# Patient Record
Sex: Female | Born: 1950
Health system: Southern US, Community
[De-identification: ages and names within clinical notes are randomized; demographics above are authoritative.]

## PROBLEM LIST (undated history)

## (undated) DIAGNOSIS — I10 Essential (primary) hypertension: Secondary | ICD-10-CM

## (undated) DIAGNOSIS — G43109 Migraine with aura, not intractable, without status migrainosus: Secondary | ICD-10-CM

## (undated) DIAGNOSIS — K760 Fatty (change of) liver, not elsewhere classified: Secondary | ICD-10-CM

## (undated) DIAGNOSIS — R112 Nausea with vomiting, unspecified: Secondary | ICD-10-CM

## (undated) DIAGNOSIS — D126 Benign neoplasm of colon, unspecified: Secondary | ICD-10-CM

## (undated) DIAGNOSIS — K648 Other hemorrhoids: Secondary | ICD-10-CM

## (undated) DIAGNOSIS — D649 Anemia, unspecified: Secondary | ICD-10-CM

## (undated) DIAGNOSIS — E785 Hyperlipidemia, unspecified: Secondary | ICD-10-CM

## (undated) DIAGNOSIS — K219 Gastro-esophageal reflux disease without esophagitis: Secondary | ICD-10-CM

## (undated) DIAGNOSIS — K31819 Angiodysplasia of stomach and duodenum without bleeding: Secondary | ICD-10-CM

## (undated) DIAGNOSIS — K589 Irritable bowel syndrome without diarrhea: Secondary | ICD-10-CM

## (undated) DIAGNOSIS — B181 Chronic viral hepatitis B without delta-agent: Secondary | ICD-10-CM

## (undated) DIAGNOSIS — T7840XA Allergy, unspecified, initial encounter: Secondary | ICD-10-CM

## (undated) DIAGNOSIS — B001 Herpesviral vesicular dermatitis: Secondary | ICD-10-CM

## (undated) DIAGNOSIS — M519 Unspecified thoracic, thoracolumbar and lumbosacral intervertebral disc disorder: Secondary | ICD-10-CM

## (undated) DIAGNOSIS — Z9889 Other specified postprocedural states: Secondary | ICD-10-CM

## (undated) DIAGNOSIS — Z5189 Encounter for other specified aftercare: Secondary | ICD-10-CM

## (undated) HISTORY — DX: Other hemorrhoids: K64.8

## (undated) HISTORY — DX: Allergy, unspecified, initial encounter: T78.40XA

## (undated) HISTORY — PX: ESOPHAGOGASTRODUODENOSCOPY ENDOSCOPY: SHX5814

## (undated) HISTORY — DX: Angiodysplasia of stomach and duodenum without bleeding: K31.819

## (undated) HISTORY — DX: Fatty (change of) liver, not elsewhere classified: K76.0

## (undated) HISTORY — DX: Migraine with aura, not intractable, without status migrainosus: G43.109

## (undated) HISTORY — PX: NO PAST SURGERIES: SHX2092

## (undated) HISTORY — DX: Chronic viral hepatitis B without delta-agent: B18.1

## (undated) HISTORY — DX: Anemia, unspecified: D64.9

## (undated) HISTORY — DX: Irritable bowel syndrome, unspecified: K58.9

## (undated) HISTORY — DX: Unspecified thoracic, thoracolumbar and lumbosacral intervertebral disc disorder: M51.9

## (undated) HISTORY — DX: Gastro-esophageal reflux disease without esophagitis: K21.9

## (undated) HISTORY — DX: Essential (primary) hypertension: I10

## (undated) HISTORY — DX: Benign neoplasm of colon, unspecified: D12.6

## (undated) HISTORY — DX: Hyperlipidemia, unspecified: E78.5

## (undated) HISTORY — DX: Herpesviral vesicular dermatitis: B00.1

## (undated) HISTORY — DX: Encounter for other specified aftercare: Z51.89

---

## 2000-03-04 ENCOUNTER — Emergency Department (HOSPITAL_COMMUNITY): Admission: EM | Admit: 2000-03-04 | Discharge: 2000-03-05 | Payer: Self-pay | Admitting: Emergency Medicine

## 2000-03-10 ENCOUNTER — Other Ambulatory Visit: Admission: RE | Admit: 2000-03-10 | Discharge: 2000-03-10 | Payer: Self-pay | Admitting: Gynecology

## 2000-08-29 ENCOUNTER — Ambulatory Visit (HOSPITAL_COMMUNITY): Admission: RE | Admit: 2000-08-29 | Discharge: 2000-08-29 | Payer: Self-pay | Admitting: *Deleted

## 2000-08-29 ENCOUNTER — Encounter (INDEPENDENT_AMBULATORY_CARE_PROVIDER_SITE_OTHER): Payer: Self-pay

## 2002-08-13 ENCOUNTER — Encounter: Admission: RE | Admit: 2002-08-13 | Discharge: 2002-08-13 | Payer: Self-pay | Admitting: *Deleted

## 2002-08-13 ENCOUNTER — Encounter: Payer: Self-pay | Admitting: *Deleted

## 2002-09-30 ENCOUNTER — Encounter: Payer: Self-pay | Admitting: Emergency Medicine

## 2002-09-30 ENCOUNTER — Emergency Department (HOSPITAL_COMMUNITY): Admission: EM | Admit: 2002-09-30 | Discharge: 2002-09-30 | Payer: Self-pay | Admitting: Emergency Medicine

## 2002-10-25 ENCOUNTER — Emergency Department (HOSPITAL_COMMUNITY): Admission: EM | Admit: 2002-10-25 | Discharge: 2002-10-25 | Payer: Self-pay

## 2002-10-25 ENCOUNTER — Encounter: Payer: Self-pay | Admitting: Emergency Medicine

## 2003-07-12 ENCOUNTER — Encounter: Admission: RE | Admit: 2003-07-12 | Discharge: 2003-08-02 | Payer: Self-pay | Admitting: Occupational Medicine

## 2003-11-15 ENCOUNTER — Emergency Department (HOSPITAL_COMMUNITY): Admission: EM | Admit: 2003-11-15 | Discharge: 2003-11-16 | Payer: Self-pay | Admitting: Emergency Medicine

## 2004-01-09 ENCOUNTER — Ambulatory Visit (HOSPITAL_COMMUNITY): Admission: RE | Admit: 2004-01-09 | Discharge: 2004-01-09 | Payer: Self-pay | Admitting: *Deleted

## 2004-05-28 ENCOUNTER — Ambulatory Visit: Payer: Self-pay | Admitting: Internal Medicine

## 2004-05-30 ENCOUNTER — Emergency Department (HOSPITAL_COMMUNITY): Admission: EM | Admit: 2004-05-30 | Discharge: 2004-05-30 | Payer: Self-pay | Admitting: Emergency Medicine

## 2004-06-29 ENCOUNTER — Encounter: Admission: RE | Admit: 2004-06-29 | Discharge: 2004-06-29 | Payer: Self-pay | Admitting: Obstetrics and Gynecology

## 2004-08-02 ENCOUNTER — Other Ambulatory Visit: Admission: RE | Admit: 2004-08-02 | Discharge: 2004-08-02 | Payer: Self-pay | Admitting: Obstetrics and Gynecology

## 2004-09-07 ENCOUNTER — Ambulatory Visit (HOSPITAL_COMMUNITY): Admission: RE | Admit: 2004-09-07 | Discharge: 2004-09-07 | Payer: Self-pay | Admitting: Obstetrics and Gynecology

## 2004-10-29 ENCOUNTER — Emergency Department (HOSPITAL_COMMUNITY): Admission: EM | Admit: 2004-10-29 | Discharge: 2004-10-29 | Payer: Self-pay | Admitting: Emergency Medicine

## 2004-11-29 ENCOUNTER — Emergency Department (HOSPITAL_COMMUNITY): Admission: EM | Admit: 2004-11-29 | Discharge: 2004-11-29 | Payer: Self-pay | Admitting: Family Medicine

## 2005-09-18 ENCOUNTER — Ambulatory Visit (HOSPITAL_COMMUNITY): Admission: RE | Admit: 2005-09-18 | Discharge: 2005-09-18 | Payer: Self-pay | Admitting: Obstetrics and Gynecology

## 2005-12-27 ENCOUNTER — Emergency Department (HOSPITAL_COMMUNITY): Admission: EM | Admit: 2005-12-27 | Discharge: 2005-12-27 | Payer: Self-pay | Admitting: Emergency Medicine

## 2006-03-12 ENCOUNTER — Emergency Department (HOSPITAL_COMMUNITY): Admission: EM | Admit: 2006-03-12 | Discharge: 2006-03-12 | Payer: Self-pay | Admitting: Emergency Medicine

## 2006-08-21 ENCOUNTER — Emergency Department (HOSPITAL_COMMUNITY): Admission: EM | Admit: 2006-08-21 | Discharge: 2006-08-21 | Payer: Self-pay | Admitting: Emergency Medicine

## 2006-09-18 ENCOUNTER — Ambulatory Visit: Payer: Self-pay | Admitting: Internal Medicine

## 2006-09-18 LAB — CONVERTED CEMR LAB
ALT: 24 units/L (ref 0–40)
AST: 27 units/L (ref 0–37)
Albumin: 4.1 g/dL (ref 3.5–5.2)
Alkaline Phosphatase: 57 units/L (ref 39–117)
BUN: 15 mg/dL (ref 6–23)
Bacteria, UA: NEGATIVE
Basophils Absolute: 0 10*3/uL (ref 0.0–0.1)
Basophils Relative: 0.1 % (ref 0.0–1.0)
Bilirubin Urine: NEGATIVE
Bilirubin, Direct: 0.1 mg/dL (ref 0.0–0.3)
CO2: 33 meq/L — ABNORMAL HIGH (ref 19–32)
Calcium: 9.2 mg/dL (ref 8.4–10.5)
Chloride: 106 meq/L (ref 96–112)
Cholesterol: 222 mg/dL (ref 0–200)
Creatinine, Ser: 0.8 mg/dL (ref 0.4–1.2)
Crystals: NEGATIVE
Direct LDL: 129.3 mg/dL
Eosinophils Absolute: 0.2 10*3/uL (ref 0.0–0.6)
Eosinophils Relative: 3.5 % (ref 0.0–5.0)
GFR calc Af Amer: 96 mL/min
GFR calc non Af Amer: 80 mL/min
Glucose, Bld: 89 mg/dL (ref 70–99)
HCT: 37.9 % (ref 36.0–46.0)
HDL: 61.8 mg/dL (ref >39.0)
Hemoglobin: 12.6 g/dL (ref 12.0–15.0)
Ketones, ur: NEGATIVE mg/dL
Leukocytes, UA: NEGATIVE
Lymphocytes Relative: 36 % (ref 12.0–46.0)
MCHC: 33.1 g/dL (ref 30.0–36.0)
MCV: 71.8 fL — ABNORMAL LOW (ref 78.0–100.0)
Monocytes Absolute: 0.5 10*3/uL (ref 0.2–0.7)
Monocytes Relative: 10.2 % (ref 3.0–11.0)
Neutro Abs: 2.4 10*3/uL (ref 1.4–7.7)
Neutrophils Relative %: 50.2 % (ref 43.0–77.0)
Nitrite: NEGATIVE
Platelets: 182 10*3/uL (ref 150–400)
Potassium: 3.8 meq/L (ref 3.5–5.1)
RBC: 5.28 M/uL — ABNORMAL HIGH (ref 3.87–5.11)
RDW: 17.3 % — ABNORMAL HIGH (ref 11.5–14.6)
Sodium: 143 meq/L (ref 135–145)
Specific Gravity, Urine: >=1.03 (ref 1.000–1.03)
TSH: 1.21 microintl units/mL (ref 0.35–5.50)
Total Bilirubin: 0.7 mg/dL (ref 0.3–1.2)
Total CHOL/HDL Ratio: 3.6
Total Protein, Urine: NEGATIVE mg/dL
Total Protein: 7.7 g/dL (ref 6.0–8.3)
Triglycerides: 204 mg/dL (ref 0–149)
Urine Glucose: NEGATIVE mg/dL
Urobilinogen, UA: 0.2 (ref 0.0–1.0)
VLDL: 41 mg/dL — ABNORMAL HIGH (ref 0–40)
WBC: 4.8 10*3/uL (ref 4.5–10.5)
pH: 6 (ref 5.0–8.0)

## 2006-09-29 ENCOUNTER — Ambulatory Visit (HOSPITAL_COMMUNITY): Admission: RE | Admit: 2006-09-29 | Discharge: 2006-09-29 | Payer: Self-pay | Admitting: Obstetrics and Gynecology

## 2006-10-01 ENCOUNTER — Ambulatory Visit: Payer: Self-pay | Admitting: Gastroenterology

## 2007-10-06 ENCOUNTER — Ambulatory Visit (HOSPITAL_COMMUNITY): Admission: RE | Admit: 2007-10-06 | Discharge: 2007-10-06 | Payer: Self-pay | Admitting: Obstetrics and Gynecology

## 2007-11-10 ENCOUNTER — Other Ambulatory Visit: Admission: RE | Admit: 2007-11-10 | Discharge: 2007-11-10 | Payer: Self-pay | Admitting: Obstetrics and Gynecology

## 2007-12-17 ENCOUNTER — Telehealth (INDEPENDENT_AMBULATORY_CARE_PROVIDER_SITE_OTHER): Payer: Self-pay | Admitting: *Deleted

## 2008-03-31 ENCOUNTER — Emergency Department (HOSPITAL_COMMUNITY): Admission: EM | Admit: 2008-03-31 | Discharge: 2008-04-01 | Payer: Self-pay | Admitting: *Deleted

## 2008-11-11 ENCOUNTER — Encounter: Payer: Self-pay | Admitting: Internal Medicine

## 2008-11-14 ENCOUNTER — Encounter: Admission: RE | Admit: 2008-11-14 | Discharge: 2008-11-14 | Payer: Self-pay | Admitting: Gastroenterology

## 2009-06-16 ENCOUNTER — Encounter: Payer: Self-pay | Admitting: Internal Medicine

## 2009-07-03 ENCOUNTER — Encounter: Admission: RE | Admit: 2009-07-03 | Discharge: 2009-07-03 | Payer: Self-pay | Admitting: Gastroenterology

## 2009-07-04 ENCOUNTER — Encounter: Payer: Self-pay | Admitting: Internal Medicine

## 2010-03-19 ENCOUNTER — Observation Stay (HOSPITAL_COMMUNITY): Admission: EM | Admit: 2010-03-19 | Discharge: 2010-03-20 | Payer: Self-pay | Admitting: Emergency Medicine

## 2010-03-27 ENCOUNTER — Ambulatory Visit: Payer: Self-pay | Admitting: Internal Medicine

## 2010-03-27 LAB — CONVERTED CEMR LAB
ALT: 22 units/L (ref 0–35)
AST: 23 units/L (ref 0–37)
Albumin: 4.3 g/dL (ref 3.5–5.2)
Alkaline Phosphatase: 72 units/L (ref 39–117)
BUN: 18 mg/dL (ref 6–23)
Basophils Absolute: 0 10*3/uL (ref 0.0–0.1)
Basophils Relative: 0.5 % (ref 0.0–3.0)
Bilirubin Urine: NEGATIVE
Bilirubin, Direct: 0.1 mg/dL (ref 0.0–0.3)
CO2: 32 meq/L (ref 19–32)
Calcium: 10 mg/dL (ref 8.4–10.5)
Chloride: 105 meq/L (ref 96–112)
Cholesterol: 239 mg/dL — ABNORMAL HIGH (ref 0–200)
Creatinine, Ser: 0.9 mg/dL (ref 0.4–1.2)
Direct LDL: 141.5 mg/dL
Eosinophils Absolute: 0.1 10*3/uL (ref 0.0–0.7)
Eosinophils Relative: 2.9 % (ref 0.0–5.0)
GFR calc non Af Amer: 66.85 mL/min (ref >60)
Glucose, Bld: 76 mg/dL (ref 70–99)
HCT: 41.9 % (ref 36.0–46.0)
HDL: 61.7 mg/dL (ref >39.00)
Hemoglobin, Urine: NEGATIVE
Hemoglobin: 14.2 g/dL (ref 12.0–15.0)
Ketones, ur: NEGATIVE mg/dL
Leukocytes, UA: NEGATIVE
Lymphocytes Relative: 40.7 % (ref 12.0–46.0)
Lymphs Abs: 1.7 10*3/uL (ref 0.7–4.0)
MCHC: 33.8 g/dL (ref 30.0–36.0)
MCV: 81.5 fL (ref 78.0–100.0)
Monocytes Absolute: 0.2 10*3/uL (ref 0.1–1.0)
Monocytes Relative: 6 % (ref 3.0–12.0)
Neutro Abs: 2.1 10*3/uL (ref 1.4–7.7)
Neutrophils Relative %: 49.9 % (ref 43.0–77.0)
Nitrite: NEGATIVE
Platelets: 148 10*3/uL — ABNORMAL LOW (ref 150.0–400.0)
Potassium: 5.3 meq/L — ABNORMAL HIGH (ref 3.5–5.1)
RBC: 5.14 M/uL — ABNORMAL HIGH (ref 3.87–5.11)
RDW: 13.5 % (ref 11.5–14.6)
Sodium: 142 meq/L (ref 135–145)
Specific Gravity, Urine: <=1.005 (ref 1.000–1.030)
TSH: 1.15 microintl units/mL (ref 0.35–5.50)
Total Bilirubin: 0.9 mg/dL (ref 0.3–1.2)
Total CHOL/HDL Ratio: 4
Total Protein, Urine: NEGATIVE mg/dL
Total Protein: 7.4 g/dL (ref 6.0–8.3)
Triglycerides: 157 mg/dL — ABNORMAL HIGH (ref 0.0–149.0)
Urine Glucose: NEGATIVE mg/dL
Urobilinogen, UA: 0.2 (ref 0.0–1.0)
VLDL: 31.4 mg/dL (ref 0.0–40.0)
WBC: 4.1 10*3/uL — ABNORMAL LOW (ref 4.5–10.5)
pH: 8 (ref 5.0–8.0)

## 2010-03-28 ENCOUNTER — Ambulatory Visit: Payer: Self-pay | Admitting: Internal Medicine

## 2010-03-28 DIAGNOSIS — E785 Hyperlipidemia, unspecified: Secondary | ICD-10-CM

## 2010-03-28 DIAGNOSIS — B181 Chronic viral hepatitis B without delta-agent: Secondary | ICD-10-CM

## 2010-03-28 DIAGNOSIS — I10 Essential (primary) hypertension: Secondary | ICD-10-CM | POA: Insufficient documentation

## 2010-03-28 DIAGNOSIS — R079 Chest pain, unspecified: Secondary | ICD-10-CM | POA: Insufficient documentation

## 2010-03-28 DIAGNOSIS — K5900 Constipation, unspecified: Secondary | ICD-10-CM | POA: Insufficient documentation

## 2010-03-28 HISTORY — DX: Hyperlipidemia, unspecified: E78.5

## 2010-03-28 HISTORY — DX: Chronic viral hepatitis B without delta-agent: B18.1

## 2010-03-28 HISTORY — DX: Essential (primary) hypertension: I10

## 2010-03-30 ENCOUNTER — Ambulatory Visit: Payer: Self-pay | Admitting: Internal Medicine

## 2010-04-23 ENCOUNTER — Ambulatory Visit: Payer: Self-pay | Admitting: Internal Medicine

## 2010-04-23 LAB — CONVERTED CEMR LAB
ALT: 24 units/L (ref 0–35)
AST: 25 units/L (ref 0–37)
Albumin: 4.4 g/dL (ref 3.5–5.2)
Alkaline Phosphatase: 75 units/L (ref 39–117)
Bilirubin, Direct: 0.1 mg/dL (ref 0.0–0.3)
Cholesterol: 199 mg/dL (ref 0–200)
HDL: 72.4 mg/dL (ref >39.00)
LDL Cholesterol: 106 mg/dL — ABNORMAL HIGH (ref 0–99)
Total Bilirubin: 0.9 mg/dL (ref 0.3–1.2)
Total CHOL/HDL Ratio: 3
Total Protein: 7.3 g/dL (ref 6.0–8.3)
Triglycerides: 105 mg/dL (ref 0.0–149.0)
VLDL: 21 mg/dL (ref 0.0–40.0)

## 2010-07-01 ENCOUNTER — Encounter: Payer: Self-pay | Admitting: Gastroenterology

## 2010-07-01 ENCOUNTER — Encounter: Payer: Self-pay | Admitting: Obstetrics and Gynecology

## 2010-07-12 NOTE — Procedures (Signed)
Summary: [facility]  [facility]   Imported By: Lester Spaulding 07/13/2009 09:55:44  _____________________________________________________________________  External Attachment:    Type:   Image     Comment:   External Document

## 2010-07-12 NOTE — Letter (Signed)
Summary: Out of Work  [facility]  [address] [place], [place] [address]   Phone: [phone]  Fax: [phone]    March 30, 2010   Employee:  Jacqueline Orozco    To Whom It May Concern:   For Medical reasons, please excuse the above named employee from work for the following dates:  Start:   Mar 19, 2010  End:   Apr 02, 2010    ---     to return to work Apr 03, 2010 without restrictions  If you need additional information, please feel free to contact our office.         Sincerely,    Corwin Levins MD

## 2010-07-12 NOTE — Assessment & Plan Note (Signed)
Summary: BACK TO WORK OV--STC   Vital Signs:  Patient profile:   60 year old female Height:      62 inches Weight:      152.75 pounds BMI:     28.04 O2 Sat:      98 % on Room air Temp:     98.2 degrees F oral Pulse rate:   62 / minute BP sitting:   120 / 82  (left arm) Cuff size:   regular  Vitals Entered By: Zella Ball Ewing CMA Duncan Dull) (March 30, 2010 9:56 AM)  O2 Flow:  Room air CC: Discuss returning to work, note/RE   CC:  Discuss returning to work and note/RE.  History of Present Illness: here for f/u after being seen yesterday; she needs to go back to work financially asap, but when she went to work yesterday, she was informed that since the d/c sheet at hospital d/c stated no lifting for 2 wks, she is not to return until oct 25, and on top of that will need note to state she is ok to go back.  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  In retrospect this is liekly the original source of the diffuse left chest pain and strain for which she ended up going to the hosp in the first place.  Working conditions not likely to change but she is willing to go back as she has no other choice.  Does need FMLA form filled out as well.  CP no change from yeterday, but flexeril  does not help adequately.  Pt denies other CP, worsening sob, doe, wheezing, orthopnea, pnd, worsening LE edema, palps, dizziness or syncope .  Pt denies new neuro symptoms such as headache, facial or extremity weakness  No fever, wt loss, night sweats, loss of appetite or other constitutional symptoms Denies wrosening depressive symptoms, suicidal ideation or panic.   Constipation now improved and abd pain resolved.    Problems Prior to Update: 1)  Preventive Health Care  (ICD-V70.0) 2)  Hepatitis B, Chronic  (ICD-070.32) 3)  Constipation  (ICD-564.00) 4)  Chest Pain  (ICD-786.50) 5)  Hypertension  (ICD-401.9) 6)   Hyperlipidemia  (ICD-272.4)  Medications Prior to Update: 1)  Metoprolol Tartrate 25 Mg Tabs (Metoprolol Tartrate) .... 1/2 By Mouth Two Times A Day 2)  Flexeril 5 Mg Tabs (Cyclobenzaprine Hcl) .Marland Kitchen.. 1po Three Times A Day As Needed 3)  Aspir-Low 81 Mg Tbec (Aspirin) .Marland Kitchen.. 1po Once Daily 4)  Simvastatin 20 Mg Tabs (Simvastatin) .Marland Kitchen.. 1po Once Daily 5)  Miralax  Powd (Polyethylene Glycol 3350) .Marland KitchenMarland KitchenMarland Kitchen 17 Gm By Mouth Once Daily Ofr Constipation  Current Medications (verified): 1)  Metoprolol Tartrate 25 Mg Tabs (Metoprolol Tartrate) .... 1/2 By Mouth Two Times A Day 2)  Flexeril 5 Mg Tabs (Cyclobenzaprine Hcl) .Marland Kitchen.. 1po Three Times A Day As Needed 3)  Aspir-Low 81 Mg Tbec (Aspirin) .Marland Kitchen.. 1po Once Daily 4)  Simvastatin 20 Mg Tabs (Simvastatin) .Marland Kitchen.. 1po Once Daily 5)  Miralax  Powd (Polyethylene Glycol 3350) .Marland KitchenMarland KitchenMarland Kitchen 17 Gm By Mouth Once Daily Ofr Constipation 6)  Tramadol Hcl 50 Mg Tabs (Tramadol Hcl) .Marland Kitchen.. 1po Q 6 Hrs As Needed Pain  Allergies (verified): 1)  ! Pcn 2)  ! Asa 3)  ! Streptomycin  Past History:  Past Medical History: Last updated: 03/28/2010 Hyperlipidemia Hypertension hx of hepatitis - chronic hep B  Past Surgical History: Last updated: 03/28/2010 Denies surgical history  Social History: Last updated: 03/28/2010 Married Never Smoked Alcohol use-no 6 children work - [employer]  Risk Factors: Smoking Status: never (03/28/2010)  Review of Systems       all otherwise negative per pt -    Physical Exam  General:  alert and well-developed.   Head:  normocephalic and atraumatic.   Eyes:  vision grossly intact, pupils equal, and pupils round.   Ears:  R ear normal and L ear normal.   Nose:  no external deformity and no nasal discharge.   Mouth:  no gingival abnormalities and pharynx pink and moist.   Neck:  supple and no masses.   Lungs:  normal respiratory effort and normal breath sounds.   Heart:  normal rate and regular rhythm.   Msk:  no joint tenderness and no joint  swelling.  , left lateral chest wall with mild tenderness diffusely but no sweling, rash, erythema Extremities:  no edema, no erythema    Impression & Recommendations:  Problem # 1:  CHEST PAIN (ICD-786.50) improved, but still painful;  exam c/w msk as  before; ok for tramadol as needed inaddition to the flexeril, ok to return to work oct 25 as planned without restriction though she should allow other co-workers to do their share of the heavier lifitng;  note for work, and Transport planner form filled out  Problem # 2:  HYPERTENSION (ICD-401.9)  Her updated medication list for this problem includes:    Metoprolol Tartrate 25 Mg Tabs (Metoprolol tartrate) .Marland Kitchen... 1/2 by mouth two times a day  BP today: 120/82 Prior BP: 110/72 (03/28/2010)  Labs Reviewed: K+: 5.3 (03/27/2010) Creat: : 0.9 (03/27/2010)   Chol: 239 (03/27/2010)   HDL: 61.70 (03/27/2010)   LDL: DEL (09/18/2006)   TG: 157.0 (03/27/2010) stable overall by hx and exam, ok to continue meds/tx as is   Problem # 3:  CONSTIPATION (ICD-564.00)  Her updated medication list for this problem includes:    Miralax Powd (Polyethylene glycol 3350) .Marland KitchenMarland KitchenMarland KitchenMarland Kitchen 17 gm by mouth once daily ofr constipation some improved;  Continue all previous medications as before this visit   Complete Medication List: 1)  Metoprolol Tartrate 25 Mg Tabs (Metoprolol tartrate) .... 1/2 by mouth two times a day 2)  Flexeril 5 Mg Tabs (Cyclobenzaprine hcl) .Marland Kitchen.. 1po three times a day as needed 3)  Aspir-low 81 Mg Tbec (Aspirin) .Marland Kitchen.. 1po once daily 4)  Simvastatin 20 Mg Tabs (Simvastatin) .Marland Kitchen.. 1po once daily 5)  Miralax Powd (Polyethylene glycol 3350) .Marland KitchenMarland Kitchen. 17 gm by mouth once daily ofr constipation 6)  Tramadol Hcl 50 Mg Tabs (Tramadol hcl) .Marland Kitchen.. 1po q 6 hrs as needed pain  Patient Instructions: 1)  You are given the form to return to work Oct 25 without restriction 2)  Please take all new medications as prescribed - the pain medicine 3)  Continue all previous medications as  before this visit , including the muscle relaxer as needed 4)  Your FMLA form was filled out today 5)  Please followup a per instructions at your last visit Prescriptions: TRAMADOL HCL 50 MG TABS (TRAMADOL HCL) 1po q 6 hrs as needed pain  #60 x 2   Entered and Authorized by:   Corwin Levins MD   Signed by:   Corwin Levins MD on 03/30/2010   Method used:   Print then Give to Patient   RxID:   [id number]    Orders Added: 1)  Est.  Patient Level IV [[address]]

## 2010-07-12 NOTE — Procedures (Signed)
Summary: [facility]  [facility]   Imported By: Lester [place] 07/13/2009 09:54:31  _____________________________________________________________________  External Attachment:    Type:   Image     Comment:   External Document

## 2010-07-12 NOTE — Letter (Signed)
Summary: [facility]   Imported By: Lester [place] 06/22/2009 12:06:24  _____________________________________________________________________  External Attachment:    Type:   Image     Comment:   External Document

## 2010-07-12 NOTE — Assessment & Plan Note (Signed)
Summary: CPX/PAIN ON LEFT SIDE/LB   Vital Signs:  Patient profile:   60 year old female Height:      62 inches Weight:      150.50 pounds BMI:     27.63 O2 Sat:      96 % on Room air Temp:     98.3 degrees F oral Pulse rate:   69 / minute BP sitting:   110 / 72  (left arm) Cuff size:   regular  Vitals Entered By: Zella Ball Ewing CMA Duncan Dull) (March 28, 2010 2:24 PM)  O2 Flow:  Room air  Preventive Care Screening  Colonoscopy:    Date:  06/16/2009    Next Due:  06/2014    Results:  Hyperplastic Polyp      declines tetanus, or flu shot today  CC: Adult Physical/RE   CC:  Adult Physical/RE.  History of Present Illness: here for wellness - last seen 2008,  and f/u left chest pain for which she was recently seen in ER with neg evaluation and released home with low dose metoprolol for HTN, as well as flexeril for MSK pain;  states meds helping, but still with sharp left chest pain rather diffuse in area pointing to the left lower costal margin, left entire side, and somewhat post chest at the lower thoracic levels;  no rash, swelling, erythema, trauma, injury, fever, cough, sob, doe but pain is worse to inspiration and twisting left and right at the waise, as well as some assoc with increased recurrent constipation it seems. Pain not exertional, not assoc with diaphoresis, n/v, sob, palp or syncope or dizziness.  Does radiate towards the shoulder and arm but no shoulder or arm pain.   Perform's all ADL's, no fall risk,  home safety reviewed, tryign to follow lower chol diet,    Preventive Screening-Counseling & Management  Alcohol-Tobacco     Smoking Status: never  Problems Prior to Update: 1)  Constipation  (ICD-564.00) 2)  Chest Pain  (ICD-786.50) 3)  Hypertension  (ICD-401.9) 4)  Hyperlipidemia  (ICD-272.4)  Medications Prior to Update: 1)  Omeprazole 20 Mg Cpdr (Omeprazole) .... Take 2 Capsule By Mouth Once A Day  Current Medications (verified): 1)  Metoprolol Tartrate 25  Mg Tabs (Metoprolol Tartrate) .... 1/2 By Mouth Two Times A Day 2)  Flexeril 5 Mg Tabs (Cyclobenzaprine Hcl) .Marland Kitchen.. 1po Three Times A Day As Needed 3)  Aspir-Low 81 Mg Tbec (Aspirin) .Marland Kitchen.. 1po Once Daily 4)  Simvastatin 20 Mg Tabs (Simvastatin) .Marland Kitchen.. 1po Once Daily 5)  Miralax  Powd (Polyethylene Glycol 3350) .Marland KitchenMarland KitchenMarland Kitchen 17 Gm By Mouth Once Daily Ofr Constipation  Allergies (verified): 1)  ! Pcn 2)  ! Asa 3)  ! 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 no heart disease  Social History: Reviewed history and no changes required. 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         all otherwise negative per pt -    Physical Exam  General:  alert and well-developed.   Head:  normocephalic and atraumatic.   Eyes:  vision grossly intact, pupils equal, and pupils round.   Ears:  R ear normal and L ear normal.   Nose:  no external deformity and no nasal discharge.   Mouth:  no gingival abnormalities and pharynx pink and moist.   Neck:  supple and no masses.   Lungs:  normal respiratory effort and normal breath sounds.     Heart:  normal rate and regular rhythm.   Abdomen:  soft, non-tender, normal bowel sounds, no distention, no guarding, no hepatomegaly, and no splenomegaly.   Msk:  no joint tenderness and no joint swelling.  , left lateral chest wall with mild tenderness diffusely but no sweling, rash, erythema Extremities:  no edema, no erythema  Neurologic:  cranial nerves II-XII intact and strength normal in all extremities.   Skin:  color normal and no rashes.   Psych:  not depressed appearing and moderately anxious.     Impression & Recommendations:  Problem # 1:  Preventive Health Care (ICD-V70.0) Overall doing well, age appropriate education and counseling updated, referral for preventive services and immunizations addressed, dietary counseling and smoking status adressed , most recent labs reviewed with pt, ecg declined per pt   I have personally reviewed the Medicare Annual Wellness questionnaire and have noted 1.   The patient's medical and social history 2.   Their use of alcohol, tobacco or illicit drugs 3.   Their current medications and supplements 4.   The patient's functional ability including ADL's, fall risks, home safety risks and hearing or visual             impairment. 5.   Diet and physical activities 6.   Evidence for depression or mood disorders  The patients weight, height, BMI  have been recorded in the chart I have made referrals, counseling and provided education to the patient based review of the above   Problem # 2:  CHEST PAIN (ICD-786.50) Assessment: Comment Only left diffuse - c/w MSK most likely - for tylenol as needed   Problem # 3:  HYPERTENSION (ICD-401.9)  Her updated medication list for this problem includes:    Metoprolol Tartrate 25 Mg Tabs (Metoprolol tartrate) .Marland Kitchen... 1/2 by mouth two times a day improved  - Continue all previous medications as before this visit   BP today: 110/72  Labs Reviewed: K+: 5.3 (03/27/2010) Creat: : 0.9 (03/27/2010)   Chol:  239 (03/27/2010)   HDL: 61.70 (03/27/2010)   LDL: DEL (09/18/2006)   TG: 157.0 (03/27/2010)  Problem # 4:  HYPERLIPIDEMIA (ICD-272.4)  Labs Reviewed: SGOT: 23 (03/27/2010)   SGPT: 22 (03/27/2010)   HDL:61.70 (03/27/2010), 61.8 (09/18/2006)  LDL:DEL (09/18/2006)  Chol:239 (03/27/2010), 222 (09/18/2006)  Trig:157.0 (03/27/2010), 204 (09/18/2006) to start simvsastatin 20 once daily , Pt to continue diet efforts,  to check labs 4 wks  Her updated medication list for this problem includes:    Simvastatin 20 Mg Tabs (Simvastatin) .Marland Kitchen... 1po once daily  Problem # 5:  CONSTIPATION (ICD-564.00)  for daily miralax as needed   Her updated medication list for this problem includes:    Miralax Powd (Polyethylene glycol 3350) .Marland KitchenMarland KitchenMarland KitchenMarland Kitchen 17 gm by mouth once daily ofr constipation  Complete Medication List: 1)  Metoprolol Tartrate 25 Mg Tabs (Metoprolol tartrate) .... 1/2  by mouth two times a day 2)  Flexeril 5 Mg Tabs (Cyclobenzaprine hcl) .Marland Kitchen.. 1po three times a day as needed 3)  Aspir-low 81 Mg Tbec (Aspirin) .Marland Kitchen.. 1po once daily 4)  Simvastatin 20 Mg Tabs (Simvastatin) .Marland Kitchen.. 1po once daily 5)  Miralax Powd (Polyethylene glycol 3350) .Marland KitchenMarland Kitchen. 17 gm by mouth once daily ofr constipation  Patient Instructions: 1)  Please take all new medications as prescribed  - the simvastatin 20 mg for cholesterol 2)  you can also take Tylenol arthritis three times a day for pain 3)  Continue all previous medications as before this visit , including the metoprolol 4)  Take an Aspirin every day - 81 mg - 1 per day - COATED only 5)  Please followup for LAB only in 4 wks: 6)  Hepatic Panel prior to visit, ICD-9: v58.69 7)  Lipid Panel prior to visit, ICD-9:272.0 8)  you should also take miralax for constipation - 17 gm in water per day 9)  Please schedule a follow-up appointment in 1 year, or sooner if needed Prescriptions: METOPROLOL TARTRATE 25 MG TABS (METOPROLOL TARTRATE) 1/2 by mouth two times a day  #90 x 3   Entered  and Authorized by:   Corwin Levins MD   Signed by:   Corwin Levins MD on 03/28/2010   Method used:   Print then Give to Patient   RxID:   [id number] SIMVASTATIN 20 MG TABS (SIMVASTATIN) 1po once daily  #90 x 3   Entered and Authorized by:   Corwin Levins MD   Signed by:   Corwin Levins MD on 03/28/2010   Method used:   Print then Give to Patient   RxID:   [id number] SIMVASTATIN 40 MG TABS (SIMVASTATIN) 1po once daily  #90 x 3   Entered and Authorized by:   Corwin Levins MD   Signed by:   Corwin Levins MD on 03/28/2010   Method used:   Print then Give to Patient   RxID:   [id number]    Orders Added: 1)  New Patient 40-64 years [29518]

## 2010-08-16 ENCOUNTER — Other Ambulatory Visit: Payer: BC Managed Care – PPO

## 2010-08-16 ENCOUNTER — Other Ambulatory Visit: Payer: Self-pay | Admitting: Internal Medicine

## 2010-08-16 ENCOUNTER — Encounter: Payer: Self-pay | Admitting: Internal Medicine

## 2010-08-16 ENCOUNTER — Ambulatory Visit (INDEPENDENT_AMBULATORY_CARE_PROVIDER_SITE_OTHER): Payer: BC Managed Care – PPO | Admitting: Internal Medicine

## 2010-08-16 DIAGNOSIS — I10 Essential (primary) hypertension: Secondary | ICD-10-CM

## 2010-08-16 DIAGNOSIS — E785 Hyperlipidemia, unspecified: Secondary | ICD-10-CM

## 2010-08-16 DIAGNOSIS — R109 Unspecified abdominal pain: Secondary | ICD-10-CM

## 2010-08-16 LAB — URINALYSIS, ROUTINE W REFLEX MICROSCOPIC
Bilirubin Urine: NEGATIVE
Hgb urine dipstick: NEGATIVE
Nitrite: NEGATIVE
Total Protein, Urine: NEGATIVE
pH: 6 (ref 5.0–8.0)

## 2010-08-16 LAB — HEPATIC FUNCTION PANEL
ALT: 32 U/L (ref 0–35)
Albumin: 4.2 g/dL (ref 3.5–5.2)
Alkaline Phosphatase: 65 U/L (ref 39–117)
Total Protein: 7.2 g/dL (ref 6.0–8.3)

## 2010-08-16 LAB — CBC WITH DIFFERENTIAL/PLATELET
Basophils Absolute: 0 10*3/uL (ref 0.0–0.1)
Basophils Relative: 0.3 % (ref 0.0–3.0)
Eosinophils Absolute: 0.2 10*3/uL (ref 0.0–0.7)
Eosinophils Relative: 3.3 % (ref 0.0–5.0)
HCT: 39.6 % (ref 36.0–46.0)
Hemoglobin: 13.4 g/dL (ref 12.0–15.0)
Lymphocytes Relative: 36.5 % (ref 12.0–46.0)
Lymphs Abs: 2 10*3/uL (ref 0.7–4.0)
MCHC: 33.9 g/dL (ref 30.0–36.0)
MCV: 82.2 fl (ref 78.0–100.0)
Monocytes Relative: 7.4 % (ref 3.0–12.0)
Neutro Abs: 2.9 10*3/uL (ref 1.4–7.7)
Platelets: 147 10*3/uL — ABNORMAL LOW (ref 150.0–400.0)
RBC: 4.82 Mil/uL (ref 3.87–5.11)
RDW: 13.9 % (ref 11.5–14.6)
WBC: 5.6 10*3/uL (ref 4.5–10.5)

## 2010-08-16 LAB — BASIC METABOLIC PANEL
CO2: 29 mEq/L (ref 19–32)
Calcium: 9.1 mg/dL (ref 8.4–10.5)
Chloride: 105 mEq/L (ref 96–112)
Creatinine, Ser: 1 mg/dL (ref 0.4–1.2)
GFR: 59.94 mL/min — ABNORMAL LOW (ref >60.00)
Potassium: 3.9 mEq/L (ref 3.5–5.1)
Sodium: 142 mEq/L (ref 135–145)

## 2010-08-16 LAB — AMYLASE: Amylase: 80 U/L (ref 27–131)

## 2010-08-17 LAB — H. PYLORI ANTIBODY, IGG: H Pylori IgG: NEGATIVE

## 2010-08-20 ENCOUNTER — Telehealth: Payer: Self-pay | Admitting: Internal Medicine

## 2010-08-21 ENCOUNTER — Other Ambulatory Visit: Payer: Self-pay | Admitting: Internal Medicine

## 2010-08-21 DIAGNOSIS — R109 Unspecified abdominal pain: Secondary | ICD-10-CM

## 2010-08-21 NOTE — Assessment & Plan Note (Signed)
Summary: D/T---UPPER AND LOWER ABDOMINAL PAIN---STC   Vital Signs:  Patient profile:   60 year old female Height:      62 inches Weight:      153.25 pounds BMI:     28.13 O2 Sat:      95 % on Room air Temp:     98.8 degrees F oral Pulse rate:   76 / minute BP sitting:   118 / 80  (left arm) Cuff size:   regular  Vitals Entered By: Margaret Pyle, CMA (August 16, 2010 1:46 PM)  O2 Flow:  Room air CC: Abd pain 2-3 months, mild relief with BM   CC:  Abd pain 2-3 months and mild relief with BM.  History of Present Illness: here with 2-3 mo gradually increased abd pain, mostly to the upper abd, often wakes her up at 3am, sometimes some lower pan;  thinks having BM's may make it better;  some nausea, no vomiting, does have near dialy sour reflux symptoms 9better with OTC tums) ;  has very infreq dysphagia with hangup at the upper abd level it seems, but only once per wk;  nowt loss, (in fact c/o wt gain),  has some radiation of pain to the back, and nothing else seems to make better or worse;  no blood;   Last colonsocpy jan 2011 with hyperplastic polyp.. Does have alternating constipation and diarrhea - more in the last 2-3 months.  Pt denies CP, worsening sob, doe, wheezing, orthopnea, pnd, worsening LE edema, palps, dizziness or syncope  Pt denies new neuro symptoms such as headache, facial or extremity weakness  Pt denies polydipsia, polyuria, Overall good compliance with meds, trying to follow low chol, DM diet, wt stable, little excercise however  Overall good compliance with meds, and good tolerability.  No fever, wt loss, night sweats, loss of appetite or other constitutional symptoms Did have HA with sneeze and itch and URI symptoms last wk, better now.  Denies worsening depressive symptoms, suicidal ideation, or panic, htough feels under stress , especially with coworkers who try to coerce her to do "things"  and she plans to contact lawyer next time.   No GU symptoms such as  dysuria, freq, urgency  Preventive Screening-Counseling & Management      Drug Use:  no.    Problems Prior to Update: 1)  Abdominal Pain Other Specified Site  (ICD-789.09) 2)  Preventive Health Care  (ICD-V70.0) 3)  Hepatitis B, Chronic  (ICD-070.32) 4)  Constipation  (ICD-564.00) 5)  Chest Pain  (ICD-786.50) 6)  Hypertension  (ICD-401.9) 7)  Hyperlipidemia  (ICD-272.4)  Medications Prior to Update: 1)  Metoprolol Tartrate 25 Mg Tabs (Metoprolol Tartrate) .... 1/2 By Mouth Two Times A Day 2)  Flexeril 5 Mg Tabs (Cyclobenzaprine Hcl) .Marland Kitchen.. 1po Three Times A Day As Needed 3)  Aspir-Low 81 Mg Tbec (Aspirin) .Marland Kitchen.. 1po Once Daily 4)  Simvastatin 20 Mg Tabs (Simvastatin) .Marland Kitchen.. 1po Once Daily 5)  Miralax  Powd (Polyethylene Glycol 3350) .Marland KitchenMarland KitchenMarland Kitchen 17 Gm By Mouth Once Daily Ofr Constipation 6)  Tramadol Hcl 50 Mg Tabs (Tramadol Hcl) .Marland Kitchen.. 1po Q 6 Hrs As Needed Pain  Current Medications (verified): 1)  Metoprolol Tartrate 25 Mg Tabs (Metoprolol Tartrate) .... 1/2 By Mouth Two Times A Day 2)  Flexeril 5 Mg Tabs (Cyclobenzaprine Hcl) .Marland Kitchen.. 1po Three Times A Day As Needed 3)  Aspir-Low 81 Mg Tbec (Aspirin) .Marland Kitchen.. 1po Once Daily 4)  Miralax  Powd (Polyethylene Glycol 3350) .Marland KitchenMarland KitchenMarland Kitchen 17 Gm  By Mouth Once Daily Ofr Constipation 5)  Tramadol Hcl 50 Mg Tabs (Tramadol Hcl) .Marland Kitchen.. 1po Q 6 Hrs As Needed Pain 6)  Pantoprazole Sodium 40 Mg Tbec (Pantoprazole Sodium) .Marland Kitchen.. 1po Once Daily 7)  Dicyclomine Hcl 10 Mg Caps (Dicyclomine Hcl) .Marland Kitchen.. 1 By Mouth Q 8 Hrs As Needed Abd Pain / Spasms  Allergies (verified): 1)  ! Pcn 2)  ! Asa 3)  ! 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         all otherwise negative per pt -    Physical Exam  General:  alert and well-developed.   Head:  normocephalic and atraumatic.   Eyes:  vision grossly intact, pupils equal, and pupils round.   Ears:  R ear normal and L ear normal.   Nose:  no external deformity and no nasal discharge.   Mouth:  no gingival abnormalities and pharynx pink and moist.   Neck:  supple and no masses.   Lungs:  normal respiratory effort and normal breath sounds.   Heart:  normal rate and regular rhythm.   Abdomen:  soft, non-tender, normal bowel sounds, no distention, no guarding, no hepatomegaly, and no splenomegaly.  except for mild epigastric tender Msk:  no joint tenderness and no joint swelling.   Extremities:  no edema, no erythema  Neurologic:  strength normal in all extremities and gait normal.   Psych:  dysphoric affect and moderately anxious.     Impression & Recommendations:  Problem # 1:  ABDOMINAL PAIN OTHER SPECIFIED SITE (ICD-789.09)  Her updated medication list for this problem includes:    Flexeril 5 Mg Tabs (Cyclobenzaprine hcl) .Marland Kitchen... 1po three times a day as needed    Aspir-low 81 Mg Tbec (Aspirin) .Marland Kitchen... 1po once daily    Tramadol Hcl 50 Mg Tabs (Tramadol hcl) .Marland Kitchen... 1po q 6 hrs as needed pain primarily right and upper mid and epigastric area;  exam with  mild tender today but o/w benign;  I suspect  primarily funcitonal issue such as IBS, but clearly has GERD, and cant r/o other such drug side effect (statin), gastritis, or PUD or other;  today will hold statin, check labs and abd u/s;  start PPI, and bentyl as needed,  with f/u in 2 wks  Orders: Radiology Referral (Radiology) TLB-BMP (Basic Metabolic Panel-BMET) (80048-METABOL) TLB-CBC Platelet - w/Differential (85025-CBCD) TLB-Hepatic/Liver Function Pnl (80076-HEPATIC) TLB-Lipase (83690-LIPASE) TLB-Amylase (82150-AMYL) TLB-Udip w/ Micro (81001-URINE)  Problem # 2:  HYPERLIPIDEMIA (ICD-272.4)  The following medications were removed from the medication list:    Simvastatin 20 Mg Tabs (Simvastatin) .Marland Kitchen... 1po once daily  Labs Reviewed: SGOT: 25 (04/23/2010)   SGPT: 24 (04/23/2010)   HDL:72.40 (04/23/2010), 61.70 (03/27/2010)  LDL:106 (04/23/2010), DEL (09/18/2006)  Chol:199 (04/23/2010), 239 (03/27/2010)  Trig:105.0 (04/23/2010), 157.0 (03/27/2010) d/w pt - to hold as above, consdier change statin next visit, .follow lower chol diet  Problem # 3:  HYPERTENSION (ICD-401.9)  Her updated medication list for this problem includes:    Metoprolol Tartrate 25 Mg Tabs (Metoprolol tartrate) .Marland Kitchen... 1/2 by mouth two times a day  BP today: 118/80 Prior BP: 120/82 (03/30/2010)  Labs Reviewed: K+: 5.3 (03/27/2010) Creat: : 0.9 (03/27/2010)   Chol: 199 (04/23/2010)   HDL: 72.40 (04/23/2010)   LDL: 106 (04/23/2010)   TG: 105.0 (04/23/2010) stable overall by hx and exam, ok to continue meds/tx as is   Complete Medication List: 1)  Metoprolol Tartrate 25 Mg Tabs (Metoprolol tartrate) .... 1/2 by mouth two times a day 2)  Flexeril 5 Mg Tabs (Cyclobenzaprine hcl) .Marland Kitchen.. 1po three times a day as needed 3)  Aspir-low 81 Mg Tbec (Aspirin) .Marland Kitchen.. 1po once daily 4)  Miralax Powd (Polyethylene glycol 3350) .Marland KitchenMarland Kitchen. 17 gm by mouth once daily ofr constipation 5)  Tramadol Hcl 50 Mg Tabs (Tramadol hcl) .Marland Kitchen.. 1po q 6  hrs as needed pain 6)  Pantoprazole Sodium 40 Mg Tbec (Pantoprazole sodium) .Marland Kitchen.. 1po once daily 7)  Dicyclomine Hcl 10 Mg Caps (Dicyclomine hcl) .Marland Kitchen.. 1 by mouth q 8 hrs as needed abd pain / spasms  Patient Instructions: 1)  Please hold on taking the simvstatin for cholesterol for now 2)  Please take all new medications as prescribed - the medication for acid reflux (pantoprazole 40 mg), and generic Bentyl for as needed use for spasms;  you may wish to use stool softner such as Colace 100 mg two times a day for times of constipation (but be aware this can lead to loose stool if this is used) 3)  Please go to the Lab in the basement for your blood and/or urine tests today 4)  You will be contacted about the referral(s) to: abdomen ultrasound (to check the gallbladder) 5)  Please schedule a follow-up appointment in 2 weeks. Prescriptions: DICYCLOMINE HCL 10 MG CAPS (DICYCLOMINE HCL) 1 by mouth q 8 hrs as needed abd pain / spasms  #60 x 1   Entered and Authorized by:   Corwin Levins MD   Signed by:   Corwin Levins MD on 08/16/2010   Method used:   Electronically to        [facility]  [address] [phone]* (retail)       [address]       [place], [place]  [address]       Ph: [phone]       Fax: [phone]   RxID:   [id number] PANTOPRAZOLE SODIUM 40 MG TBEC (PANTOPRAZOLE SODIUM) 1po once daily  #30 x 11   Entered and Authorized by:   Corwin Levins MD   Signed by:   Corwin Levins MD on 08/16/2010   Method used:   Electronically to        [facility]  [address] [phone]* (retail)       [address]       [place], [place]  [address]       Ph: [phone]       Fax: [phone]   RxID:   [id number]    Orders Added: 1)  Radiology Referral [Radiology] 2)  TLB-BMP (Basic Metabolic Panel-BMET) [80048-METABOL] 3)  TLB-CBC Platelet - w/Differential [85025-CBCD]  4)  TLB-Hepatic/Liver Function Pnl [80076-HEPATIC] 5)  TLB-Lipase [83690-LIPASE] 6)   TLB-Amylase [82150-AMYL] 7)  TLB-Udip w/ Micro [81001-URINE] 8)  Est. Patient Level V [16109]

## 2010-08-22 LAB — COMPREHENSIVE METABOLIC PANEL
AST: 27 U/L (ref 0–37)
Albumin: 4.1 g/dL (ref 3.5–5.2)
Alkaline Phosphatase: 75 U/L (ref 39–117)
CO2: 25 mEq/L (ref 19–32)
Chloride: 108 mEq/L (ref 96–112)
GFR calc Af Amer: >60 mL/min (ref >60)
GFR calc non Af Amer: >60 mL/min (ref >60)
Potassium: 3.7 mEq/L (ref 3.5–5.1)
Total Bilirubin: 1 mg/dL (ref 0.3–1.2)

## 2010-08-22 LAB — POCT CARDIAC MARKERS
CKMB, poc: 2.7 ng/mL (ref 1.0–8.0)
Myoglobin, poc: 47.8 ng/mL (ref 12–200)
Troponin i, poc: <0.05 ng/mL (ref 0.00–0.09)

## 2010-08-22 LAB — D-DIMER, QUANTITATIVE: D-Dimer, Quant: <0.22 ug/mL-FEU (ref 0.00–0.48)

## 2010-08-22 LAB — CK TOTAL AND CKMB (NOT AT ARMC)
CK, MB: 5.8 ng/mL — ABNORMAL HIGH (ref 0.3–4.0)
Relative Index: 2.6 — ABNORMAL HIGH (ref 0.0–2.5)
Relative Index: 3 — ABNORMAL HIGH (ref 0.0–2.5)

## 2010-08-22 LAB — TROPONIN I: Troponin I: 0.02 ng/mL (ref 0.00–0.06)

## 2010-08-22 LAB — CBC
Hemoglobin: 14 g/dL (ref 12.0–15.0)
MCH: 27 pg (ref 26.0–34.0)
MCV: 80.5 fL (ref 78.0–100.0)
Platelets: 161 10*3/uL (ref 150–400)
RBC: 5.19 MIL/uL — ABNORMAL HIGH (ref 3.87–5.11)
WBC: 4.8 10*3/uL (ref 4.0–10.5)

## 2010-08-22 LAB — PROTIME-INR: Prothrombin Time: 13.4 seconds (ref 11.6–15.2)

## 2010-08-22 LAB — TSH: TSH: 1.635 u[IU]/mL (ref 0.350–4.500)

## 2010-08-22 LAB — LIPID PANEL
LDL Cholesterol: 144 mg/dL — ABNORMAL HIGH (ref 0–99)
VLDL: 17 mg/dL (ref 0–40)

## 2010-08-27 ENCOUNTER — Ambulatory Visit
Admission: RE | Admit: 2010-08-27 | Discharge: 2010-08-27 | Disposition: A | Discharge status: Home / Self Care | Payer: BC Managed Care – PPO | Source: Ambulatory Visit | Attending: Internal Medicine | Admitting: Internal Medicine

## 2010-08-27 DIAGNOSIS — R109 Unspecified abdominal pain: Secondary | ICD-10-CM

## 2010-08-28 NOTE — Progress Notes (Signed)
Summary: Pharmacy change  Phone Note Call from Patient Call back at Home Phone [phone]   Summary of Call: Pt's husband left vm req a call back.  Initial call taken by: Lamar Sprinkles, CMA,  August 20, 2010 10:59 AM  Follow-up for Phone Call        Spouse called requesting Rxs from OV be sent to [facility] [address]  Follow-up by: Margaret Pyle, CMA,  August 20, 2010 11:05 AM    Prescriptions: DICYCLOMINE HCL 10 MG CAPS (DICYCLOMINE HCL) 1 by mouth q 8 hrs as needed abd pain / spasms  #60 x 1   Entered by:   Margaret Pyle, CMA   Authorized by:   Corwin Levins MD   Signed by:   Margaret Pyle, CMA on 08/20/2010   Method used:   Electronically to        [facility] [phone]* (retail)       [address]       [place], [place]  [address]       Ph: [phone]       Fax: [phone]   RxID:   [id number] PANTOPRAZOLE SODIUM 40 MG TBEC (PANTOPRAZOLE SODIUM) 1po once daily  #30 x 11   Entered by:   Margaret Pyle, CMA   Authorized by:   Corwin Levins MD   Signed by:   Margaret Pyle, CMA on 08/20/2010   Method used:   Electronically to        [facility] [phone]* (retail)       [address]       [place], [place]  [address]       Ph: [phone]       Fax: [phone]   RxID:   [id number]

## 2010-08-30 ENCOUNTER — Encounter: Payer: Self-pay | Admitting: Internal Medicine

## 2010-08-30 ENCOUNTER — Ambulatory Visit (INDEPENDENT_AMBULATORY_CARE_PROVIDER_SITE_OTHER): Payer: BC Managed Care – PPO | Admitting: Internal Medicine

## 2010-08-30 DIAGNOSIS — E785 Hyperlipidemia, unspecified: Secondary | ICD-10-CM

## 2010-08-30 DIAGNOSIS — K5900 Constipation, unspecified: Secondary | ICD-10-CM

## 2010-08-30 DIAGNOSIS — I10 Essential (primary) hypertension: Secondary | ICD-10-CM

## 2010-08-30 DIAGNOSIS — Z0000 Encounter for general adult medical examination without abnormal findings: Secondary | ICD-10-CM

## 2010-08-30 DIAGNOSIS — R109 Unspecified abdominal pain: Secondary | ICD-10-CM

## 2010-08-30 NOTE — Assessment & Plan Note (Signed)
To re-start the miralax daily , o/w exam benign today

## 2010-08-30 NOTE — Assessment & Plan Note (Signed)
stable overall by hx and exam, ok to continue meds/tx as is ;   Lab Results  Component Value Date   CHOL 199 04/23/2010   CHOL 239* 03/27/2010   CHOL  Value: 238        ATP III CLASSIFICATION:  <200     mg/dL   Desirable  951-884  mg/dL   Borderline High  >=166    mg/dL   High       * 12/08/1599   Lab Results  Component Value Date   HDL 72.40 04/23/2010   HDL 61.70 03/27/2010   HDL 77 03/19/2010   Lab Results  Component Value Date   LDLCALC 106* 04/23/2010   LDLCALC  Value: 144        Total Cholesterol/HDL:CHD Risk Coronary Heart Disease Risk Table                     Men   Women  1/2 Average Risk   3.4   3.3  Average Risk       5.0   4.4  2 X Average Risk   9.6   7.1  3 X Average Risk  23.4   11.0        Use the calculated Patient Ratio above and the CHD Risk Table to determine the patient's CHD Risk.        ATP III CLASSIFICATION (LDL):  <100     mg/dL   Optimal  093-235  mg/dL   Near or Above                    Optimal  130-159  mg/dL   Borderline  573-220  mg/dL   High  >254     mg/dL   Very High* 27/11/2374   Lab Results  Component Value Date   TRIG 105.0 04/23/2010   TRIG 157.0* 03/27/2010   TRIG 86 03/19/2010   Lab Results  Component Value Date   CHOLHDL 3 04/23/2010   CHOLHDL 4 03/27/2010   CHOLHDL 3.1 03/19/2010

## 2010-08-30 NOTE — Progress Notes (Signed)
Here to f/u;   as further hx today indicates she was not actually taking the omeprazole prior to last visit;  In fact started the pantoprazole and seemed to improve at least mildly "a little bit better."  Tramadol too strong - with nausea and dizziness.  Has been taking the bentyl at one qhs due to sleepiness - did seem to help at night.  Not taking the miralax and having some increased constipation lately - did work ok before.  BM also helps the abd pain as well.   Pt denies chest pain, increased sob or doe, wheezing, orthopnea, PND, increased LE swelling, palpitations, dizziness or syncope.    Pt denies new neurological symptoms such as new headache, or facial or extremity weakness or numbness.   Pt denies polydipsia, polyuria.  Denies worsening depressive symptoms, suicidal ideation, or panic, though has ongoing anxiety, not increased recently.    Review of Systems  Constitutional: Negative for diaphoresis and unexpected weight change.  HENT: Negative for drooling and tinnitus.   Eyes: Negative for photophobia and visual disturbance.  Respiratory: Negative for choking and stridor.   Gastrointestinal: Negative for vomiting and blood in stool.  Genitourinary: Negative for hematuria and decreased urine volume.  Musculoskeletal: Negative for gait problem.  Skin: Negative for color change and wound.  Neurological: Negative for tremors and numbness.  Psychiatric/Behavioral: Negative for decreased concentration. The patient is not hyperactive.    Physical Exam  Constitutional: Pt appears well-developed and well-nourished.  HENT: Head: Normocephalic.  Right Ear: External ear normal.  Left Ear: External ear normal.  Eyes: Conjunctivae and EOM are normal. Pupils are equal, round, and reactive to light.  Neck: Normal range of motion. Neck supple.  Cardiovascular: Normal rate and regular rhythm.   Pulmonary/Chest: Effort normal and breath sounds normal.  Neurological: Pt is alert. No cranial nerve  deficit.  Skin: Skin is warm. No erythema.  Psychiatric: Pt behavior is normal. Thought content normal.   Past Medical History  Diagnosis Date  . HEPATITIS B, CHRONIC 03/28/2010  . HYPERLIPIDEMIA 03/28/2010  . HYPERTENSION 03/28/2010   History reviewed. No pertinent past surgical history.  reports that she has never smoked. She does not have any smokeless tobacco history on file. She reports that she does not drink alcohol or use illicit drugs. family history includes Hypertension in her mother. Allergies  Allergen Reactions  . Aspirin   . Penicillins   . Streptomycin   . Tramadol Nausea Only

## 2010-08-30 NOTE — Assessment & Plan Note (Addendum)
Overall improved with prior tx, I suspect combination reflux and functional pain +/- constipation;  Exam otherwise benign, labs reviewed with pt as documented since last visit, follow with expectant management ,Continue all other medications as before ; u/s neg for acute

## 2010-08-30 NOTE — Patient Instructions (Addendum)
Stop the tramadol as you have done Continue all other medications as before , and re-start the simvastatin Start the miralax daily as this will help with the constipation Please have labs drawn 2-5 days prior to next visit

## 2011-03-11 LAB — CBC
HCT: 42.9
Hemoglobin: 14.1
MCHC: 32.9
MCV: 81.8
RBC: 5.24 — ABNORMAL HIGH

## 2011-03-11 LAB — COMPREHENSIVE METABOLIC PANEL
ALT: 26
CO2: 26
Calcium: 9.3
Creatinine, Ser: 0.88
GFR calc non Af Amer: >60
Glucose, Bld: 121 — ABNORMAL HIGH
Total Bilirubin: 0.7

## 2011-03-11 LAB — DIFFERENTIAL
Basophils Absolute: 0.1
Eosinophils Absolute: 0
Lymphocytes Relative: 12
Lymphs Abs: 1.1
Neutrophils Relative %: 86 — ABNORMAL HIGH

## 2011-03-11 LAB — URINALYSIS, ROUTINE W REFLEX MICROSCOPIC
Bilirubin Urine: NEGATIVE
Glucose, UA: NEGATIVE
Hgb urine dipstick: NEGATIVE
Specific Gravity, Urine: 1.021
pH: 7

## 2011-03-11 LAB — LIPASE, BLOOD: Lipase: 27

## 2011-04-01 ENCOUNTER — Encounter: Payer: Self-pay | Admitting: Internal Medicine

## 2011-04-01 ENCOUNTER — Ambulatory Visit (INDEPENDENT_AMBULATORY_CARE_PROVIDER_SITE_OTHER): Payer: BC Managed Care – PPO | Admitting: Internal Medicine

## 2011-04-01 VITALS — BP 110/80 | HR 63 | Temp 98.3°F | Ht 62.0 in | Wt 155.5 lb

## 2011-04-01 DIAGNOSIS — Z23 Encounter for immunization: Secondary | ICD-10-CM

## 2011-04-01 DIAGNOSIS — Z0000 Encounter for general adult medical examination without abnormal findings: Secondary | ICD-10-CM

## 2011-04-01 MED ORDER — TETANUS-DIPHTH-ACELL PERTUSSIS 5-2.5-18.5 LF-MCG/0.5 IM SUSP: 0.5000 mL | Freq: Once | INTRAMUSCULAR | Status: DC

## 2011-04-01 NOTE — Progress Notes (Signed)
Subjective:    Patient ID: Jacqueline Orozco, female    DOB: 1951-04-25, 60 y.o.   MRN: [id number]  HPI  Here for wellness and f/u;  Overall doing ok;  Pt denies CP, worsening SOB, DOE, wheezing, orthopnea, PND, worsening LE edema, palpitations, dizziness or syncope.  Pt denies neurological change such as new Headache, facial or extremity weakness.  Pt denies polydipsia, polyuria, or low sugar symptoms. Pt states overall good compliance with treatment and medications, good tolerability, and trying to follow lower cholesterol diet.  Pt denies worsening depressive symptoms, suicidal ideation or panic. No fever, wt loss, night sweats, loss of appetite, or other constitutional symptoms.  Pt states good ability with ADL's, low fall risk, home safety reviewed and adequate, no significant changes in hearing or vision, and occasionally active with exercise. Still with ongoing constipation, does take the miralax prn, has not tried colace. Past Medical History  Diagnosis Date  . HEPATITIS B, CHRONIC 03/28/2010  . HYPERLIPIDEMIA 03/28/2010  . HYPERTENSION 03/28/2010   No past surgical history on file.  reports that she has never smoked. She does not have any smokeless tobacco history on file. She reports that she does not drink alcohol or use illicit drugs. family history includes Hypertension in her mother. Allergies  Allergen Reactions  . Aspirin   . Penicillins   . Streptomycin   . Tramadol Nausea Only   Current Outpatient Prescriptions on File Prior to Visit  Medication Sig Dispense Refill  . metoprolol tartrate (LOPRESSOR) 25 MG tablet Take 25 mg by mouth. 1/2 by mouth two times a day       . aspirin 81 MG EC tablet Take 81 mg by mouth daily.        . cyclobenzaprine (FLEXERIL) 5 MG tablet Take 5 mg by mouth 3 (three) times daily as needed.        . dicyclomine (BENTYL) 10 MG capsule Take 10 mg by mouth. 1 by mouth every 8 hours as needed for abdominal pain/spasms       . pantoprazole (PROTONIX) 40 MG  tablet Take 40 mg by mouth daily.        . polyethylene glycol (MIRALAX) powder Take 17 g by mouth. 17 gm by mouth once daily for constipation        No current facility-administered medications on file prior to visit.   Review of Systems Review of Systems  Constitutional: Negative for diaphoresis, activity change, appetite change and unexpected weight change.  HENT: Negative for hearing loss, ear pain, facial swelling, mouth sores and neck stiffness.   Eyes: Negative for pain, redness and visual disturbance.  Respiratory: Negative for shortness of breath and wheezing.   Cardiovascular: Negative for chest pain and palpitations.  Gastrointestinal: Negative for diarrhea, blood in stool, abdominal distention and rectal pain.  Genitourinary: Negative for hematuria, flank pain and decreased urine volume.  Musculoskeletal: Negative for myalgias and joint swelling.  Skin: Negative for color change and wound.  Neurological: Negative for syncope and numbness.  Hematological: Negative for adenopathy.  Psychiatric/Behavioral: Negative for hallucinations, self-injury, decreased concentration and agitation.     Objective:   Physical Exam BP 110/80  Pulse 63  Temp(Src) 98.3 F (36.8 C) (Oral)  Ht 5\' 2"  (1.575 m)  Wt 155 lb 8 oz (70.534 kg)  BMI 28.44 kg/m2  SpO2 96% Physical Exam  VS noted Constitutional: Pt is oriented to person, place, and time. Appears well-developed and well-nourished.  HENT:  Head: Normocephalic and atraumatic.  Right Ear: External  ear normal.  Left Ear: External ear normal.  Nose: Nose normal.  Mouth/Throat: Oropharynx is clear and moist.  Eyes: Conjunctivae and EOM are normal. Pupils are equal, round, and reactive to light.  Neck: Normal range of motion. Neck supple. No JVD present. No tracheal deviation present.  Cardiovascular: Normal rate, regular rhythm, normal heart sounds and intact distal pulses.   Pulmonary/Chest: Effort normal and breath sounds normal.    Abdominal: Soft. Bowel sounds are normal. There is no tenderness.  Musculoskeletal: Normal range of motion. Exhibits no edema.  Lymphadenopathy:  Has no cervical adenopathy.  Neurological: Pt is alert and oriented to person, place, and time. Pt has normal reflexes. No cranial nerve deficit.  Skin: Skin is warm and dry. No rash noted.  Psychiatric:  Has  normal mood and affect. Behavior is normal.     Assessment & Plan:

## 2011-04-01 NOTE — Patient Instructions (Addendum)
You had the flu shot today, and given the work note, and the tetanus shot Please consider taking Colace 100 mg up to twice per day for hard stools Continue all other medications as before Please return in 1 year for your yearly visit, or sooner if needed, with Lab testing done 3-5 days before

## 2011-05-22 ENCOUNTER — Ambulatory Visit (INDEPENDENT_AMBULATORY_CARE_PROVIDER_SITE_OTHER)
Admission: RE | Admit: 2011-05-22 | Discharge: 2011-05-22 | Disposition: A | Discharge status: Home / Self Care | Payer: BC Managed Care – PPO | Source: Ambulatory Visit | Attending: Internal Medicine | Admitting: Internal Medicine

## 2011-05-22 ENCOUNTER — Encounter: Payer: Self-pay | Admitting: Internal Medicine

## 2011-05-22 ENCOUNTER — Ambulatory Visit (INDEPENDENT_AMBULATORY_CARE_PROVIDER_SITE_OTHER): Payer: BC Managed Care – PPO | Admitting: Internal Medicine

## 2011-05-22 ENCOUNTER — Other Ambulatory Visit (INDEPENDENT_AMBULATORY_CARE_PROVIDER_SITE_OTHER): Payer: BC Managed Care – PPO

## 2011-05-22 VITALS — BP 122/70 | HR 59 | Temp 98.3°F | Ht 62.0 in | Wt 154.1 lb

## 2011-05-22 DIAGNOSIS — R079 Chest pain, unspecified: Secondary | ICD-10-CM

## 2011-05-22 DIAGNOSIS — J209 Acute bronchitis, unspecified: Secondary | ICD-10-CM | POA: Insufficient documentation

## 2011-05-22 DIAGNOSIS — M5412 Radiculopathy, cervical region: Secondary | ICD-10-CM

## 2011-05-22 DIAGNOSIS — R0602 Shortness of breath: Secondary | ICD-10-CM

## 2011-05-22 DIAGNOSIS — Z0000 Encounter for general adult medical examination without abnormal findings: Secondary | ICD-10-CM

## 2011-05-22 DIAGNOSIS — R42 Dizziness and giddiness: Secondary | ICD-10-CM | POA: Insufficient documentation

## 2011-05-22 LAB — TSH: TSH: 0.98 u[IU]/mL (ref 0.35–5.50)

## 2011-05-22 LAB — LIPID PANEL
Cholesterol: 190 mg/dL (ref 0–200)
LDL Cholesterol: 101 mg/dL — ABNORMAL HIGH (ref 0–99)
VLDL: 16.2 mg/dL (ref 0.0–40.0)

## 2011-05-22 LAB — CBC WITH DIFFERENTIAL/PLATELET
Basophils Absolute: 0 10*3/uL (ref 0.0–0.1)
Eosinophils Absolute: 0.1 10*3/uL (ref 0.0–0.7)
HCT: 42.8 % (ref 36.0–46.0)
Lymphs Abs: 2.1 10*3/uL (ref 0.7–4.0)
MCHC: 33.6 g/dL (ref 30.0–36.0)
MCV: 82.6 fl (ref 78.0–100.0)
Monocytes Absolute: 0.3 10*3/uL (ref 0.1–1.0)
Neutrophils Relative %: 47.7 % (ref 43.0–77.0)
Platelets: 165 10*3/uL (ref 150.0–400.0)
RDW: 13.8 % (ref 11.5–14.6)
WBC: 4.8 10*3/uL (ref 4.5–10.5)

## 2011-05-22 LAB — URINALYSIS, ROUTINE W REFLEX MICROSCOPIC
Bilirubin Urine: NEGATIVE
Hgb urine dipstick: NEGATIVE
Ketones, ur: NEGATIVE
Leukocytes, UA: NEGATIVE
Nitrite: NEGATIVE
Urobilinogen, UA: 0.2 (ref 0.0–1.0)
pH: 6.5 (ref 5.0–8.0)

## 2011-05-22 LAB — HEPATIC FUNCTION PANEL
ALT: 30 U/L (ref 0–35)
AST: 32 U/L (ref 0–37)
Alkaline Phosphatase: 54 U/L (ref 39–117)
Total Bilirubin: 0.7 mg/dL (ref 0.3–1.2)

## 2011-05-22 LAB — BASIC METABOLIC PANEL
BUN: 23 mg/dL (ref 6–23)
Chloride: 103 mEq/L (ref 96–112)
GFR: 80.55 mL/min (ref >60.00)
Potassium: 4.5 mEq/L (ref 3.5–5.1)
Sodium: 140 mEq/L (ref 135–145)

## 2011-05-22 MED ORDER — NABUMETONE 500 MG PO TABS: 500.0000 mg | ORAL_TABLET | Freq: Two times a day (BID) | ORAL | Status: DC

## 2011-05-22 MED ORDER — HYDROCODONE-HOMATROPINE 5-1.5 MG/5ML PO SYRP: 5.0000 mL | ORAL_SOLUTION | Freq: Four times a day (QID) | ORAL | Status: AC | PRN

## 2011-05-22 MED ORDER — LEVOFLOXACIN 250 MG PO TABS: 250.0000 mg | ORAL_TABLET | Freq: Every day | ORAL | Status: AC

## 2011-05-22 MED ORDER — MECLIZINE HCL 12.5 MG PO TABS: 12.5000 mg | ORAL_TABLET | Freq: Three times a day (TID) | ORAL | Status: AC | PRN

## 2011-05-22 NOTE — Assessment & Plan Note (Addendum)
Atypical, most c/w pleurisy, but cant r/o pna - for cxr, ECG reviewed as per emr

## 2011-05-22 NOTE — Assessment & Plan Note (Signed)
Mild to mod, for antibx course,  to f/u any worsening symptoms or concerns 

## 2011-05-22 NOTE — Progress Notes (Signed)
  Subjective:    Patient ID: Jacqueline Orozco, female    DOB: [birth year], 60 y.o.   MRN: [id number]  HPI Here with acute onset mild to mod 2-3 days ST, HA, general weakness and malaise, with prod cough greenish sputum, but Pt denies chest pain, increased sob or doe, wheezing, orthopnea, PND, increased LE swelling, palpitations, dizziness or syncope except for left lateral pleuritic CP with deep breaths and cough.  Pt denies new neurological symptoms such as new headache, or facial or extremity weakness or numbness, but has had mild vertigo assoc with ear fullness with popping and crackling.  Also incidentally with 1-2 wks onset left neck pain with LUE distal mild weakness, numbness, pain and decrsed left grip strength. Right handed   Pt denies polydipsia, polyuria. Past Medical History  Diagnosis Date  . HEPATITIS B, CHRONIC 03/28/2010  . HYPERLIPIDEMIA 03/28/2010  . HYPERTENSION 03/28/2010   No past surgical history on file.  reports that she has never smoked. She does not have any smokeless tobacco history on file. She reports that she does not drink alcohol or use illicit drugs. family history includes Hypertension in her mother. Allergies  Allergen Reactions  . Aspirin   . Penicillins   . Streptomycin   . Tramadol Nausea Only   Current Outpatient Prescriptions on File Prior to Visit  Medication Sig Dispense Refill  . aspirin 81 MG EC tablet Take 81 mg by mouth daily.        . metoprolol tartrate (LOPRESSOR) 25 MG tablet Take 25 mg by mouth. 1/2 by mouth two times a day        Current Facility-Administered Medications on File Prior to Visit  Medication Dose Route Frequency Provider Last Rate Last Dose  . TDaP (BOOSTRIX) injection 0.5 mL  0.5 mL Intramuscular Once Oliver Barre, MD       Review of Systems Review of Systems  Constitutional: Negative for diaphoresis and unexpected weight change.  HENT: Negative for drooling and tinnitus.   Eyes: Negative for photophobia and visual disturbance.    Respiratory: Negative for choking and stridor.   Gastrointestinal: Negative for vomiting and blood in stool.  Genitourinary: Negative for hematuria and decreased urine volume.     Objective:   Physical Exam BP 122/70  Pulse 59  Temp(Src) 98.3 F (36.8 C) (Oral)  Ht 5\' 2"  (1.575 m)  Wt 154 lb 2 oz (69.911 kg)  BMI 28.19 kg/m2  SpO2 97% Physical Exam  VS noted Constitutional: Pt appears well-developed and well-nourished.  HENT: Head: Normocephalic.  Right Ear: External ear normal.  Left Ear: External ear normal.  Bilat tm's mild erythema.  Sinus nontender.  Pharynx mild erythema, bilat tm's erythema left > right Eyes: Conjunctivae and EOM are normal. Pupils are equal, round, and reactive to light.  Neck: Normal range of motion. Neck supple.  Cardiovascular: Normal rate and regular rhythm.   Pulmonary/Chest: Effort normal and breath sounds normal.  Abd:  Soft, NT, non-distended, + BS Neurological: Pt is alert. No cranial nerve deficit. LUE with distal 4+/5 strength, dtr intact, mild decreased sens to LT c4 area  Spine nontender Skin: Skin is warm. No erythema.  Psychiatric: Pt behavior is normal. Thought content normal.     Assessment & Plan:

## 2011-05-22 NOTE — Patient Instructions (Signed)
Your EKG was good today Please go to XRAY in the Basement for the x-ray test Take all new medications as prescribed Continue all other medications as before Please go to LAB in the Basement for the blood and/or urine tests to be done today Please call the phone number [phone] (the [employer] System) for results of testing in 2-3 days;  When calling, simply dial the number, and when prompted enter the MRN number above (the Medical Record Number) and the # key, then the message should start. You will be contacted regarding the referral for: MRI for the neck, and neurosurgury appt You are given the work note today

## 2011-05-26 ENCOUNTER — Encounter: Payer: Self-pay | Admitting: Internal Medicine

## 2011-05-26 NOTE — Assessment & Plan Note (Signed)
Mild to mod with neuro changes, for relafen, MRI and NS referral,  to f/u any worsening symptoms or concerns

## 2011-05-26 NOTE — Assessment & Plan Note (Signed)
Most likely related to middle ear inflammation - for antivert prn

## 2011-05-28 ENCOUNTER — Ambulatory Visit
Admission: RE | Admit: 2011-05-28 | Discharge: 2011-05-28 | Disposition: A | Discharge status: Home / Self Care | Payer: BC Managed Care – PPO | Source: Ambulatory Visit | Attending: Internal Medicine | Admitting: Internal Medicine

## 2011-05-28 DIAGNOSIS — M5412 Radiculopathy, cervical region: Secondary | ICD-10-CM

## 2011-06-18 ENCOUNTER — Other Ambulatory Visit: Payer: Self-pay | Admitting: Internal Medicine

## 2011-10-16 ENCOUNTER — Ambulatory Visit
Admission: RE | Admit: 2011-10-16 | Discharge: 2011-10-16 | Disposition: A | Discharge status: Home / Self Care | Payer: BC Managed Care – PPO | Source: Ambulatory Visit | Attending: Gastroenterology | Admitting: Gastroenterology

## 2011-10-16 ENCOUNTER — Other Ambulatory Visit: Payer: Self-pay | Admitting: Gastroenterology

## 2012-02-12 ENCOUNTER — Encounter: Payer: Self-pay | Admitting: Internal Medicine

## 2012-02-12 ENCOUNTER — Ambulatory Visit (INDEPENDENT_AMBULATORY_CARE_PROVIDER_SITE_OTHER): Payer: BC Managed Care – PPO | Admitting: Internal Medicine

## 2012-02-12 VITALS — BP 130/82 | HR 54 | Temp 97.4°F | Ht 62.5 in | Wt 160.2 lb

## 2012-02-12 DIAGNOSIS — M5412 Radiculopathy, cervical region: Secondary | ICD-10-CM | POA: Insufficient documentation

## 2012-02-12 DIAGNOSIS — I10 Essential (primary) hypertension: Secondary | ICD-10-CM

## 2012-02-12 MED ORDER — NABUMETONE 500 MG PO TABS: 500.0000 mg | ORAL_TABLET | Freq: Two times a day (BID) | ORAL | Status: DC | PRN

## 2012-02-12 MED ORDER — PREDNISONE 10 MG PO TABS: 10.0000 mg | ORAL_TABLET | Freq: Every day | ORAL | Status: DC

## 2012-02-12 NOTE — Patient Instructions (Addendum)
Take all new medications as prescribed - the anti-inflammatory (generic relafen) and the prednisone Continue all other medications as before Please call in 1 week if not improved, to consider orthopedic referral Please return in October 4 as planned, with blood work done 3-5 days ahead

## 2012-02-12 NOTE — Assessment & Plan Note (Signed)
stable overall by hx and exam, most recent data reviewed with pt, and pt to continue medical treatment as before BP Readings from Last 3 Encounters:  02/12/12 130/82  05/22/11 122/70  04/01/11 110/80

## 2012-02-12 NOTE — Assessment & Plan Note (Addendum)
Reviewed the recent notes per NS and MRI from dec 2012-jan 2013 with pt; exam today c/w similar pain now recurred with benign exam for motor loss;  Will tx as before with relafen/predpack as she did well with this in the past;  If not improved this time pt requests to see orthopedic

## 2012-02-12 NOTE — Progress Notes (Signed)
Subjective:    Patient ID: Jacqueline Orozco, female    DOB: 11/12/1950, 61 y.o.   MRN: [id number]  HPI  61yo, right handed, Here to f/u with c/o recurrance of pain to left neck similar in character to dec 2012, mod to severe for 3 days, with radiation to the shoudler and distal arm, assoc with some numbness and ? Mild weakness, pain worse to turn head left, and extend neck/look up.  Nothing makes better except not doing this.  MRI dec 2012 with c3-4 significant facet arthritis and potential c4 root foraminal stenossis, seen per NS - Dr Gerlene Fee, tx with relafen/predpack and improved.  Pt denies chest pain, increased sob or doe, wheezing, orthopnea, PND, increased LE swelling, palpitations, dizziness or syncope.  Pt denies new neurological symptoms such as new headache, or facial or extremity weakness or numbness except for the above.   Pt denies fever, wt loss, night sweats, loss of appetite, or other constitutional symptoms Past Medical History  Diagnosis Date  . HEPATITIS B, CHRONIC 03/28/2010  . HYPERLIPIDEMIA 03/28/2010  . HYPERTENSION 03/28/2010   No past surgical history on file.  reports that she has never smoked. She does not have any smokeless tobacco history on file. She reports that she does not drink alcohol or use illicit drugs. family history includes Hypertension in her mother. Allergies  Allergen Reactions  . Aspirin   . Penicillins   . Streptomycin   . Tramadol Nausea Only   Current Outpatient Prescriptions on File Prior to Visit  Medication Sig Dispense Refill  . metoprolol tartrate (LOPRESSOR) 25 MG tablet TAKE ONE-HALF TABLET BY MOUTH TWICE DAILY  90 tablet  3  . simvastatin (ZOCOR) 20 MG tablet TAKE ONE TABLET BY MOUTH EVERY DAY  90 tablet  3  . aspirin 81 MG EC tablet Take 81 mg by mouth daily.        . meclizine (ANTIVERT) 12.5 MG tablet Take 1 tablet (12.5 mg total) by mouth 3 (three) times daily as needed for dizziness or nausea.  30 tablet  1   Current  Facility-Administered Medications on File Prior to Visit  Medication Dose Route Frequency Provider Last Rate Last Dose  . TDaP (BOOSTRIX) injection 0.5 mL  0.5 mL Intramuscular Once Corwin Levins, MD       Review of Systems Constitutional: Negative for diaphoresis and unexpected weight change.  HENT: Negative for tinnitus.   Eyes: Negative for photophobia and visual disturbance.  Respiratory: Negative for choking and stridor.   Gastrointestinal: Negative for vomiting and blood in stool.  Genitourinary: Negative for hematuria and decreased urine volume.  Musculoskeletal: Negative for gait problem.  Skin: Negative for color change and wound.  Neurological: Negative for tremors and numbness.such as saddle anesthesia  Psychiatric/Behavioral: Negative for decreased concentration. The patient is not hyperactive.      Objective:   Physical Exam BP 130/82  Pulse 54  Temp 97.4 F (36.3 C) (Oral)  Ht 5' 2.5" (1.588 m)  Wt 160 lb 4 oz (72.689 kg)  BMI 28.84 kg/m2  SpO2 97% Physical Exam  VS noted Constitutional: Pt appears well-developed and well-nourished.  HENT: Head: Normocephalic.  Right Ear: External ear normal.  Left Ear: External ear normal.  Eyes: Conjunctivae and EOM are normal. Pupils are equal, round, and reactive to light.  Neck: Normal range of motion. Neck supple.  Cardiovascular: Normal rate and regular rhythm.   Pulmonary/Chest: Effort normal and breath sounds normal.  Neurological: Pt is alert. No cranial nerve deficit.  motor/gait/dtr intact, has some decr sens to LT c4/c5 areas LUE Skin: Skin is warm. No erythema.  Psychiatric: Pt behavior is normal. Thought content normal.     Assessment & Plan:

## 2012-03-12 ENCOUNTER — Other Ambulatory Visit (INDEPENDENT_AMBULATORY_CARE_PROVIDER_SITE_OTHER): Payer: BC Managed Care – PPO

## 2012-03-12 ENCOUNTER — Encounter: Payer: Self-pay | Admitting: Internal Medicine

## 2012-03-12 ENCOUNTER — Ambulatory Visit (INDEPENDENT_AMBULATORY_CARE_PROVIDER_SITE_OTHER): Payer: BC Managed Care – PPO | Admitting: Internal Medicine

## 2012-03-12 VITALS — BP 130/82 | HR 61 | Temp 97.5°F | Ht 62.5 in | Wt 161.4 lb

## 2012-03-12 DIAGNOSIS — Z0000 Encounter for general adult medical examination without abnormal findings: Secondary | ICD-10-CM

## 2012-03-12 DIAGNOSIS — Z23 Encounter for immunization: Secondary | ICD-10-CM

## 2012-03-12 LAB — CBC WITH DIFFERENTIAL/PLATELET
Basophils Absolute: 0 10*3/uL (ref 0.0–0.1)
Eosinophils Absolute: 0.2 10*3/uL (ref 0.0–0.7)
Lymphocytes Relative: 37.3 % (ref 12.0–46.0)
MCHC: 32.6 g/dL (ref 30.0–36.0)
Neutrophils Relative %: 51.8 % (ref 43.0–77.0)
Platelets: 163 10*3/uL (ref 150.0–400.0)
RDW: 14 % (ref 11.5–14.6)

## 2012-03-12 LAB — HEPATIC FUNCTION PANEL
Albumin: 4.2 g/dL (ref 3.5–5.2)
Alkaline Phosphatase: 56 U/L (ref 39–117)
Bilirubin, Direct: 0.1 mg/dL (ref 0.0–0.3)

## 2012-03-12 LAB — BASIC METABOLIC PANEL
BUN: 16 mg/dL (ref 6–23)
CO2: 29 mEq/L (ref 19–32)
Chloride: 105 mEq/L (ref 96–112)
Creatinine, Ser: 0.8 mg/dL (ref 0.4–1.2)

## 2012-03-12 LAB — LIPID PANEL
LDL Cholesterol: 110 mg/dL — ABNORMAL HIGH (ref 0–99)
Total CHOL/HDL Ratio: 3
Triglycerides: 80 mg/dL (ref 0.0–149.0)

## 2012-03-12 LAB — URINALYSIS, ROUTINE W REFLEX MICROSCOPIC
Leukocytes, UA: NEGATIVE
Nitrite: NEGATIVE
Specific Gravity, Urine: 1.02 (ref 1.000–1.030)
pH: 6.5 (ref 5.0–8.0)

## 2012-03-12 MED ORDER — ACYCLOVIR 200 MG PO CAPS: ORAL_CAPSULE | ORAL | Status: DC

## 2012-03-12 NOTE — Patient Instructions (Addendum)
You had the flu shot today Your EKG was ok, and no change from the previous in Dec 2012 Take all new medications as prescribed - the acyclovir for any cold sores in the future Continue all other medications as before Please go to LAB in the Basement for the blood and/or urine tests to be done today You will be contacted by phone if any changes need to be made immediately.  Otherwise, you will receive a letter about your results with an explanation. Please remember to sign up for My Chart at your earliest convenience, as this will be important to you in the future with finding out test results. Please return in 1 year for your yearly visit, or sooner if needed, with Lab testing done 3-5 days before

## 2012-03-12 NOTE — Assessment & Plan Note (Addendum)
Overall doing well, age appropriate education and counseling updated, referrals for preventative services and immunizations addressed, dietary and smoking counseling addressed, most recent labs and ECG reviewed.  I have personally reviewed and have noted: 1) the patient's medical and social history 2) The pt's use of alcohol, tobacco, and illicit drugs 3) The patient's current medications and supplements 4) Functional ability including ADL's, fall risk, home safety risk, hearing and visual impairment 5) Diet and physical activities 6) Evidence for depression or mood disorder 7) The patient's height, weight, and BMI have been recorded in the chart I have made referrals, and provided counseling and education based on review of the above ECG reviewed as per emr, o kfor acyclovir for recurrent cold sores

## 2012-03-12 NOTE — Progress Notes (Signed)
Subjective:    Patient ID: Jacqueline Orozco, female    DOB: 1951-02-04, 61 y.o.   MRN: [id number]  HPI  Here for wellness and f/u;  Overall doing ok;  Pt denies CP, worsening SOB, DOE, wheezing, orthopnea, PND, worsening LE edema, palpitations, dizziness or syncope.  Pt denies neurological change such as new Headache, facial or extremity weakness.  Pt denies polydipsia, polyuria, or low sugar symptoms. Pt states overall good compliance with treatment and medications, good tolerability, and trying to follow lower cholesterol diet.  Pt denies worsening depressive symptoms, suicidal ideation or panic. No fever, wt loss, night sweats, loss of appetite, or other constitutional symptoms.  Pt states good ability with ADL's, low fall risk, home safety reviewed and adequate, no significant changes in hearing or vision, and occasionally active with exercise. Has had recent URI symptoms now improved, also recent right post wrist bee sting with swelling now resovled after working in the yard 4 days ago.  Would like acyclovir for future cold sores since they seem to recur Past Medical History  Diagnosis Date  . HEPATITIS B, CHRONIC 03/28/2010  . HYPERLIPIDEMIA 03/28/2010  . HYPERTENSION 03/28/2010   No past surgical history on file.  reports that she has never smoked. She does not have any smokeless tobacco history on file. She reports that she does not drink alcohol or use illicit drugs. family history includes Hypertension in her mother. Allergies  Allergen Reactions  . Aspirin   . Penicillins   . Streptomycin   . Tramadol Nausea Only   Current Outpatient Prescriptions on File Prior to Visit  Medication Sig Dispense Refill  . metoprolol tartrate (LOPRESSOR) 25 MG tablet TAKE ONE-HALF TABLET BY MOUTH TWICE DAILY  90 tablet  3  . nabumetone (RELAFEN) 500 MG tablet Take 1 tablet (500 mg total) by mouth 2 (two) times daily as needed for pain.  60 tablet  11  . simvastatin (ZOCOR) 20 MG tablet TAKE ONE TABLET BY  MOUTH EVERY DAY  90 tablet  3  . aspirin 81 MG EC tablet Take 81 mg by mouth daily.        . meclizine (ANTIVERT) 12.5 MG tablet Take 1 tablet (12.5 mg total) by mouth 3 (three) times daily as needed for dizziness or nausea.  30 tablet  1   Current Facility-Administered Medications on File Prior to Visit  Medication Dose Route Frequency Provider Last Rate Last Dose  . DISCONTD: TDaP (BOOSTRIX) injection 0.5 mL  0.5 mL Intramuscular Once Corwin Levins, MD       Review of Systems Review of Systems  Constitutional: Negative for diaphoresis, activity change, appetite change and unexpected weight change.  HENT: Negative for hearing loss, ear pain, facial swelling, mouth sores and neck stiffness.   Eyes: Negative for pain, redness and visual disturbance.  Respiratory: Negative for shortness of breath and wheezing.   Cardiovascular: Negative for chest pain and palpitations.  Gastrointestinal: Negative for diarrhea, blood in stool, abdominal distention and rectal pain.  Genitourinary: Negative for hematuria, flank pain and decreased urine volume.  Musculoskeletal: Negative for myalgias and joint swelling.  Skin: Negative for color change and wound.  Neurological: Negative for syncope and numbness.  Hematological: Negative for adenopathy.  Psychiatric/Behavioral: Negative for hallucinations, self-injury, decreased concentration and agitation.      Objective:   Physical Exam BP 130/82  Pulse 61  Temp 97.5 F (36.4 C) (Oral)  Ht 5' 2.5" (1.588 m)  Wt 161 lb 6 oz (73.199 kg)  BMI  29.05 kg/m2  SpO2 96% Physical Exam  VS noted Constitutional: Pt is oriented to person, place, and time. Appears well-developed and well-nourished.  HENT:  Head: Normocephalic and atraumatic.  Right Ear: External ear normal.  Left Ear: External ear normal.  Nose: Nose normal.  Mouth/Throat: Oropharynx is clear and moist.  Bilat tm's mild erythema.  Sinus nontender.  Pharynx mild erythema Eyes: Conjunctivae and  EOM are normal. Pupils are equal, round, and reactive to light.  Neck: Normal range of motion. Neck supple. No JVD present. No tracheal deviation present.  Cardiovascular: Normal rate, regular rhythm, normal heart sounds and intact distal pulses.   Pulmonary/Chest: Effort normal and breath sounds normal.  Abdominal: Soft. Bowel sounds are normal. There is no tenderness.  Musculoskeletal: Normal range of motion. Exhibits no edema.  Lymphadenopathy:  Has no cervical adenopathy.  Neurological: Pt is alert and oriented to person, place, and time. Pt has normal reflexes. No cranial nerve deficit.  Skin: Skin is warm and dry. No rash noted. or swelling, only 1 red punctate sting area right post wrist Psychiatric:  Has  normal mood and affect. Behavior is normal.     Assessment & Plan:

## 2012-05-19 ENCOUNTER — Ambulatory Visit (INDEPENDENT_AMBULATORY_CARE_PROVIDER_SITE_OTHER): Payer: BC Managed Care – PPO | Admitting: Internal Medicine

## 2012-05-19 ENCOUNTER — Encounter: Payer: Self-pay | Admitting: Internal Medicine

## 2012-05-19 VITALS — BP 130/78 | HR 65 | Temp 97.1°F | Resp 10 | Wt 162.0 lb

## 2012-05-19 DIAGNOSIS — I10 Essential (primary) hypertension: Secondary | ICD-10-CM

## 2012-05-19 DIAGNOSIS — E785 Hyperlipidemia, unspecified: Secondary | ICD-10-CM

## 2012-05-19 DIAGNOSIS — M722 Plantar fascial fibromatosis: Secondary | ICD-10-CM

## 2012-05-19 MED ORDER — TRAMADOL HCL 50 MG PO TABS: 50.0000 mg | ORAL_TABLET | Freq: Three times a day (TID) | ORAL | Status: DC | PRN

## 2012-05-19 MED ORDER — PREDNISONE 10 MG PO TABS: ORAL_TABLET | ORAL | Status: DC

## 2012-05-19 NOTE — Assessment & Plan Note (Signed)
stable overall by hx and exam, most recent data reviewed with pt, and pt to continue medical treatment as before Lab Results  Component Value Date   LDLCALC 110* 03/12/2012

## 2012-05-19 NOTE — Assessment & Plan Note (Signed)
stable overall by hx and exam, most recent data reviewed with pt, and pt to continue medical treatment as before BP Readings from Last 3 Encounters:  05/19/12 130/78  03/12/12 130/82  02/12/12 130/82

## 2012-05-19 NOTE — Progress Notes (Signed)
  Subjective:    Patient ID: Jacqueline Orozco, female    DOB: February 17, 1951, 61 y.o.   MRN: [id number]  HPI  Here with c/o 2-3 mo gradual worsening right heel pain, now mod to severe despite heel cushions and shoe inserts, standing 8 hrs per day at work with 1 hr to sit for lunch.  Pt denies chest pain, increased sob or doe, wheezing, orthopnea, PND, increased LE swelling, palpitations, dizziness or syncope   Pt denies polydipsia, polyuria,  Pt denies new neurological symptoms such as new headache, or facial or extremity weakness or numbness Past Medical History  Diagnosis Date  . HEPATITIS B, CHRONIC 03/28/2010  . HYPERLIPIDEMIA 03/28/2010  . HYPERTENSION 03/28/2010   History reviewed. No pertinent past surgical history.  reports that she has never smoked. She does not have any smokeless tobacco history on file. She reports that she does not drink alcohol or use illicit drugs. family history includes Hypertension in her mother. Allergies  Allergen Reactions  . Aspirin   . Penicillins   . Streptomycin   . Tramadol Nausea Only   Current Outpatient Prescriptions on File Prior to Visit  Medication Sig Dispense Refill  . acyclovir (ZOVIRAX) 200 MG capsule 1 tab 5 times per day for 5 days as needed for recurrent cold sores  25 capsule  11  . aspirin 81 MG EC tablet Take 81 mg by mouth daily.        . meclizine (ANTIVERT) 12.5 MG tablet Take 1 tablet (12.5 mg total) by mouth 3 (three) times daily as needed for dizziness or nausea.  30 tablet  1  . metoprolol tartrate (LOPRESSOR) 25 MG tablet TAKE ONE-HALF TABLET BY MOUTH TWICE DAILY  90 tablet  3  . nabumetone (RELAFEN) 500 MG tablet Take 1 tablet (500 mg total) by mouth 2 (two) times daily as needed for pain.  60 tablet  11  . simvastatin (ZOCOR) 20 MG tablet TAKE ONE TABLET BY MOUTH EVERY DAY  90 tablet  3   Review of Systems  Constitutional: Negative for diaphoresis and unexpected weight change.  HENT: Negative for tinnitus.   Eyes: Negative for  photophobia and visual disturbance.  Respiratory: Negative for choking and stridor.   Gastrointestinal: Negative for vomiting and blood in stool.  Genitourinary: Negative for hematuria and decreased urine volume.  Musculoskeletal: Negative for gait problem.  Skin: Negative for color change and wound.  Neurological: Negative for tremors and numbness.  Psychiatric/Behavioral: Negative for decreased concentration. The patient is not hyperactive.       Objective:   Physical Exam BP 130/78  Pulse 65  Temp 97.1 F (36.2 C) (Oral)  Resp 10  Wt 162 lb (73.483 kg)  SpO2 97% Physical Exam  VS noted Constitutional: Pt appears well-developed and well-nourished.  HENT: Head: Normocephalic.  Right Ear: External ear normal.  Left Ear: External ear normal.  Eyes: Conjunctivae and EOM are normal. Pupils are equal, round, and reactive to light.  Neck: Normal range of motion. Neck supple.  Cardiovascular: Normal rate and regular rhythm.   Pulmonary/Chest: Effort normal and breath sounds normal.  Right heel with mod to severe plantar tender, mild nondiscrete soft tissue swelling Neurological: Pt is alert. Not confused  Skin: Skin is warm. No erythema.  Psychiatric: Pt behavior is normal. Thought content normal.     Assessment & Plan:

## 2012-05-19 NOTE — Patient Instructions (Addendum)
Take all new medications as prescribed  - the prednisone, and the pain medication (tramadol) Please continue the heel cushioning, and soft soled shoes You will be contacted regarding the referral for: podiatry Continue all other medications as before Please continue your efforts at being more active, low cholesterol diet, and weight control. Please remember to sign up for My Chart at your earliest convenience, as this will be important to you in the future with finding out test results.

## 2012-05-19 NOTE — Assessment & Plan Note (Signed)
Mod to severe, for predpack course, pain med, refer podiatry,  to f/u any worsening symptoms or concerns

## 2012-07-07 ENCOUNTER — Other Ambulatory Visit: Payer: Self-pay | Admitting: *Deleted

## 2012-07-07 ENCOUNTER — Other Ambulatory Visit: Payer: Self-pay | Admitting: Internal Medicine

## 2012-07-07 MED ORDER — SIMVASTATIN 20 MG PO TABS: ORAL_TABLET | ORAL | Status: DC

## 2012-07-07 MED ORDER — METOPROLOL TARTRATE 25 MG PO TABS: ORAL_TABLET | ORAL | Status: DC

## 2012-07-07 NOTE — Telephone Encounter (Addendum)
[facility] called, pt is switching to them from [facility] and needs refill for Simvastatin and Metoprolol sent to pharmacy. Both rx's sent.

## 2012-10-06 ENCOUNTER — Encounter: Payer: Self-pay | Admitting: Internal Medicine

## 2012-10-06 ENCOUNTER — Ambulatory Visit (INDEPENDENT_AMBULATORY_CARE_PROVIDER_SITE_OTHER): Payer: BC Managed Care – PPO | Admitting: Internal Medicine

## 2012-10-06 ENCOUNTER — Other Ambulatory Visit (INDEPENDENT_AMBULATORY_CARE_PROVIDER_SITE_OTHER): Payer: BC Managed Care – PPO

## 2012-10-06 VITALS — BP 122/82 | HR 69 | Temp 98.2°F | Ht 62.5 in | Wt 161.5 lb

## 2012-10-06 DIAGNOSIS — R079 Chest pain, unspecified: Secondary | ICD-10-CM

## 2012-10-06 DIAGNOSIS — R1013 Epigastric pain: Secondary | ICD-10-CM

## 2012-10-06 LAB — CBC
HCT: 39.8 % (ref 36.0–46.0)
Hemoglobin: 13.5 g/dL (ref 12.0–15.0)
MCHC: 33.9 g/dL (ref 30.0–36.0)
MCV: 80.1 fl (ref 78.0–100.0)
Platelets: 161 10*3/uL (ref 150.0–400.0)

## 2012-10-06 LAB — AMYLASE: Amylase: 90 U/L (ref 27–131)

## 2012-10-06 LAB — COMPREHENSIVE METABOLIC PANEL
ALT: 27 U/L (ref 0–35)
AST: 25 U/L (ref 0–37)
Alkaline Phosphatase: 55 U/L (ref 39–117)
Calcium: 9.2 mg/dL (ref 8.4–10.5)
Chloride: 104 mEq/L (ref 96–112)
Creatinine, Ser: 1 mg/dL (ref 0.4–1.2)

## 2012-10-06 MED ORDER — DICYCLOMINE HCL 20 MG PO TABS: 20.0000 mg | ORAL_TABLET | Freq: Four times a day (QID) | ORAL | Status: DC

## 2012-10-06 NOTE — Patient Instructions (Signed)
Abdominal Pain  Abdominal pain can be caused by many things. Your caregiver decides the seriousness of your pain by an examination and possibly blood tests and X-rays. Many cases can be observed and treated at home. Most abdominal pain is not caused by a disease and will probably improve without treatment. However, in many cases, more time must pass before a clear cause of the pain can be found. Before that point, it may not be known if you need more testing, or if hospitalization or surgery is needed.  HOME CARE INSTRUCTIONS   · Do not take laxatives unless directed by your caregiver.  · Take pain medicine only as directed by your caregiver.  · Only take over-the-counter or prescription medicines for pain, discomfort, or fever as directed by your caregiver.  · Try a clear liquid diet (broth, tea, or water) for as long as directed by your caregiver. Slowly move to a bland diet as tolerated.  SEEK IMMEDIATE MEDICAL CARE IF:   · The pain does not go away.  · You have a fever.  · You keep throwing up (vomiting).  · The pain is felt only in portions of the abdomen. Pain in the right side could possibly be appendicitis. In an adult, pain in the left lower portion of the abdomen could be colitis or diverticulitis.  · You pass bloody or black tarry stools.  MAKE SURE YOU:   · Understand these instructions.  · Will watch your condition.  · Will get help right away if you are not doing well or get worse.  Document Released: 03/06/2005 Document Revised: 08/19/2011 Document Reviewed: 01/13/2008  [employer]® Patient Information ©2013 [employer].

## 2012-10-06 NOTE — Progress Notes (Signed)
Subjective:    Patient ID: Jacqueline Orozco, female    DOB: [birth year], 62 y.o.   MRN: [id number]  HPI  Pt presents to the clinic today with c/o pain in her stomach. This started last wednesday. The pain is all over her abdomen. It seems to be worse after a meal. She does not feel like this is reflux. She has been taking Prilosec and Tums without relief. She has some associated nausea but no vomiting. She has had bouts of diarrhea with bouts of constipation. She has some blood in her stool but she attributes this to internal hemorrhoids. She has had a colonoscopy in 2011. They did find 2 polyps and internal hemorrhoid, and no diverticulosis. She denies fever, chills or body aches.   Review of Systems      Past Medical History  Diagnosis Date  . HEPATITIS B, CHRONIC 03/28/2010  . HYPERLIPIDEMIA 03/28/2010  . HYPERTENSION 03/28/2010    Current Outpatient Prescriptions  Medication Sig Dispense Refill  . acyclovir (ZOVIRAX) 200 MG capsule 1 tab 5 times per day for 5 days as needed for recurrent cold sores  25 capsule  11  . aspirin 81 MG EC tablet Take 81 mg by mouth daily.        . metoprolol tartrate (LOPRESSOR) 25 MG tablet TAKE ONE-HALF TABLET BY MOUTH TWICE DAILY  90 tablet  1  . nabumetone (RELAFEN) 500 MG tablet TAKE 1 TABLET BY MOUTH TWICE A DAY  60 tablet  8  . predniSONE (DELTASONE) 10 MG tablet 3 tabs by mouth per day for 3 days,2tabs per day for 3 days,1tab per day for 3 days  18 tablet  0  . simvastatin (ZOCOR) 20 MG tablet TAKE ONE TABLET BY MOUTH EVERY DAY  90 tablet  1  . traMADol (ULTRAM) 50 MG tablet Take 1 tablet (50 mg total) by mouth every 8 (eight) hours as needed for pain.  120 tablet  2   No current facility-administered medications for this visit.    Allergies  Allergen Reactions  . Aspirin   . Penicillins   . Streptomycin   . Tramadol Nausea Only    Family History  Problem Relation Age of Onset  . Hypertension Mother     History   Social History  .  Marital Status: Married    Spouse Name: N/A    Number of Children: 6  . Years of Education: N/A   Occupational History  . [employer]    Social History Main Topics  . Smoking status: Never Smoker   . Smokeless tobacco: Not on file  . Alcohol Use: No  . Drug Use: No  . Sexually Active: Not on file   Other Topics Concern  . Not on file   Social History Narrative  . No narrative on file     Constitutional: Denies fever, malaise, fatigue, headache or abrupt weight changes.  Respiratory: Denies difficulty breathing, shortness of breath, cough or sputum production.   Cardiovascular: Pt reports chest pain on the left side. Denies chest tightness, palpitations or swelling in the hands or feet.  Gastrointestinal: Pt reports abdominal pain and diarrhea. Denies bloating, constipation.  GU: Denies urgency, frequency, pain with urination, burning sensation, blood in urine, odor or discharge.  Neurological: Denies dizziness, difficulty with memory, difficulty with speech or problems with balance and coordination.   No other specific complaints in a complete review of systems (except as listed in HPI above).  Objective:   Physical Exam  BP 122/82  Pulse 69  Temp(Src) 98.2 F (36.8 C) (Oral)  Ht 5' 2.5" (1.588 m)  Wt 161 lb 8 oz (73.256 kg)  BMI 29.05 kg/m2  SpO2 96% Wt Readings from Last 3 Encounters:  10/06/12 161 lb 8 oz (73.256 kg)  05/19/12 162 lb (73.483 kg)  03/12/12 161 lb 6 oz (73.199 kg)    General: Appears her stated age, well developed, well nourished in NAD. Cardiovascular: Normal rate and rhythm. S1,S2 noted.  No murmur, rubs or gallops noted. No JVD or BLE edema. No carotid bruits noted. Pulmonary/Chest: Normal effort and positive vesicular breath sounds. No respiratory distress. No wheezes, rales or ronchi noted.  Abdomen: Soft and tender in the epigastric area. Normal bowel sounds, no bruits noted. No distention or masses noted. Liver, spleen and kidneys non  palpable. Neurological: Alert and oriented. Cranial nerves II-XII intact. Coordination normal. +DTRs bilaterally.     Assessment & Plan:   Epigastric abdominal pain, ? functional, new onset:  Will check CBC, BMET, LFT's, lipase and amalyse Given chest pain, will obtain EKG to r/o cardiac ischemia- normal and unchanged Will give Rx for Bentayl daily May need to follow up with GI

## 2012-10-07 ENCOUNTER — Other Ambulatory Visit: Payer: Self-pay | Admitting: Internal Medicine

## 2012-10-07 DIAGNOSIS — R109 Unspecified abdominal pain: Secondary | ICD-10-CM

## 2012-10-12 ENCOUNTER — Encounter: Payer: Self-pay | Admitting: Internal Medicine

## 2012-10-13 ENCOUNTER — Encounter: Payer: Self-pay | Admitting: Internal Medicine

## 2012-10-16 ENCOUNTER — Encounter: Payer: Self-pay | Admitting: Internal Medicine

## 2012-10-16 ENCOUNTER — Ambulatory Visit (INDEPENDENT_AMBULATORY_CARE_PROVIDER_SITE_OTHER): Payer: BC Managed Care – PPO | Admitting: Internal Medicine

## 2012-10-16 VITALS — BP 154/80 | HR 76 | Ht 61.25 in | Wt 157.4 lb

## 2012-10-16 DIAGNOSIS — Z860101 Personal history of adenomatous and serrated colon polyps: Secondary | ICD-10-CM | POA: Insufficient documentation

## 2012-10-16 DIAGNOSIS — R1013 Epigastric pain: Secondary | ICD-10-CM

## 2012-10-16 DIAGNOSIS — K921 Melena: Secondary | ICD-10-CM

## 2012-10-16 DIAGNOSIS — K219 Gastro-esophageal reflux disease without esophagitis: Secondary | ICD-10-CM

## 2012-10-16 DIAGNOSIS — Z86010 Personal history of colonic polyps: Secondary | ICD-10-CM

## 2012-10-16 DIAGNOSIS — K5900 Constipation, unspecified: Secondary | ICD-10-CM

## 2012-10-16 MED ORDER — PANTOPRAZOLE SODIUM 40 MG PO TBEC: 40.0000 mg | DELAYED_RELEASE_TABLET | Freq: Every day | ORAL | Status: DC

## 2012-10-16 MED ORDER — POLYETHYLENE GLYCOL 3350 17 GM/SCOOP PO POWD: 17.0000 g | Freq: Every day | ORAL | Status: DC

## 2012-10-16 NOTE — Patient Instructions (Addendum)
You have been scheduled for an endoscopy with propofol. Please follow written instructions given to you at your visit today. If you use inhalers (even only as needed), please bring them with you on the day of your procedure. Your physician has requested that you go to www.startemmi.com and enter the access code given to you at your visit today. This web site gives a general overview about your procedure. However, you should still follow specific instructions given to you by our office regarding your preparation for the procedure.  We have sent the following medications to your pharmacy for you to pick up at your convenience: pantoprazole, take 40 mg daily  Miralax 17g daily                                               We are excited to introduce MyChart, a new best-in-class service that provides you online access to important information in your electronic medical record. We want to make it easier for you to view your health information - all in one secure location - when and where you need it. We expect MyChart will enhance the quality of care and service we provide.  When you register for MyChart, you can:    View your test results.    Request appointments and receive appointment reminders via email.    Request medication renewals.    View your medical history, allergies, medications and immunizations.    Communicate with your physician's office through a password-protected site.    Conveniently print information such as your medication lists.  To find out if MyChart is right for you, please talk to a member of our clinical staff today. We will gladly answer your questions about this free health and wellness tool.  If you are age 72 or older and want a member of your family to have access to your record, you must provide written consent by completing a proxy form available at our office. Please speak to our clinical staff about guidelines regarding accounts for patients younger than age  53.  As you activate your MyChart account and need any technical assistance, please call the MyChart technical support line at [phone]) or email your question to [email or web address]. If you email your question(s), please include your name, a return phone number and the best time to reach you.  If you have non-urgent health-related questions, you can send a message to our office through MyChart at [email or web address]. If you have a medical emergency, call 911.  Thank you for using MyChart as your new health and wellness resource!   MyChart licensed from [employer],  [id number]. Patents Pending.

## 2012-10-16 NOTE — Progress Notes (Signed)
Patient ID: Jacqueline Orozco, female   DOB: Sep 25, 1950, 62 y.o.   MRN: [id number] HPI: Jacqueline Orozco is a 62 yo female with PMH of hypertension, hyperlipidemia constipation who is seen in consultation at the request of Dr. Jonny Ruiz for evaluation of epigastric abdominal pain. The patient reports a long history of heartburn, which continues to be an issue. She has used [employer] (medical) for this on a frequent basis. She also reports epigastric abdominal pain which has been worse over last 2-3 weeks. This is associated with nausea, but rare vomiting. She does note early satiety as well as bloating. She did take 14 days over-the-counter omeprazole and reports improvement in her abdominal pain, but she is no longer taking PPI. She's noted intermittent black stools but no blood in her stool or rectal bleeding. She does really have trouble with constipation including hard stools and the need to strain. She's used stool softeners which is helped but not much.  Her problem list mentions chronic active hepatitis B, but this was not addressed today. On last check her AST and ALT were within normal limits, as was her bili and alkaline phosphatase (april 2014)  Patient Active Problem List   Diagnosis Date Noted  . Plantar fasciitis, right 05/19/2012  . Cervical radiculitis 02/12/2012  . Vertigo 05/22/2011  . Preventative health care 08/30/2010  . ABDOMINAL PAIN OTHER SPECIFIED SITE 08/16/2010  . HEPATITIS B, CHRONIC 03/28/2010  . HYPERLIPIDEMIA 03/28/2010  . HYPERTENSION 03/28/2010  . CONSTIPATION 03/28/2010    History reviewed. No pertinent past surgical history.  Current Outpatient Prescriptions  Medication Sig Dispense Refill  . acyclovir (ZOVIRAX) 200 MG capsule 1 tab 5 times per day for 5 days as needed for recurrent cold sores  25 capsule  11  . aspirin 81 MG EC tablet Take 81 mg by mouth daily.        . calcium carbonate (TUMS - DOSED IN MG ELEMENTAL CALCIUM) 500 MG chewable tablet Chew 1 tablet by mouth as needed for  heartburn.      . dicyclomine (BENTYL) 20 MG tablet Take 20 mg by mouth as needed.      . docusate sodium (COLACE) 100 MG capsule Take 100 mg by mouth as needed for constipation.      . metoprolol tartrate (LOPRESSOR) 25 MG tablet TAKE ONE-HALF TABLET BY MOUTH TWICE DAILY  90 tablet  1  . simvastatin (ZOCOR) 20 MG tablet TAKE ONE TABLET BY MOUTH EVERY DAY  90 tablet  1  . traMADol (ULTRAM) 50 MG tablet Take 1 tablet (50 mg total) by mouth every 8 (eight) hours as needed for pain.  120 tablet  2  . pantoprazole (PROTONIX) 40 MG tablet Take 1 tablet (40 mg total) by mouth daily.  90 tablet  3  . polyethylene glycol powder (GLYCOLAX/MIRALAX) powder Take 17 g by mouth daily.  255 g  3   No current facility-administered medications for this visit.    Allergies  Allergen Reactions  . Aspirin   . Penicillins   . Streptomycin   . Tramadol Nausea Only    Family History  Problem Relation Age of Onset  . Hypertension Mother   . Liver disease Maternal Uncle   . Lung cancer Maternal Grandmother     History  Substance Use Topics  . Smoking status: Never Smoker   . Smokeless tobacco: Never Used  . Alcohol Use: No    ROS: As per history of present illness, otherwise negative  BP 154/80  Pulse 76  Ht 5' 1.25" (1.556 m)  Wt 157 lb 6 oz (71.385 kg)  BMI 29.48 kg/m2 Constitutional: Well-developed and well-nourished. No distress. HEENT: Normocephalic and atraumatic. Oropharynx is clear and moist. No oropharyngeal exudate. Conjunctivae are normal.  No scleral icterus. Neck: Neck supple. Trachea midline. Cardiovascular: Normal rate, regular rhythm and intact distal pulses. No M/R/G Pulmonary/chest: Effort normal and breath sounds normal. No wheezing, rales or rhonchi. Abdominal: Soft, mild epigastric tenderness without rebound or guarding, nondistended. Bowel sounds active throughout. There are no masses palpable.  Extremities: no clubbing, cyanosis, or edema Lymphadenopathy: No cervical  adenopathy noted. Neurological: Alert and oriented to person place and time. Skin: Skin is warm and dry. No rashes noted. Psychiatric: Normal mood and affect. Behavior is normal.  RELEVANT LABS AND IMAGING: CBC    Component Value Date/Time   WBC 6.1 10/06/2012 1344   RBC 4.96 10/06/2012 1344   HGB 13.5 10/06/2012 1344   HCT 39.8 10/06/2012 1344   PLT 161.0 10/06/2012 1344   MCV 80.1 10/06/2012 1344   MCH 27.0 03/19/2010 1820   MCHC 33.9 10/06/2012 1344   RDW 13.3 10/06/2012 1344   LYMPHSABS 1.9 03/12/2012 1025   MONOABS 0.3 03/12/2012 1025   EOSABS 0.2 03/12/2012 1025   BASOSABS 0.0 03/12/2012 1025    CMP     Component Value Date/Time   NA 140 10/06/2012 1344   K 4.2 10/06/2012 1344   CL 104 10/06/2012 1344   CO2 29 10/06/2012 1344   GLUCOSE 88 10/06/2012 1344   BUN 20 10/06/2012 1344   CREATININE 1.0 10/06/2012 1344   CALCIUM 9.2 10/06/2012 1344   PROT 7.1 10/06/2012 1344   ALBUMIN 4.0 10/06/2012 1344   AST 25 10/06/2012 1344   ALT 27 10/06/2012 1344   ALKPHOS 55 10/06/2012 1344   BILITOT 0.6 10/06/2012 1344   GFRNONAA 66.85 03/27/2010 0953   GFRAA  Value: >60        The eGFR has been calculated using the MDRD equation. This calculation has not been validated in all clinical situations. eGFR's persistently <60 mL/min signify possible Chronic Kidney Disease. 03/19/2010 1820   EGD, Dr. Bosie Clos 07/04/2009 -- normal esophagus, regular Z line at 36 cm, hiatus hernia, otherwise normal. Colonoscopy, Dr. Bosie Clos 07/04/2009 -- 3 one to 2 mm rectal polyps, resected and retrieved. Nonbleeding small internal hemorrhoids -- pathology results not available, but have been requested from Dr. Marge Duncans office today  Pathology from colonoscopy 2002 = tubular adenoma  ASSESSMENT/PLAN:  62 yo female with PMH of hypertension, hyperlipidemia constipation who is seen in consultation at the request of Dr. Jonny Ruiz for evaluation of epigastric abdominal pain.  1. GERD/epigastric pain/early satiety/nausea --  recommended pantoprazole 40 mg daily. Also feels she needs an upper endoscopy given her symptoms. We discussed the test today including risks and benefits and she is agreeable to proceed. Further recommendations after endoscopy  2.  Constipation -- she can continue over-the-counter stool softeners, and have also recommended MiraLax 17 g daily. If her stools are too loose she can take this every other day, but I think she will likely need it on a scheduled basis.  3.  History of adenomatous colon polyps -- 2002, last colonoscopy 2011 with very small rectal polyps -- we will await culture records from Dr. Bosie Clos before determining when surveillance colonoscopy would be indicated. It would likely be no sooner than 2016  4.  Questionable hepatitis B -- this was not addressed today, but included in her past medical history in the  electronic medical.  Her AST and ALT are normal arguing against significant hepatitis.  At followup we can check hepatitis B serologies for completeness

## 2012-10-30 ENCOUNTER — Encounter: Payer: Self-pay | Admitting: Internal Medicine

## 2012-10-30 ENCOUNTER — Ambulatory Visit (AMBULATORY_SURGERY_CENTER): Payer: BC Managed Care – PPO | Admitting: Internal Medicine

## 2012-10-30 VITALS — BP 138/76 | HR 59 | Temp 98.3°F | Resp 14 | Ht 61.25 in | Wt 157.0 lb

## 2012-10-30 DIAGNOSIS — D131 Benign neoplasm of stomach: Secondary | ICD-10-CM

## 2012-10-30 DIAGNOSIS — K31819 Angiodysplasia of stomach and duodenum without bleeding: Secondary | ICD-10-CM

## 2012-10-30 DIAGNOSIS — K921 Melena: Secondary | ICD-10-CM

## 2012-10-30 DIAGNOSIS — K219 Gastro-esophageal reflux disease without esophagitis: Secondary | ICD-10-CM

## 2012-10-30 DIAGNOSIS — R1013 Epigastric pain: Secondary | ICD-10-CM

## 2012-10-30 MED ORDER — SODIUM CHLORIDE 0.9 % IV SOLN: 500.0000 mL | INTRAVENOUS | Status: DC

## 2012-10-30 NOTE — Progress Notes (Signed)
Patient did not experience any of the following events: a burn prior to discharge; a fall within the facility; wrong site/side/patient/procedure/implant event; or a hospital transfer or hospital admission upon discharge from the facility. (G8907) Patient did not have preoperative order for IV antibiotic SSI prophylaxis. (G8918)  

## 2012-10-30 NOTE — Op Note (Signed)
[facility] [address] [place], [address]   ENDOSCOPY PROCEDURE REPORT  PATIENT: Jacqueline Orozco, Jacqueline Orozco  MR#: [id number] BIRTHDATE: Dec 10, 1950 , 61  yrs. old GENDER: Female ENDOSCOPIST: Beverley Fiedler, MD REFERRED BY:  Oliver Barre, M.D. PROCEDURE DATE:  10/30/2012 PROCEDURE:  EGD w/ control of bleeding and EGD w/ biopsy ASA CLASS:     Class III INDICATIONS:  Epigastric pain.   Nausea.   history of esophageal reflux. MEDICATIONS: MAC sedation, administered by CRNA and propofol (Diprivan) 150mg  IV TOPICAL ANESTHETIC: Cetacaine Spray  DESCRIPTION OF PROCEDURE: After the risks benefits and alternatives of the procedure were thoroughly explained, informed consent was obtained.  The LB [id number] endoscope was introduced through the mouth and advanced to the second portion of the duodenum. Without limitations.  The instrument was slowly withdrawn as the mucosa was fully examined.    ESOPHAGUS: The mucosa of the esophagus appeared normal.   A normal Z-line was observed 39 cm from the incisors.  STOMACH: A medium sized angioectasia with adherent clot was found in the gastric fundus.  1 hemostatic clip was placed over this lesion with success.   The stomach otherwise appeared normal.  Distal stomach biopsies taken to exclude H. pylori.  DUODENUM: The duodenal mucosa showed no abnormalities in the bulb and second portion of the duodenum.  Retroflexed views revealed no abnormalities.     The scope was then withdrawn from the patient and the procedure completed.  COMPLICATIONS: There were no complications. ENDOSCOPIC IMPRESSION: 1.   The mucosa of the esophagus appeared normal; regular Z line at 39 cm from the incisors 3.   Angioectasia in the gastric fundus; hemostatic clip placed 4.   The stomach otherwise appeared normal; H. pylori biopsies obtained 5.   The duodenal mucosa showed no abnormalities in the bulb and second portion of the  duodenum  RECOMMENDATIONS: 1.  Await pathology results 2.  Continue taking your PPI (pantoprazole) once daily.  It is best to be taken 20-30 minutes prior to breakfast meal. 3.  Follow-up of helicobacter pylori status, treat if indicated 4.  Office follow-up in about 4-6 weeks  eSigned:  Beverley Fiedler, MD 10/30/2012 11:13 AM CC:The Patient

## 2012-10-30 NOTE — Progress Notes (Signed)
Called to room to assist during endoscopic procedure.  Patient ID and intended procedure confirmed with present staff. Received instructions for my participation in the procedure from the performing physician.  

## 2012-10-30 NOTE — Patient Instructions (Addendum)
YOU HAD AN ENDOSCOPIC PROCEDURE TODAY AT THE [facility]: Refer to the procedure report that was given to you for any specific questions about what was found during the examination.  If the procedure report does not answer your questions, please call your gastroenterologist to clarify.  If you requested that your care partner not be given the details of your procedure findings, then the procedure report has been included in a sealed envelope for you to review at your convenience later.  YOU SHOULD EXPECT: Some feelings of bloating in the abdomen. Passage of more gas than usual.  Walking can help get rid of the air that was put into your GI tract during the procedure and reduce the bloating. If you had a lower endoscopy (such as a colonoscopy or flexible sigmoidoscopy) you may notice spotting of blood in your stool or on the toilet paper. If you underwent a bowel prep for your procedure, then you may not have a normal bowel movement for a few days.  DIET: Your first meal following the procedure should be a light meal and then it is ok to progress to your normal diet.  A half-sandwich or bowl of soup is an example of a good first meal.  Heavy or fried foods are harder to digest and may make you feel nauseous or bloated.  Likewise meals heavy in dairy and vegetables can cause extra gas to form and this can also increase the bloating.  Drink plenty of fluids but you should avoid alcoholic beverages for 24 hours.  ACTIVITY: Your care partner should take you home directly after the procedure.  You should plan to take it easy, moving slowly for the rest of the day.  You can resume normal activity the day after the procedure however you should NOT DRIVE or use heavy machinery for 24 hours (because of the sedation medicines used during the test).    SYMPTOMS TO REPORT IMMEDIATELY: A gastroenterologist can be reached at any hour.  During normal business hours, 8:30 AM to 5:00 PM Monday through Friday,  call [phone].  After hours and on weekends, please call the GI answering service at [phone] who will take a message and have the physician on call contact you.    Following upper endoscopy (EGD)  Vomiting of blood or coffee ground material  New chest pain or pain under the shoulder blades  Painful or persistently difficult swallowing  New shortness of breath  Fever of 100F or higher  Black, tarry-looking stools  FOLLOW UP: If any biopsies were taken you will be contacted by phone or by letter within the next 1-3 weeks.  Call your gastroenterologist if you have not heard about the biopsies in 3 weeks.  Our staff will call the home number listed on your records the next business day following your procedure to check on you and address any questions or concerns that you may have at that time regarding the information given to you following your procedure. This is a courtesy call and so if there is no answer at the home number and we have not heard from you through the emergency physician on call, we will assume that you have returned to your regular daily activities without incident.  SIGNATURES/CONFIDENTIALITY: You and/or your care partner have signed paperwork which will be entered into your electronic medical record.  These signatures attest to the fact that that the information above on your After Visit Summary has been reviewed and is understood.  Full   responsibility of the confidentiality of this discharge information lies with you and/or your care-partner.   Continue your pantoprozole once daily.  Best taken 20-30 minutes prior to breakfast.    Follow up in 4-6 weeks.  Dr. Lauro Franklin office will contact you with and appointment.  Await biopsy reports.

## 2012-11-03 ENCOUNTER — Telehealth: Payer: Self-pay | Admitting: *Deleted

## 2012-11-03 NOTE — Telephone Encounter (Signed)
  Follow up Call-  Call back number 10/30/2012  Post procedure Call Back phone  # [phone]  Permission to leave phone message Yes     Patient questions:  Do you have a fever, pain , or abdominal swelling? no Pain Score  0 *  Have you tolerated food without any problems? yes  Have you been able to return to your normal activities? yes  Do you have any questions about your discharge instructions: Diet   no Medications  no Follow up visit  no  Do you have questions or concerns about your Care? no  Actions: * If pain score is 4 or above: No action needed, pain <4.  Spoke with spouse at home, pt was at work

## 2012-11-05 ENCOUNTER — Other Ambulatory Visit: Payer: Self-pay | Admitting: *Deleted

## 2012-11-05 ENCOUNTER — Encounter: Payer: Self-pay | Admitting: Internal Medicine

## 2012-11-30 ENCOUNTER — Encounter: Payer: Self-pay | Admitting: Internal Medicine

## 2012-12-02 ENCOUNTER — Ambulatory Visit: Payer: BC Managed Care – PPO | Admitting: Internal Medicine

## 2012-12-10 ENCOUNTER — Encounter: Payer: Self-pay | Admitting: Internal Medicine

## 2012-12-29 ENCOUNTER — Other Ambulatory Visit (INDEPENDENT_AMBULATORY_CARE_PROVIDER_SITE_OTHER): Payer: BC Managed Care – PPO

## 2012-12-29 ENCOUNTER — Ambulatory Visit (INDEPENDENT_AMBULATORY_CARE_PROVIDER_SITE_OTHER): Payer: BC Managed Care – PPO | Admitting: Internal Medicine

## 2012-12-29 ENCOUNTER — Encounter: Payer: Self-pay | Admitting: Internal Medicine

## 2012-12-29 VITALS — BP 110/70 | HR 64 | Ht 62.5 in | Wt 159.4 lb

## 2012-12-29 DIAGNOSIS — Q2733 Arteriovenous malformation of digestive system vessel: Secondary | ICD-10-CM

## 2012-12-29 DIAGNOSIS — R109 Unspecified abdominal pain: Secondary | ICD-10-CM

## 2012-12-29 DIAGNOSIS — K5900 Constipation, unspecified: Secondary | ICD-10-CM

## 2012-12-29 DIAGNOSIS — R1013 Epigastric pain: Secondary | ICD-10-CM

## 2012-12-29 DIAGNOSIS — K219 Gastro-esophageal reflux disease without esophagitis: Secondary | ICD-10-CM

## 2012-12-29 DIAGNOSIS — K31819 Angiodysplasia of stomach and duodenum without bleeding: Secondary | ICD-10-CM | POA: Insufficient documentation

## 2012-12-29 DIAGNOSIS — Z8619 Personal history of other infectious and parasitic diseases: Secondary | ICD-10-CM

## 2012-12-29 LAB — CBC
Hemoglobin: 13.7 g/dL (ref 12.0–15.0)
MCHC: 33.5 g/dL (ref 30.0–36.0)
MCV: 81.7 fl (ref 78.0–100.0)
Platelets: 167 10*3/uL (ref 150.0–400.0)
RDW: 13.5 % (ref 11.5–14.6)

## 2012-12-29 LAB — COMPREHENSIVE METABOLIC PANEL
ALT: 41 U/L — ABNORMAL HIGH (ref 0–35)
AST: 32 U/L (ref 0–37)
Alkaline Phosphatase: 67 U/L (ref 39–117)
BUN: 18 mg/dL (ref 6–23)
Creatinine, Ser: 0.8 mg/dL (ref 0.4–1.2)
Total Bilirubin: 0.6 mg/dL (ref 0.3–1.2)

## 2012-12-29 MED ORDER — PANTOPRAZOLE SODIUM 40 MG PO TBEC: 40.0000 mg | DELAYED_RELEASE_TABLET | Freq: Every day | ORAL | Status: DC

## 2012-12-29 MED ORDER — DICYCLOMINE HCL 20 MG PO TABS: 20.0000 mg | ORAL_TABLET | ORAL | Status: DC | PRN

## 2012-12-29 MED ORDER — RANITIDINE HCL 150 MG PO TABS: 150.0000 mg | ORAL_TABLET | Freq: Two times a day (BID) | ORAL | Status: DC

## 2012-12-29 NOTE — Patient Instructions (Addendum)
We have sent the following medications to your pharmacy for you to pick up at your convenience: Omprazole 40 mg daily you can take Zantac for breakthrough reflux in the evenings. Bentyl  Your physician has requested that you go to the basement for the following lab work before leaving today: Hep B panels Hep C, cbc, cmp  Follow up with Dr. Rhea Belton in 3 months                                                 We are excited to introduce MyChart, a new best-in-class service that provides you online access to important information in your electronic medical record. We want to make it easier for you to view your health information - all in one secure location - when and where you need it. We expect MyChart will enhance the quality of care and service we provide.  When you register for MyChart, you can:    View your test results.    Request appointments and receive appointment reminders via email.    Request medication renewals.    View your medical history, allergies, medications and immunizations.    Communicate with your physician's office through a password-protected site.    Conveniently print information such as your medication lists.  To find out if MyChart is right for you, please talk to a member of our clinical staff today. We will gladly answer your questions about this free health and wellness tool.  If you are age 60 or older and want a member of your family to have access to your record, you must provide written consent by completing a proxy form available at our office. Please speak to our clinical staff about guidelines regarding accounts for patients younger than age 14.  As you activate your MyChart account and need any technical assistance, please call the MyChart technical support line at ([phone]) or email your question to [email or web address]. If you email your question(s), please include your name, a return phone number and the best time to reach  you.  If you have non-urgent health-related questions, you can send a message to our office through MyChart at [email or web address]. If you have a medical emergency, call 911.  Thank you for using MyChart as your new health and wellness resource!   MyChart licensed from [employer],  [id number]. Patents Pending.

## 2012-12-29 NOTE — Progress Notes (Addendum)
Subjective:    Patient ID: Jacqueline Orozco, female    DOB: 01-Nov-1950, 62 y.o.   MRN: [id number]  HPI Jacqueline Orozco is a 62 yo female with PMH of hypertension, hyperlipidemia constipation who is seen in followup. She was last seen in the office in May 2014 and came for upper endoscopy on 10/30/2012 to evaluate epigastric pain, nausea, and history of reflux. There was also report of possible melena. Upper endoscopy revealed a normal esophagus, a gastric angioectasia in an otherwise normal-appearing stomach and duodenum. A hemostatic clip was placed across the angioectasia gastric biopsies were benign with no evidence of H. pylori, metaplasia or dysplasia.  He has continued on pantoprazole 40 mg daily. She reports that her heartburn is significantly better with this medication though occasionally she does have breakthrough. She estimates her breakthrough heartburn occurs one day per week. She denies dysphagia. She does report intermittent lower abdominal cramping pain. This is worse in the morning. She was having issues with constipation and started MiraLax after her last office visit. She reports this has helped significantly and she is having 1-2 bowel movements per day.  Despite having more frequent bowel movements her abdominal cramping has persisted. She denies blood in her stool or melena.   We discussed that she had a history of hepatitis B. She says that this was years ago and to her knowledge was never treated.  Review of Systems As per history of present illness, otherwise negative  Current Medications, Allergies, Past Medical History, Past Surgical History, Family History and Social History were reviewed in [employer] record.     Objective:   Physical Exam BP 110/70  Pulse 64  Ht 5' 2.5" (1.588 m)  Wt 159 lb 6 oz (72.292 kg)  BMI 28.67 kg/m2 Constitutional: Well-developed and well-nourished. No distress. HEENT: Normocephalic and atraumatic. No scleral icterus. Neck:  Neck supple. Trachea midline. Cardiovascular: Normal rate, regular rhythm and intact distal pulses.  Pulmonary/chest: Effort normal and breath sounds normal. No wheezing, rales or rhonchi. Abdominal: Soft, nontender, nondistended. Bowel sounds active throughout.  Extremities: no clubbing, cyanosis, or edema Neurological: Alert and oriented to person place and time. Skin: Skin is warm and dry. No rashes noted. Psychiatric: Normal mood and affect. Behavior is normal.  CBC    Component Value Date/Time   WBC 6.1 10/06/2012 1344   RBC 4.96 10/06/2012 1344   HGB 13.5 10/06/2012 1344   HCT 39.8 10/06/2012 1344   PLT 161.0 10/06/2012 1344   MCV 80.1 10/06/2012 1344   MCH 27.0 03/19/2010 1820   MCHC 33.9 10/06/2012 1344   RDW 13.3 10/06/2012 1344   LYMPHSABS 1.9 03/12/2012 1025   MONOABS 0.3 03/12/2012 1025   EOSABS 0.2 03/12/2012 1025   BASOSABS 0.0 03/12/2012 1025    CMP     Component Value Date/Time   NA 140 10/06/2012 1344   K 4.2 10/06/2012 1344   CL 104 10/06/2012 1344   CO2 29 10/06/2012 1344   GLUCOSE 88 10/06/2012 1344   BUN 20 10/06/2012 1344   CREATININE 1.0 10/06/2012 1344   CALCIUM 9.2 10/06/2012 1344   PROT 7.1 10/06/2012 1344   ALBUMIN 4.0 10/06/2012 1344   AST 25 10/06/2012 1344   ALT 27 10/06/2012 1344   ALKPHOS 55 10/06/2012 1344   BILITOT 0.6 10/06/2012 1344   GFRNONAA 66.85 03/27/2010 0953   GFRAA  Value: >60        The eGFR has been calculated using the MDRD equation. This calculation has not  been validated in all clinical situations. eGFR's persistently <60 mL/min signify possible Chronic Kidney Disease. 03/19/2010 1820    Lipase     Component Value Date/Time   LIPASE 34.0 10/06/2012 1344       Assessment & Plan:  62 yo female with PMH of hypertension, hyperlipidemia constipation who is seen in followup.  1.  GERD -- improved with pantoprazole 40 mg daily. She will continue at this dose for now. I have recommended over-the-counter Zantac or Prevacid to be used on an  as-needed basis for breakthrough heartburn.  2.  Gastric angioectasia -- no evidence of further bleeding. Blood counts were normal in April and I will check them again today. Can be observed for now, unless evidence of bleeding returns  3.  Constipation but lower abdominal cramping -- the etiology of her lower abdominal cramping pain is unclear. It seems to be relieved by dicyclomine and she has asked for a refill of this medication. We discussed further workup including imaging, but at this time she would like to try Bentyl on an as-needed basis. I have asked that she let me know should this pain worsen or change in any way. She voices understanding. If it does we would likely proceed to cross-sectional imaging. She is up-to-date on colonoscopy, last having had a colonoscopy in 2011 with Dr. Bosie Clos. We are requesting the pathology results of the small polyps removed in 2011 to determine when she will be due for followup though it is likely 2016  4.  Hep B -- AST and ALT were normal in April, I will check them again today and also check hepatitis B serologies determine if she has active or latent infection. Perhaps she had resolved infection.  Addendum: Pathology from previous colonoscopies received from [facility], GI Path 08/29/2000 - tubular adenoma, no HGD Path 07/04/2009 - hyperplastic polyp  Recommendation- repeat colon for screening/surveillance given hx of adenomatous polyps, Jan 2016

## 2012-12-30 LAB — HEPATITIS B SURFACE ANTIGEN: Hepatitis B Surface Ag: NEGATIVE

## 2012-12-30 LAB — HEPATITIS C ANTIBODY: HCV Ab: NEGATIVE

## 2012-12-30 LAB — HEPATITIS B CORE ANTIBODY, TOTAL: Hep B Core Total Ab: POSITIVE — AB

## 2013-01-01 ENCOUNTER — Other Ambulatory Visit: Payer: Self-pay | Admitting: *Deleted

## 2013-01-11 ENCOUNTER — Other Ambulatory Visit: Payer: Self-pay | Admitting: Internal Medicine

## 2013-01-26 ENCOUNTER — Ambulatory Visit (INDEPENDENT_AMBULATORY_CARE_PROVIDER_SITE_OTHER): Payer: BC Managed Care – PPO | Admitting: Internal Medicine

## 2013-01-26 ENCOUNTER — Encounter: Payer: Self-pay | Admitting: Internal Medicine

## 2013-01-26 VITALS — BP 146/100 | HR 63 | Temp 97.7°F | Ht 62.5 in | Wt 160.0 lb

## 2013-01-26 DIAGNOSIS — M545 Low back pain, unspecified: Secondary | ICD-10-CM

## 2013-01-26 DIAGNOSIS — I10 Essential (primary) hypertension: Secondary | ICD-10-CM

## 2013-01-26 DIAGNOSIS — E785 Hyperlipidemia, unspecified: Secondary | ICD-10-CM

## 2013-01-26 DIAGNOSIS — Z0000 Encounter for general adult medical examination without abnormal findings: Secondary | ICD-10-CM

## 2013-01-26 MED ORDER — CYCLOBENZAPRINE HCL 5 MG PO TABS: 5.0000 mg | ORAL_TABLET | Freq: Three times a day (TID) | ORAL | Status: DC | PRN

## 2013-01-26 MED ORDER — OXYCODONE HCL 5 MG PO TABA: 1.0000 | ORAL_TABLET | Freq: Four times a day (QID) | ORAL | Status: DC | PRN

## 2013-01-26 MED ORDER — PREDNISONE 10 MG PO TABS: ORAL_TABLET | ORAL | Status: DC

## 2013-01-26 NOTE — Patient Instructions (Addendum)
Please take all new medication as prescribed - the pain medication (oxycodone), muscle relaxer, and prednisone Please continue all other medications as before, and refills have been done if requested. Please have the pharmacy call with any other refills you may need.  Please remember to sign up for My Chart if you have not done so, as this will be important to you in the future with finding out test results, communicating by private email, and scheduling acute appointments online when needed.  Please return in 3 months, or sooner if needed, with Lab testing done 3-5 days before

## 2013-01-26 NOTE — Assessment & Plan Note (Signed)
Lab Results  Component Value Date   LDLCALC 110* 03/12/2012   For lower chol diet, f/u next visit

## 2013-01-26 NOTE — Assessment & Plan Note (Signed)
Mild elev today likely situational but overall stable overall by history and exam, recent data reviewed with pt, and pt to continue medical treatment as before,  to f/u any worsening symptoms or concerns BP Readings from Last 3 Encounters:  01/26/13 146/100  12/29/12 110/70  10/30/12 138/76

## 2013-01-26 NOTE — Assessment & Plan Note (Signed)
C/w MSk strain +/- lumbar radiculitis, without other LE neuro change; for pain control, muscle relaxer prn, and predpack asd,  to f/u any worsening symptoms or concerns

## 2013-01-26 NOTE — Progress Notes (Signed)
Subjective:    Patient ID: Jacqueline Orozco, female    DOB: 15-Jul-1950, 62 y.o.   MRN: [id number]  HPI  Here with 2 wks onset mild to mod lower back pain, constant persistent, worse to twist at waist level left and right, with some radiation to the right buttock but no further and no LE pain or numbness, though has had some pain also to the right groin area, and no bowel or bladder change, fever, wt loss, gait change or falls. No prior hx of known lumbar dz, no obvious cause, has hx of cervical radiculitis type pain, now improved.  Pt denies chest pain, increased sob or doe, wheezing, orthopnea, PND, increased LE swelling, palpitations, dizziness or syncope.  Trying to follow lower chol diet, but has had some dietary lapsess Past Medical History  Diagnosis Date  . HEPATITIS B, CHRONIC 03/28/2010  . HYPERLIPIDEMIA 03/28/2010  . HYPERTENSION 03/28/2010  . Colon polyps   . Anemia    No past surgical history on file.  reports that she has never smoked. She has never used smokeless tobacco. She reports that she does not drink alcohol or use illicit drugs. family history includes Hypertension in her mother; Liver disease in her maternal uncle; Lung cancer in her maternal grandmother. Allergies  Allergen Reactions  . Aspirin   . Penicillins   . Streptomycin   . Tramadol Nausea Only   Current Outpatient Prescriptions on File Prior to Visit  Medication Sig Dispense Refill  . acyclovir (ZOVIRAX) 200 MG capsule 1 tab 5 times per day for 5 days as needed for recurrent cold sores  25 capsule  11  . aspirin 81 MG EC tablet Take 81 mg by mouth daily.        . calcium carbonate (TUMS - DOSED IN MG ELEMENTAL CALCIUM) 500 MG chewable tablet Chew 1 tablet by mouth as needed for heartburn.      . dicyclomine (BENTYL) 20 MG tablet Take 1 tablet (20 mg total) by mouth as needed.  30 tablet  0  . docusate sodium (COLACE) 100 MG capsule Take 100 mg by mouth as needed for constipation.      . metoprolol tartrate  (LOPRESSOR) 25 MG tablet TAKE ONE-HALF TABLET BY MOUTH TWICE DAILY  90 tablet  1  . nabumetone (RELAFEN) 500 MG tablet       . pantoprazole (PROTONIX) 40 MG tablet Take 1 tablet (40 mg total) by mouth daily.  90 tablet  3  . polyethylene glycol powder (GLYCOLAX/MIRALAX) powder Take 17 g by mouth daily.  255 g  3  . ranitidine (ZANTAC) 150 MG tablet Take 1 tablet (150 mg total) by mouth 2 (two) times daily.  30 tablet  6  . simvastatin (ZOCOR) 20 MG tablet TAKE ONE TABLET BY MOUTH EVERY DAY  90 tablet  1   No current facility-administered medications on file prior to visit.   Review of Systems VS noted,  Constitutional: Pt appears well-developed and well-nourished.  HENT: Head: NCAT.  Right Ear: External ear normal.  Left Ear: External ear normal.  Eyes: Conjunctivae and EOM are normal. Pupils are equal, round, and reactive to light.  Neck: Normal range of motion. Neck supple.  Cardiovascular: Normal rate and regular rhythm.   Pulmonary/Chest: Effort normal and breath sounds normal.  Abd:  Soft, NT, non-distended, + BS Neurological: Pt is alert. Not confused  Skin: Skin is warm. No erythema.  Psychiatric: Pt behavior is normal. Thought content normal.  Objective:   Physical Exam BP 146/100  Pulse 63  Temp(Src) 97.7 F (36.5 C) (Oral)  Ht 5' 2.5" (1.588 m)  Wt 160 lb (72.576 kg)  BMI 28.78 kg/m2  SpO2 99% VS noted, not ill appearing but uncomfortable Constitutional: Pt appears well-developed and well-nourished.  HENT: Head: NCAT.  Right Ear: External ear normal.  Left Ear: External ear normal.  Eyes: Conjunctivae and EOM are normal. Pupils are equal, round, and reactive to light.  Neck: Normal range of motion. Neck supple.  Cardiovascular: Normal rate and regular rhythm.   Pulmonary/Chest: Effort normal and breath sounds normal.  Abd:  Soft, NT, non-distended, + BS Spine nontender but tender area right upper buttock area Neurological: Pt is alert. Not confused ,  motor/sens/gait intact, dtr symmetric except dimished right patellar reflex Skin: Skin is warm. No erythema. No rash Psychiatric: Pt behavior is normal. Thought content normal.      Assessment & Plan:

## 2013-02-21 ENCOUNTER — Other Ambulatory Visit: Payer: Self-pay | Admitting: Internal Medicine

## 2013-04-15 ENCOUNTER — Other Ambulatory Visit: Payer: Self-pay | Admitting: Internal Medicine

## 2013-04-15 ENCOUNTER — Other Ambulatory Visit: Payer: Self-pay

## 2013-07-22 ENCOUNTER — Other Ambulatory Visit: Payer: Self-pay | Admitting: Internal Medicine

## 2013-08-06 ENCOUNTER — Encounter: Payer: Self-pay | Admitting: Internal Medicine

## 2013-08-06 ENCOUNTER — Ambulatory Visit (INDEPENDENT_AMBULATORY_CARE_PROVIDER_SITE_OTHER): Payer: BC Managed Care – PPO | Admitting: Internal Medicine

## 2013-08-06 VITALS — BP 130/82 | HR 77 | Temp 99.0°F | Wt 155.0 lb

## 2013-08-06 DIAGNOSIS — J209 Acute bronchitis, unspecified: Secondary | ICD-10-CM

## 2013-08-06 DIAGNOSIS — B001 Herpesviral vesicular dermatitis: Secondary | ICD-10-CM

## 2013-08-06 DIAGNOSIS — Z0000 Encounter for general adult medical examination without abnormal findings: Secondary | ICD-10-CM

## 2013-08-06 DIAGNOSIS — B009 Herpesviral infection, unspecified: Secondary | ICD-10-CM

## 2013-08-06 DIAGNOSIS — I10 Essential (primary) hypertension: Secondary | ICD-10-CM

## 2013-08-06 MED ORDER — ACYCLOVIR 200 MG PO CAPS: ORAL_CAPSULE | ORAL | Status: DC

## 2013-08-06 MED ORDER — OMEPRAZOLE 20 MG PO CPDR: 20.0000 mg | DELAYED_RELEASE_CAPSULE | Freq: Every day | ORAL | Status: DC

## 2013-08-06 MED ORDER — LEVOFLOXACIN 250 MG PO TABS: 250.0000 mg | ORAL_TABLET | Freq: Every day | ORAL | Status: DC

## 2013-08-06 MED ORDER — HYDROCODONE-HOMATROPINE 5-1.5 MG/5ML PO SYRP: 5.0000 mL | ORAL_SOLUTION | Freq: Four times a day (QID) | ORAL | Status: DC | PRN

## 2013-08-06 NOTE — Assessment & Plan Note (Addendum)
Ok for acyclovir asd

## 2013-08-06 NOTE — Assessment & Plan Note (Signed)
Mild to mod, for antibx course,  to f/u any worsening symptoms or concerns 

## 2013-08-06 NOTE — Progress Notes (Signed)
Subjective:    Patient ID: Jacqueline Orozco, female    DOB: Mar 19, 1951, 63 y.o.   MRN: [id number]  HPI Here with acute onset mild to mod 2-3 days ST, HA, general weakness and malaise, with prod cough greenish sputum, but Pt denies chest pain, increased sob or doe, wheezing, orthopnea, PND, increased LE swelling, palpitations, dizziness or syncope.  Also with new cold sore to right upper lip x 3 days with signficant pain and tender.  Also protonix not covered by insur - asks for change to omprazole. Denies worsening reflux, abd pain, dysphagia, n/v, bowel change or blood.  Past Medical History  Diagnosis Date  . HEPATITIS B, CHRONIC 03/28/2010  . HYPERLIPIDEMIA 03/28/2010  . HYPERTENSION 03/28/2010  . Colon polyps   . Anemia    No past surgical history on file.  reports that she has never smoked. She has never used smokeless tobacco. She reports that she does not drink alcohol or use illicit drugs. family history includes Hypertension in her mother; Liver disease in her maternal uncle; Lung cancer in her maternal grandmother. Allergies  Allergen Reactions  . Aspirin   . Penicillins   . Streptomycin   . Tramadol Nausea Only   Current Outpatient Prescriptions on File Prior to Visit  Medication Sig Dispense Refill  . aspirin 81 MG EC tablet Take 81 mg by mouth daily.        . calcium carbonate (TUMS - DOSED IN MG ELEMENTAL CALCIUM) 500 MG chewable tablet Chew 1 tablet by mouth as needed for heartburn.      . cyclobenzaprine (FLEXERIL) 5 MG tablet Take 1 tablet (5 mg total) by mouth 3 (three) times daily as needed for muscle spasms.  60 tablet  1  . dicyclomine (BENTYL) 20 MG tablet Take 1 tablet (20 mg total) by mouth as needed.  30 tablet  0  . docusate sodium (COLACE) 100 MG capsule Take 100 mg by mouth as needed for constipation.      . metoprolol tartrate (LOPRESSOR) 25 MG tablet TAKE ONE-HALF TABLET BY MOUTH TWICE DAILY  90 tablet  1  . nabumetone (RELAFEN) 500 MG tablet       .  polyethylene glycol powder (GLYCOLAX/MIRALAX) powder TAKE 17 G BY MOUTH DAILY.  255 g  3  . ranitidine (ZANTAC) 150 MG tablet Take 1 tablet (150 mg total) by mouth 2 (two) times daily.  30 tablet  6  . simvastatin (ZOCOR) 20 MG tablet TAKE ONE TABLET BY MOUTH EVERY DAY  90 tablet  1   No current facility-administered medications on file prior to visit.      Review of Systems  Constitutional: Negative for unexpected weight change, or unusual diaphoresis  HENT: Negative for tinnitus.   Eyes: Negative for photophobia and visual disturbance.  Respiratory: Negative for choking and stridor.   Gastrointestinal: Negative for vomiting and blood in stool.  Genitourinary: Negative for hematuria and decreased urine volume.  Musculoskeletal: Negative for acute joint swelling Skin: Negative for color change and wound.  Neurological: Negative for tremors and numbness other than noted  Psychiatric/Behavioral: Negative for decreased concentration or  hyperactivity.       Objective:   Physical Exam BP 130/82  Pulse 77  Temp(Src) 99 F (37.2 C) (Oral)  Wt 155 lb (70.308 kg)  SpO2 98% VS noted, mild ill Constitutional: Pt appears well-developed and well-nourished.  HENT: Head: NCAT.  Right Ear: External ear normal.  Left Ear: External ear normal.  Bilat tm's with mild  erythema.  Max sinus areas non tender.  Pharynx with mild erythema, no exudate Eyes: Conjunctivae and EOM are normal. Pupils are equal, round, and reactive to light.  Neck: Normal range of motion. Neck supple.  Cardiovascular: Normal rate and regular rhythm.   Pulmonary/Chest: Effort normal and breath sounds normal.  Neurological: Pt is alert. Not confused  Skin: Skin is warm. Has grouped vesicles on erythem base right upper lip Psychiatric: Pt behavior is normal. Thought content normal.      Assessment & Plan:

## 2013-08-06 NOTE — Patient Instructions (Signed)
Please take all new medication as prescribed - the 2 antibiotic, and cough medicine Please continue all other medications as before Please have the pharmacy call with any other refills you may need.  You are given the work note  Please return in 3 months, or sooner if needed

## 2013-08-06 NOTE — Assessment & Plan Note (Signed)
stable overall by history and exam, recent data reviewed with pt, and pt to continue medical treatment as before,  to f/u any worsening symptoms or concerns BP Readings from Last 3 Encounters:  08/06/13 130/82  01/26/13 146/100  12/29/12 110/70

## 2013-08-06 NOTE — Progress Notes (Signed)
Pre visit review using our clinic review tool, if applicable. No additional management support is needed unless otherwise documented below in the visit note. 

## 2013-09-14 ENCOUNTER — Telehealth: Payer: Self-pay | Admitting: Internal Medicine

## 2013-09-14 NOTE — Telephone Encounter (Signed)
Left message for patient to call back I have scheduled follow up for 09/17/13

## 2013-09-14 NOTE — Telephone Encounter (Signed)
lmtcb

## 2013-09-15 NOTE — Telephone Encounter (Signed)
Patient notified appt with Dr. Hilarie Fredrickson for 09/17/13 3:00

## 2013-09-15 NOTE — Telephone Encounter (Signed)
Left message for patient to call back  

## 2013-09-17 ENCOUNTER — Ambulatory Visit (INDEPENDENT_AMBULATORY_CARE_PROVIDER_SITE_OTHER): Payer: BC Managed Care – PPO | Admitting: Internal Medicine

## 2013-09-17 ENCOUNTER — Encounter: Payer: Self-pay | Admitting: Internal Medicine

## 2013-09-17 VITALS — BP 142/90 | HR 80 | Ht 62.5 in | Wt 157.4 lb

## 2013-09-17 DIAGNOSIS — K589 Irritable bowel syndrome without diarrhea: Secondary | ICD-10-CM

## 2013-09-17 DIAGNOSIS — Z86010 Personal history of colonic polyps: Secondary | ICD-10-CM

## 2013-09-17 DIAGNOSIS — Z8619 Personal history of other infectious and parasitic diseases: Secondary | ICD-10-CM

## 2013-09-17 DIAGNOSIS — K219 Gastro-esophageal reflux disease without esophagitis: Secondary | ICD-10-CM

## 2013-09-17 MED ORDER — DICYCLOMINE HCL 20 MG PO TABS: 20.0000 mg | ORAL_TABLET | Freq: Two times a day (BID) | ORAL | Status: DC | PRN

## 2013-09-17 MED ORDER — OMEPRAZOLE 40 MG PO CPDR: 40.0000 mg | DELAYED_RELEASE_CAPSULE | Freq: Every day | ORAL | Status: DC

## 2013-09-17 NOTE — Patient Instructions (Signed)
We have sent the following medications to your pharmacy for you to pick up at your convenience: Omeprazole 40 mg daily, bentyl 20 mg twice a day  Follow up with Dr. Hilarie Fredrickson in office in 3 months

## 2013-09-17 NOTE — Progress Notes (Signed)
Subjective:    Patient ID: Jacqueline Orozco, female    DOB: 06-15-1950, 63 y.o.   MRN: [id number]  HPI Jacqueline Orozco is a 63 yo female with PMH of gastric angiodysplasia, GERD, adenomatous colon polyp, IBS constipation with lower abdominal cramping who is seen in followup. She is here alone today. She reports that her reflux disease is not well controlled with omeprazole 20 mg daily. She was taken pantoprazole 40 mg daily but due to insurance purposes she could not afford this medicine. She switch back to OTC omeprazole 20 mg she's had some breakthrough evening heartburn which she is treated with TUMS. She denies dysphagia or odynophagia. No nausea or vomiting. She does have constipation when she is not using MiraLax. Constipation has not been a big issue for her lately but she has had bilateral lower abdominal cramping. Previously she was using Bentyl which helped significantly with his pain but has run out of this medication. She denies rectal bleeding or blood in her stool.  She was diagnosed with bronchitis and missed approximately one month of work. She is back to work now. She denies dyspnea or chest pain today   Review of Systems As per history of present illness, otherwise negative  Current Medications, Allergies, Past Medical History, Past Surgical History, Family History and Social History were reviewed in [employer] record.     Objective:   Physical Exam BP 142/90  Pulse 80  Ht 5' 2.5" (1.588 m)  Wt 157 lb 6.4 oz (71.396 kg)  BMI 28.31 kg/m2  SpO2 98% Constitutional: Well-developed and well-nourished. No distress. HEENT: Normocephalic and atraumatic.No scleral icterus. Neck: Neck supple. Trachea midline. Cardiovascular: Normal rate, regular rhythm and intact distal pulses.  Pulmonary/chest: Effort normal and breath sounds normal. No wheezing, rales or rhonchi. Abdominal: Soft, nontender, nondistended. Bowel sounds active throughout.  Extremities: no clubbing,  cyanosis, or edema Neurological: Alert and oriented to person place and time. Skin: Skin is warm and dry. No rashes noted. Psychiatric: Normal mood and affect. Behavior is normal.  Pathology from previous colonoscopies received from [facility], GI  Path 08/29/2000 - tubular adenoma, no HGD  Path 07/04/2009 - hyperplastic polyp  CBC    Component Value Date/Time   WBC 7.9 12/29/2012 1000   RBC 4.99 12/29/2012 1000   HGB 13.7 12/29/2012 1000   HCT 40.8 12/29/2012 1000   PLT 167.0 12/29/2012 1000   MCV 81.7 12/29/2012 1000   MCH 27.0 03/19/2010 1820   MCHC 33.5 12/29/2012 1000   RDW 13.5 12/29/2012 1000   LYMPHSABS 1.9 03/12/2012 1025   MONOABS 0.3 03/12/2012 1025   EOSABS 0.2 03/12/2012 1025   BASOSABS 0.0 03/12/2012 1025      Assessment & Plan:  63 yo female with PMH of gastric angiodysplasia, GERD, adenomatous colon polyp, IBS constipation with lower abdominal cramping who is seen in followup.\  1. GERD -- breakthrough heartburn since changing from pantoprazole 40 mg daily to omeprazole 20 mg daily. At this time I will increase her omeprazole to 40 mg once daily. Prescription will be given for this today. No alarm symptoms to warrant repeat upper endoscopy  2.  Hx of gastric AVM -- Hemoclip placed at last upper endoscopy. No evidence for GI bleeding or anemia. Nothing further for this issue at present  3.  IBS -- lower abdominal cramping is felt to be curable bowel related. Previous excellent response to Bentyl. I will give her Bentyl 20 mg to be used every 4-6 hours as needed.  I encouraged her to eat a well-balanced diet that is low in gas forming foods.  4.  Hx of adenomatous colon polyps -- last colonoscopy January 2011, recommend repeat interval 5 years. She asked that this be done later this year which is reasonable. We'll plan repeat surveillance colonoscopy given his history in late 2015  5.  Hep B history -- hepatitis B viral serologies reviewed. Core total positive, surface antibody  positive, this indicates immunity due to natural infection. No evidence for active infection.  3 month followup

## 2013-09-22 ENCOUNTER — Telehealth: Payer: Self-pay | Admitting: Internal Medicine

## 2013-09-22 NOTE — Telephone Encounter (Signed)
Patient reports a new right lower quad abdominal pain.  States is difficult to ambulate due to the pain.  She was in the office on 09/17/13 and this is not the same pain she had then.  Dicyclomine is not helping the pain.  She will come in and see Nicoletta Ba PA today at 2:00

## 2013-09-23 ENCOUNTER — Ambulatory Visit (INDEPENDENT_AMBULATORY_CARE_PROVIDER_SITE_OTHER): Payer: BC Managed Care – PPO | Admitting: Physician Assistant

## 2013-09-23 ENCOUNTER — Other Ambulatory Visit (INDEPENDENT_AMBULATORY_CARE_PROVIDER_SITE_OTHER): Payer: BC Managed Care – PPO

## 2013-09-23 ENCOUNTER — Encounter: Payer: Self-pay | Admitting: Physician Assistant

## 2013-09-23 VITALS — BP 130/90 | HR 72 | Ht 61.25 in | Wt 157.1 lb

## 2013-09-23 DIAGNOSIS — R109 Unspecified abdominal pain: Secondary | ICD-10-CM

## 2013-09-23 DIAGNOSIS — R300 Dysuria: Secondary | ICD-10-CM

## 2013-09-23 DIAGNOSIS — R1032 Left lower quadrant pain: Secondary | ICD-10-CM

## 2013-09-23 DIAGNOSIS — R1031 Right lower quadrant pain: Secondary | ICD-10-CM

## 2013-09-23 LAB — URINALYSIS
BILIRUBIN URINE: NEGATIVE
Hgb urine dipstick: NEGATIVE
KETONES UR: NEGATIVE
LEUKOCYTES UA: NEGATIVE
NITRITE: NEGATIVE
PH: 6 (ref 5.0–8.0)
Specific Gravity, Urine: <=1.005 — AB (ref 1.000–1.030)
TOTAL PROTEIN, URINE-UPE24: NEGATIVE
Urine Glucose: NEGATIVE
Urobilinogen, UA: 0.2 (ref 0.0–1.0)

## 2013-09-23 LAB — CBC WITH DIFFERENTIAL/PLATELET
BASOS ABS: 0 10*3/uL (ref 0.0–0.1)
Basophils Relative: 0.4 % (ref 0.0–3.0)
EOS PCT: 1.8 % (ref 0.0–5.0)
Eosinophils Absolute: 0.1 10*3/uL (ref 0.0–0.7)
HEMATOCRIT: 39.8 % (ref 36.0–46.0)
HEMOGLOBIN: 13.4 g/dL (ref 12.0–15.0)
LYMPHS ABS: 2.5 10*3/uL (ref 0.7–4.0)
LYMPHS PCT: 31.1 % (ref 12.0–46.0)
MCHC: 33.7 g/dL (ref 30.0–36.0)
MCV: 80 fl (ref 78.0–100.0)
MONOS PCT: 6 % (ref 3.0–12.0)
Monocytes Absolute: 0.5 10*3/uL (ref 0.1–1.0)
NEUTROS ABS: 4.8 10*3/uL (ref 1.4–7.7)
Neutrophils Relative %: 60.7 % (ref 43.0–77.0)
Platelets: 182 10*3/uL (ref 150.0–400.0)
RBC: 4.98 Mil/uL (ref 3.87–5.11)
RDW: 14.2 % (ref 11.5–14.6)
WBC: 7.9 10*3/uL (ref 4.5–10.5)

## 2013-09-23 MED ORDER — TRAMADOL HCL 50 MG PO TABS: ORAL_TABLET | ORAL | Status: DC

## 2013-09-23 NOTE — Progress Notes (Addendum)
Subjective:    Patient ID: Jacqueline Orozco, female    DOB: [birth year], 63 y.o.   MRN: [id number]  HPI  Jacqueline Orozco is a pleasant 63 year old Asian female known to Dr. Hilarie Fredrickson who was just seen in the office last week. She has history of gastric AVMs, GERD, adenomatous colon polyps IBS and constipation. At the time her last office visit she was complaining of breakthrough reflux symptoms and was switched to higher dose omeprazole at 40 mg by mouth daily. She was also having IBS symptoms and was started on bentyl  every 4-6 hours as needed for abdominal cramping and discomfort.  Patient comes in today with new complaint of bilateral groin pain which she says feels different than her IBS-type symptoms. She says she has been having pain over the past several days and that is not constant but aggravated by sitting walking or changing position. She says she stands all day at work and used to do a lot of heavy lifting still has to do some pulling but not such heavy lifting. She is not aware of any injury muscle pull etc. Her bowel movements have been normal. She says she's been eating without difficulty no nausea or vomiting. She has had some mild dysuria and burning. No fever no hematuria. She is not aware of any history of kidney stones. She points to the bilateral anterior upper thigh as the main area of pain.    Review of Systems  Constitutional: Negative.   HENT: Negative.   Eyes: Negative.   Respiratory: Negative.   Cardiovascular: Negative.   Gastrointestinal: Positive for abdominal pain.  Endocrine: Negative.   Genitourinary: Positive for dysuria.  Musculoskeletal: Positive for arthralgias and gait problem.  Allergic/Immunologic: Negative.   Neurological: Negative.   Hematological: Negative.   Psychiatric/Behavioral: Negative.    Outpatient Prescriptions Prior to Visit  Medication Sig Dispense Refill  . acyclovir (ZOVIRAX) 200 MG capsule 1 tab 5 times per day for 5 days as needed for recurrent cold  sores  25 capsule  11  . calcium carbonate (TUMS - DOSED IN MG ELEMENTAL CALCIUM) 500 MG chewable tablet Chew 1 tablet by mouth as needed for heartburn.      . cyclobenzaprine (FLEXERIL) 5 MG tablet Take 1 tablet (5 mg total) by mouth 3 (three) times daily as needed for muscle spasms.  60 tablet  1  . dicyclomine (BENTYL) 20 MG tablet Take 1 tablet (20 mg total) by mouth 2 (two) times daily as needed.  30 tablet  40  . HYDROcodone-homatropine (HYCODAN) 5-1.5 MG/5ML syrup Take 5 mLs by mouth every 6 (six) hours as needed for cough.  180 mL  0  . metoprolol tartrate (LOPRESSOR) 25 MG tablet TAKE ONE-HALF TABLET BY MOUTH TWICE DAILY  90 tablet  1  . omeprazole (PRILOSEC) 40 MG capsule Take 1 capsule (40 mg total) by mouth daily.  90 capsule  3  . simvastatin (ZOCOR) 20 MG tablet TAKE ONE TABLET BY MOUTH EVERY DAY  90 tablet  1  . aspirin 81 MG EC tablet Take 81 mg by mouth daily.        Marland Kitchen docusate sodium (COLACE) 100 MG capsule Take 100 mg by mouth as needed for constipation.      . ranitidine (ZANTAC) 150 MG tablet Take 1 tablet (150 mg total) by mouth 2 (two) times daily.  30 tablet  6   No facility-administered medications prior to visit.   Allergies  Allergen Reactions  . Aspirin   .  Penicillins   . Streptomycin   . Tramadol Nausea Only     Patient Active Problem List   Diagnosis Date Noted  . Acute bronchitis 08/06/2013  . Cold sore 08/06/2013  . Lower back pain 01/26/2013  . Gastric AVM 12/29/2012  . Hx of adenomatous colonic polyps 10/16/2012  . Plantar fasciitis, right 05/19/2012  . Cervical radiculitis 02/12/2012  . Vertigo 05/22/2011  . Preventative health care 08/30/2010  . ABDOMINAL PAIN OTHER SPECIFIED SITE 08/16/2010  . HEPATITIS B, CHRONIC 03/28/2010  . HYPERLIPIDEMIA 03/28/2010  . HYPERTENSION 03/28/2010  . CONSTIPATION 03/28/2010   History  Substance Use Topics  . Smoking status: Never Smoker   . Smokeless tobacco: Never Used  . Alcohol Use: No   family  history includes Hypertension in her mother; Liver disease in her maternal uncle; Lung cancer in her maternal grandmother.     Objective:   Physical Exam  well-developed Asian female in no acute distress blood pressure 130/90 pulse 72 height 5 foot 1 weight 157. HEEN; nontraumatic normocephalic EOMI PERRLA sclera anicteric, Supple; no JVD, Cardiovascular; regular rate and rhythm with S1-S2 no murmur or gallop, Pulmonary; clear bilaterally, Abdomen; soft she is mildly tender in the suprapubic area and also mildly tender over the pubic bone no palpable adenopathy and no tenderness in her upper thighs, Rectal; exam not done, Extremities; no clubbing cyanosis or edema skin warm and dry, Psych ;mood and affect appropriate        Assessment & Plan:  #24  63 year old female with one-week history of vague dysuria and suprapubic discomfort as well as bilateral groin discomfort aggravated by movement. I suspect that she has a groin pull or musculoskeletal etiology for her discomfort but will rule out urinary tract infection. Do not think this is a GI specific issue #2 IBS #3 history of colon polyps-patient is to have followup colonoscopy late 2015 #4 GERD-stable on omeprazole 40 mg by mouth daily  Plan; continue Bentyl 10 mg every 4-6 hours as needed for abdominal cramping Continue omeprazole 40 mg by mouth daily Check CBC with differential and UA Advised moist heat to the groin 4-5 times daily Ultram 50 mg every 6-8 hours as needed for pain She is advised should symptoms persist and UA negative she will need to see her primary care provider. Colonoscopy planned for later in 2015 with Dr. Hilarie Fredrickson  Addendum: Reviewed and agree with initial management. Jerene Bears, MD

## 2013-09-23 NOTE — Patient Instructions (Signed)
Please go to the basement level to have your labs drawn.  We faxed a prescription to [facility] for pain, Tramadol.  Use a heating pad to your groin area.  If your symptoms persist into next week, call Dr. Gwynn Burly office to make an appointment.

## 2013-09-24 DIAGNOSIS — Z0279 Encounter for issue of other medical certificate: Secondary | ICD-10-CM

## 2013-09-29 ENCOUNTER — Ambulatory Visit (INDEPENDENT_AMBULATORY_CARE_PROVIDER_SITE_OTHER): Payer: BC Managed Care – PPO | Admitting: Internal Medicine

## 2013-09-29 ENCOUNTER — Encounter: Payer: Self-pay | Admitting: Internal Medicine

## 2013-09-29 VITALS — BP 140/86 | HR 64 | Temp 97.1°F | Resp 20 | Ht 61.25 in | Wt 156.0 lb

## 2013-09-29 DIAGNOSIS — M519 Unspecified thoracic, thoracolumbar and lumbosacral intervertebral disc disorder: Secondary | ICD-10-CM | POA: Insufficient documentation

## 2013-09-29 DIAGNOSIS — R1031 Right lower quadrant pain: Secondary | ICD-10-CM | POA: Insufficient documentation

## 2013-09-29 DIAGNOSIS — R109 Unspecified abdominal pain: Secondary | ICD-10-CM

## 2013-09-29 DIAGNOSIS — M545 Low back pain, unspecified: Secondary | ICD-10-CM | POA: Insufficient documentation

## 2013-09-29 DIAGNOSIS — I10 Essential (primary) hypertension: Secondary | ICD-10-CM

## 2013-09-29 DIAGNOSIS — R1032 Left lower quadrant pain: Secondary | ICD-10-CM

## 2013-09-29 HISTORY — DX: Unspecified thoracic, thoracolumbar and lumbosacral intervertebral disc disorder: M51.9

## 2013-09-29 NOTE — Patient Instructions (Signed)
Please continue all other medications as before Please have the pharmacy call with any other refills you may need.  Please go to the XRAY Department in the Basement (go straight as you get off the elevator) for the x-ray testing tomorrow  You will be contacted regarding the referral for: Dr Smith/sports medicine - for next available  You are given the work note as well

## 2013-09-29 NOTE — Progress Notes (Signed)
Pre-visit discussion using our clinic review tool. No additional management support is needed unless otherwise documented below in the visit note.  

## 2013-09-29 NOTE — Progress Notes (Signed)
Subjective:    Patient ID: Jacqueline Orozco, female    DOB: 13-Apr-1951, 63 y.o.   MRN: [id number]  HPI  Here with 2 wks onset worsening LBP and bilat groin/inner thigh pain,sharp without radiation; Pt without bowel or bladder change, fever, wt loss,  worsening LE pain/numbness/weakness, gait change or falls.  Tramadol helps at night, but cant take during the day due to dizziness/loopiness.  Most recently worse to the point she can only sit for a few minutes.  Stands all day at work with much push/pulling, and has asked for help from her employer but no help hired.  Tylenol helps some during the day.  Recent cbc/ua normal, has seen and not thought to be GI source of pain Past Medical History  Diagnosis Date  . HEPATITIS B, CHRONIC 03/28/2010  . HYPERLIPIDEMIA 03/28/2010  . HYPERTENSION 03/28/2010  . Colon polyps   . Anemia   . Lumbar disc disease 09/29/2013   No past surgical history on file.  reports that she has never smoked. She has never used smokeless tobacco. She reports that she does not drink alcohol or use illicit drugs. family history includes Hypertension in her mother; Liver disease in her maternal uncle; Lung cancer in her maternal grandmother. Allergies  Allergen Reactions  . Aspirin   . Penicillins   . Streptomycin    Current Outpatient Prescriptions on File Prior to Visit  Medication Sig Dispense Refill  . acetaminophen (TYLENOL) 500 MG tablet Take 500 mg by mouth 2 (two) times daily.      Marland Kitchen acyclovir (ZOVIRAX) 200 MG capsule 1 tab 5 times per day for 5 days as needed for recurrent cold sores  25 capsule  11  . Ca Carbonate-Mag Hydroxide (ROLAIDS PO) Take by mouth as needed.      . calcium carbonate (TUMS - DOSED IN MG ELEMENTAL CALCIUM) 500 MG chewable tablet Chew 1 tablet by mouth as needed for heartburn.      . cyclobenzaprine (FLEXERIL) 5 MG tablet Take 1 tablet (5 mg total) by mouth 3 (three) times daily as needed for muscle spasms.  60 tablet  1  . dicyclomine (BENTYL) 20  MG tablet Take 1 tablet (20 mg total) by mouth 2 (two) times daily as needed.  30 tablet  40  . Krill Oil 300 MG CAPS Take 1 capsule by mouth daily.      . metoprolol tartrate (LOPRESSOR) 25 MG tablet TAKE ONE-HALF TABLET BY MOUTH TWICE DAILY  90 tablet  1  . Multiple Vitamins-Minerals (CENTRUM ADULTS PO) Take 1 tablet by mouth daily.      Marland Kitchen omeprazole (PRILOSEC) 40 MG capsule Take 1 capsule (40 mg total) by mouth daily.  90 capsule  3  . polyethylene glycol powder (GLYCOLAX/MIRALAX) powder Take 17 g by mouth once.      . ranitidine (ZANTAC) 150 MG tablet Take 150 mg by mouth as needed.      . simvastatin (ZOCOR) 20 MG tablet TAKE ONE TABLET BY MOUTH EVERY DAY  90 tablet  1  . traMADol (ULTRAM) 50 MG tablet Take 1 tab every 6-8 hours as needed for pain.  30 tablet  0  . HYDROcodone-homatropine (HYCODAN) 5-1.5 MG/5ML syrup Take 5 mLs by mouth every 6 (six) hours as needed for cough.  180 mL  0   No current facility-administered medications on file prior to visit.   Review of Systems  Constitutional: Negative for unexpected weight change, or unusual diaphoresis  HENT: Negative for tinnitus.  Eyes: Negative for photophobia and visual disturbance.  Respiratory: Negative for choking and stridor.   Gastrointestinal: Negative for vomiting and blood in stool.  Genitourinary: Negative for hematuria and decreased urine volume.  Musculoskeletal: Negative for acute joint swelling Skin: Negative for color change and wound.  Neurological: Negative for tremors and numbness other than noted  Psychiatric/Behavioral: Negative for decreased concentration or  hyperactivity.       Objective:   Physical Exam BP 140/86  Pulse 64  Temp(Src) 97.1 F (36.2 C) (Oral)  Resp 20  Ht 5' 1.25" (1.556 m)  Wt 156 lb (70.761 kg)  BMI 29.23 kg/m2  SpO2 97% VS noted, not ill appearing but in discomfort with sitting and standing up Constitutional: Pt appears well-developed and well-nourished.  HENT: Head: NCAT.    Right Ear: External ear normal.  Left Ear: External ear normal.  Eyes: Conjunctivae and EOM are normal. Pupils are equal, round, and reactive to light.  Neck: Normal range of motion. Neck supple.  Cardiovascular: Normal rate and regular rhythm.   Pulmonary/Chest: Effort normal and breath sounds normal.  Abd:  Soft, NT, non-distended, + BS Spine - mild tender midlline lower lumbar with? bilat paravertebral tender/spasm Groin bilat without mass/swelling Bilat hips with near FROM, some pain with int/ext hip rotation bilat, no tender over bilat greater trochanter Neurological: Pt is alert. Not confused , motor 5/5 Skin: Skin is warm. No erythema.  Psychiatric: Pt behavior is normal. Thought content normal.     Assessment & Plan:

## 2013-09-30 ENCOUNTER — Ambulatory Visit (INDEPENDENT_AMBULATORY_CARE_PROVIDER_SITE_OTHER)
Admission: RE | Admit: 2013-09-30 | Discharge: 2013-09-30 | Disposition: A | Discharge status: Home / Self Care | Payer: BC Managed Care – PPO | Source: Ambulatory Visit | Attending: Internal Medicine | Admitting: Internal Medicine

## 2013-09-30 DIAGNOSIS — R1032 Left lower quadrant pain: Secondary | ICD-10-CM

## 2013-09-30 DIAGNOSIS — M545 Low back pain, unspecified: Secondary | ICD-10-CM

## 2013-09-30 DIAGNOSIS — R1031 Right lower quadrant pain: Secondary | ICD-10-CM

## 2013-09-30 DIAGNOSIS — R109 Unspecified abdominal pain: Secondary | ICD-10-CM

## 2013-09-30 NOTE — Assessment & Plan Note (Signed)
Also for hip/pelvis films, cont same tx, refer sport med;  Suspect most likely soft tissue msk strain due to overuse, work note given

## 2013-09-30 NOTE — Assessment & Plan Note (Signed)
Suspect flare of underlying prob lumbar djd/ddd - cont current tx, neuro exam no change, for plain films, refer sport med for eval, work note given

## 2013-09-30 NOTE — Assessment & Plan Note (Signed)
stable overall by history and exam, recent data reviewed with pt, and pt to continue medical treatment as before,  to f/u any worsening symptoms or concerns BP Readings from Last 3 Encounters:  09/29/13 140/86  09/23/13 130/90  09/17/13 142/90

## 2013-10-01 ENCOUNTER — Ambulatory Visit: Payer: BC Managed Care – PPO | Admitting: Family Medicine

## 2013-10-01 DIAGNOSIS — Z0289 Encounter for other administrative examinations: Secondary | ICD-10-CM

## 2013-10-05 ENCOUNTER — Telehealth: Payer: Self-pay | Admitting: Physician Assistant

## 2013-10-05 NOTE — Telephone Encounter (Signed)
Patient states that tramadol causes nausea and vomiting.  She would like an alternative pain medication.  Nicoletta Ba PA please advise

## 2013-10-05 NOTE — Telephone Encounter (Signed)
This was for Musculoskeletal pain ,and she saw Dr. Jenny Reichmann  Since so she  Should please call him for alternative pain med

## 2013-10-06 NOTE — Telephone Encounter (Signed)
Patient notified

## 2014-02-06 ENCOUNTER — Other Ambulatory Visit: Payer: Self-pay | Admitting: Internal Medicine

## 2014-03-09 ENCOUNTER — Ambulatory Visit: Payer: Self-pay | Attending: Internal Medicine

## 2014-04-28 ENCOUNTER — Ambulatory Visit: Payer: BC Managed Care – PPO | Attending: Internal Medicine

## 2014-06-20 ENCOUNTER — Encounter: Payer: Self-pay | Admitting: Family Medicine

## 2014-06-20 ENCOUNTER — Ambulatory Visit: Payer: No Typology Code available for payment source | Attending: Family Medicine | Admitting: Family Medicine

## 2014-06-20 VITALS — BP 158/93 | HR 60 | Temp 98.2°F | Resp 16 | Ht 62.5 in | Wt 161.0 lb

## 2014-06-20 DIAGNOSIS — Q2733 Arteriovenous malformation of digestive system vessel: Secondary | ICD-10-CM

## 2014-06-20 DIAGNOSIS — D649 Anemia, unspecified: Secondary | ICD-10-CM | POA: Insufficient documentation

## 2014-06-20 DIAGNOSIS — E785 Hyperlipidemia, unspecified: Secondary | ICD-10-CM

## 2014-06-20 DIAGNOSIS — Z0000 Encounter for general adult medical examination without abnormal findings: Secondary | ICD-10-CM

## 2014-06-20 DIAGNOSIS — R109 Unspecified abdominal pain: Secondary | ICD-10-CM | POA: Insufficient documentation

## 2014-06-20 DIAGNOSIS — K31819 Angiodysplasia of stomach and duodenum without bleeding: Secondary | ICD-10-CM

## 2014-06-20 DIAGNOSIS — K589 Irritable bowel syndrome without diarrhea: Secondary | ICD-10-CM

## 2014-06-20 DIAGNOSIS — Z8619 Personal history of other infectious and parasitic diseases: Secondary | ICD-10-CM

## 2014-06-20 DIAGNOSIS — M5412 Radiculopathy, cervical region: Secondary | ICD-10-CM

## 2014-06-20 DIAGNOSIS — I10 Essential (primary) hypertension: Secondary | ICD-10-CM

## 2014-06-20 DIAGNOSIS — K5900 Constipation, unspecified: Secondary | ICD-10-CM | POA: Insufficient documentation

## 2014-06-20 LAB — POCT URINALYSIS DIPSTICK
BILIRUBIN UA: NEGATIVE
Glucose, UA: NEGATIVE
Ketones, UA: NEGATIVE
Leukocytes, UA: NEGATIVE
NITRITE UA: NEGATIVE
PH UA: 5.5
Protein, UA: NEGATIVE
RBC UA: NEGATIVE
Spec Grav, UA: >=1.03
Urobilinogen, UA: 0.2

## 2014-06-20 LAB — COMPLETE METABOLIC PANEL WITH GFR
ALBUMIN: 4.3 g/dL (ref 3.5–5.2)
ALK PHOS: 67 U/L (ref 39–117)
ALT: 20 U/L (ref 0–35)
AST: 20 U/L (ref 0–37)
BILIRUBIN TOTAL: 0.6 mg/dL (ref 0.2–1.2)
BUN: 17 mg/dL (ref 6–23)
CO2: 28 mEq/L (ref 19–32)
Calcium: 9.6 mg/dL (ref 8.4–10.5)
Chloride: 102 mEq/L (ref 96–112)
Creat: 0.89 mg/dL (ref 0.50–1.10)
GFR, Est African American: 81 mL/min
GFR, Est Non African American: 70 mL/min
Glucose, Bld: 89 mg/dL (ref 70–99)
POTASSIUM: 4 meq/L (ref 3.5–5.3)
Sodium: 140 mEq/L (ref 135–145)
TOTAL PROTEIN: 7.4 g/dL (ref 6.0–8.3)

## 2014-06-20 LAB — CBC
HEMATOCRIT: 41.9 % (ref 36.0–46.0)
HEMOGLOBIN: 13.9 g/dL (ref 12.0–15.0)
MCH: 26.9 pg (ref 26.0–34.0)
MCHC: 33.2 g/dL (ref 30.0–36.0)
MCV: 81 fL (ref 78.0–100.0)
MPV: 9.9 fL (ref 8.6–12.4)
Platelets: 196 10*3/uL (ref 150–400)
RBC: 5.17 MIL/uL — ABNORMAL HIGH (ref 3.87–5.11)
RDW: 14.2 % (ref 11.5–15.5)
WBC: 5.8 10*3/uL (ref 4.0–10.5)

## 2014-06-20 LAB — LIPID PANEL
Cholesterol: 214 mg/dL — ABNORMAL HIGH (ref 0–200)
HDL: 58 mg/dL (ref >39)
LDL Cholesterol: 124 mg/dL — ABNORMAL HIGH (ref 0–99)
TRIGLYCERIDES: 160 mg/dL — AB (ref <150)
Total CHOL/HDL Ratio: 3.7 Ratio
VLDL: 32 mg/dL (ref 0–40)

## 2014-06-20 LAB — TSH: TSH: 1.051 u[IU]/mL (ref 0.350–4.500)

## 2014-06-20 LAB — LIPASE: Lipase: 30 U/L (ref 0–75)

## 2014-06-20 MED ORDER — OMEPRAZOLE 40 MG PO CPDR: 40.0000 mg | DELAYED_RELEASE_CAPSULE | Freq: Every day | ORAL | Status: DC

## 2014-06-20 MED ORDER — HYDROCHLOROTHIAZIDE 12.5 MG PO TABS: 12.5000 mg | ORAL_TABLET | Freq: Every day | ORAL | Status: DC

## 2014-06-20 MED ORDER — ACETAMINOPHEN ER 650 MG PO TBCR: 650.0000 mg | EXTENDED_RELEASE_TABLET | Freq: Three times a day (TID) | ORAL | Status: DC | PRN

## 2014-06-20 MED ORDER — DICYCLOMINE HCL 20 MG PO TABS: 20.0000 mg | ORAL_TABLET | Freq: Three times a day (TID) | ORAL | Status: DC

## 2014-06-20 MED ORDER — POLYETHYLENE GLYCOL 3350 17 GM/SCOOP PO POWD: 17.0000 g | Freq: Every day | ORAL | Status: DC

## 2014-06-20 MED ORDER — DOCUSATE SODIUM 100 MG PO CAPS: 100.0000 mg | ORAL_CAPSULE | Freq: Every day | ORAL | Status: DC | PRN

## 2014-06-20 NOTE — Progress Notes (Signed)
Pt is here to establish care. Pt has a history of anemia,HTN and hyperlipidemia. Pt is c/o chronic pain in her right side of her abdomen. Pt has an interpreter.

## 2014-06-20 NOTE — Patient Instructions (Addendum)
Jacqueline Orozco,  Thank you for coming in today. It was a pleasure meeting you. I look forward to being your primary doctor.   1. IBS with constipation: Goal is one soft bowel movement a day Take miralax and colace as needed to achieve goal 2 week trial of bentyl 4 times daily with plan to continue only if symptoms are noticeably better. GI referral placed  2. HTN:  Labs HCTZ 12.5 mg once daily.   3. Due for screening mammogram: please schedule. Your orange card will be accepted.   F/u in 2 weeks for hypertension and IBS  Dr. Adrian Blackwater

## 2014-06-20 NOTE — Assessment & Plan Note (Signed)
A: chronic intermittent L sided neck and shoulder pain P: Tylenol 650 mg TID Avoid NSAID given hx of gastric AVM

## 2014-06-20 NOTE — Progress Notes (Signed)
   Subjective:    Patient ID: Jacqueline Orozco, female    DOB: 1951/06/07, 64 y.o.   MRN: [id number] CC: establish care, interpreter needed, HTN, HLD, anemia, R sided abdominal pain chronic  HPI 64 yo [place] female:  1. HTN: no treatment. Intermittent L sided chest pain at rest radiate from neck. No SOB, HA, syncope, presyncope or sweating.  2. IBS: with constipation. Not on bentyl. Reports have inconsistent improvement with bentyl. Taking miralax and prilosec. Last BM today but it was hard. No diarrhea, emesis, nausea, blood in stool. Previous GI doctor was Dr. Hilarie Fredrickson.   Soc Hx: chronic non smoker Med Hx: HTN dx in 2011  Surg Hx: negative  Review of Systems As per HPI     Objective:   Physical Exam BP 158/93 mmHg  Pulse 60  Temp(Src) 98.2 F (36.8 C) (Oral)  Resp 16  Ht 5' 2.5" (1.588 m)  Wt 161 lb (73.029 kg)  BMI 28.96 kg/m2  SpO2 95%  BP Readings from Last 3 Encounters:  06/20/14 158/93  09/29/13 140/86  09/23/13 130/90  General appearance: alert, cooperative and no distress Neck: supple, symmetrical, trachea midline Lungs: clear to auscultation bilaterally Heart: regular rate and rhythm, S1, S2 normal, no murmur, click, rub or gallop Abdomen: soft, non-tender; bowel sounds normal; no masses,  no organomegaly Extremities: extremities normal, atraumatic, no cyanosis or edema       Assessment & Plan:

## 2014-06-20 NOTE — Assessment & Plan Note (Signed)
  1. IBS with constipation: Goal is one soft bowel movement a day Take miralax and colace as needed to achieve goal 2 week trial of bentyl 4 times daily with plan to continue only if symptoms are noticeably better. GI referral placed

## 2014-06-20 NOTE — Assessment & Plan Note (Addendum)
2. HTN: BP not at goal  Labs- CMP, CBC, TSH  HCTZ 12.5 mg once daily.

## 2014-06-21 ENCOUNTER — Telehealth: Payer: Self-pay | Admitting: *Deleted

## 2014-06-21 ENCOUNTER — Encounter: Payer: Self-pay | Admitting: Internal Medicine

## 2014-06-21 ENCOUNTER — Other Ambulatory Visit: Payer: Self-pay

## 2014-06-21 DIAGNOSIS — Z1231 Encounter for screening mammogram for malignant neoplasm of breast: Secondary | ICD-10-CM

## 2014-06-21 MED ORDER — ATORVASTATIN CALCIUM 20 MG PO TABS: 20.0000 mg | ORAL_TABLET | Freq: Every day | ORAL | Status: DC

## 2014-06-21 NOTE — Telephone Encounter (Signed)
Pt is aware of her lab results. Pt will pick up new RX at our pharmacy.

## 2014-06-21 NOTE — Telephone Encounter (Signed)
-----   Message from Minerva Ends, MD sent at 06/21/2014  9:39 AM EST ----- Labs normal except for high cholesterol. Plan to change statin from simvastatin 20 to atorvastatin 20 with goal of LDL reduction.

## 2014-06-21 NOTE — Assessment & Plan Note (Signed)
A: LDL on the rise and patient reportedly compliant with simvastatin 20 mg daily P: change to lipitor 20 mg daily, dLDL in 6 months.

## 2014-06-21 NOTE — Addendum Note (Signed)
Addended by: Boykin Nearing on: 06/21/2014 09:41 AM   Modules accepted: Orders, Medications

## 2014-06-23 ENCOUNTER — Ambulatory Visit
Admission: RE | Admit: 2014-06-23 | Discharge: 2014-06-23 | Disposition: A | Discharge status: Home / Self Care | Payer: No Typology Code available for payment source | Source: Ambulatory Visit

## 2014-06-23 DIAGNOSIS — Z1231 Encounter for screening mammogram for malignant neoplasm of breast: Secondary | ICD-10-CM

## 2014-06-24 ENCOUNTER — Telehealth: Payer: Self-pay | Admitting: *Deleted

## 2014-06-24 NOTE — Telephone Encounter (Signed)
Used [employer] [place] # [id number] Pt aware of mammogram result

## 2014-06-24 NOTE — Telephone Encounter (Signed)
-----   Message from Minerva Ends, MD sent at 06/23/2014  4:53 PM EST ----- Normal mammogram, repeat in one year

## 2014-06-30 ENCOUNTER — Encounter: Payer: Self-pay | Admitting: Family Medicine

## 2014-06-30 ENCOUNTER — Ambulatory Visit (HOSPITAL_COMMUNITY)
Admission: RE | Admit: 2014-06-30 | Discharge: 2014-06-30 | Disposition: A | Discharge status: Home / Self Care | Payer: Self-pay | Source: Ambulatory Visit | Attending: Cardiology | Admitting: Cardiology

## 2014-06-30 ENCOUNTER — Other Ambulatory Visit: Payer: Self-pay

## 2014-06-30 ENCOUNTER — Ambulatory Visit: Payer: Self-pay | Attending: Family Medicine | Admitting: Family Medicine

## 2014-06-30 VITALS — BP 176/94 | HR 57 | Temp 97.4°F | Resp 16 | Ht 62.0 in | Wt 166.0 lb

## 2014-06-30 DIAGNOSIS — K589 Irritable bowel syndrome without diarrhea: Secondary | ICD-10-CM | POA: Insufficient documentation

## 2014-06-30 DIAGNOSIS — Z8619 Personal history of other infectious and parasitic diseases: Secondary | ICD-10-CM

## 2014-06-30 DIAGNOSIS — R079 Chest pain, unspecified: Secondary | ICD-10-CM | POA: Insufficient documentation

## 2014-06-30 DIAGNOSIS — I10 Essential (primary) hypertension: Secondary | ICD-10-CM | POA: Insufficient documentation

## 2014-06-30 MED ORDER — HYDROCHLOROTHIAZIDE 25 MG PO TABS: 25.0000 mg | ORAL_TABLET | Freq: Every day | ORAL | Status: DC

## 2014-06-30 MED ORDER — DICYCLOMINE HCL 20 MG PO TABS: 20.0000 mg | ORAL_TABLET | Freq: Three times a day (TID) | ORAL | Status: DC

## 2014-06-30 MED ORDER — AMLODIPINE BESYLATE 5 MG PO TABS: 10.0000 mg | ORAL_TABLET | Freq: Every day | ORAL | Status: DC

## 2014-06-30 NOTE — Assessment & Plan Note (Signed)
A: HTN:  With elevated BP and chest pain P: Increase HCTZ to 25 mg daily  Add norvasc 5 mg daily

## 2014-06-30 NOTE — Progress Notes (Signed)
   Subjective:    Patient ID: Ardyth Man, female    DOB: 09/02/1950, 64 y.o.   MRN: [id number] CC: f/u IBS, chest pains, HTN HPI 64 yo F f/u: interpreter present   1. IBS: improved. Using bentyl two times daily. Would like to continue. Concerned about dizziness if she increases the dose.   2. Chest pain: L sided x one week. Sharp. Non radiating.   3. HTN: taking HCTZ 12.5 mg daily. Has CP as above. Mild HA. No SOB or edema.   Review of Systems As per HPI      Objective:   Physical Exam  BP 176/94 mmHg  Pulse 57  Temp(Src) 97.4 F (36.3 C)  Resp 16  Ht 5\' 2"  (1.575 m)  Wt 166 lb (75.297 kg)  BMI 30.35 kg/m2  SpO2 95%  BP Readings from Last 3 Encounters:  06/30/14 176/94  06/20/14 158/93  09/29/13 140/86  General appearance: alert, cooperative and no distress Lungs: clear to auscultation bilaterally Heart: regular rate and rhythm, S1, S2 normal, no murmur, click, rub or gallop   EKG: normal EKG, normal sinus rhythm.        Assessment & Plan:

## 2014-06-30 NOTE — Progress Notes (Signed)
History obtained using language line interpreter # [phone] Patient complains of generalized severe pain 10/10 for the last month Chest pain currently-she describes as sharp not crushing Patient states she has been having chest pain on and off for the last week Flu and PNA up to date

## 2014-06-30 NOTE — Assessment & Plan Note (Addendum)
A: IBS: improved P: refilled bentyl take 2-3 times daily

## 2014-06-30 NOTE — Patient Instructions (Addendum)
Otani,  1. HTN:  Increase HCTZ to 25 mg daily  Add norvasc 5 mg daily   2. IBS: refilled bentyl take 2-3 times daily.   F/u in 2 weeks with nurse for BP check  F/u with me in 4 weeks for HTN and physical   Dr. Adrian Blackwater

## 2014-08-01 ENCOUNTER — Encounter: Payer: Self-pay | Admitting: *Deleted

## 2014-08-02 ENCOUNTER — Ambulatory Visit: Payer: Self-pay | Attending: Family Medicine | Admitting: *Deleted

## 2014-08-02 VITALS — BP 136/80 | HR 63 | Temp 98.3°F | Resp 16

## 2014-08-02 DIAGNOSIS — I10 Essential (primary) hypertension: Secondary | ICD-10-CM | POA: Insufficient documentation

## 2014-08-02 NOTE — Patient Instructions (Signed)
DASH Eating Plan °DASH stands for "Dietary Approaches to Stop Hypertension." The DASH eating plan is a healthy eating plan that has been shown to reduce high blood pressure (hypertension). Additional health benefits may include reducing the risk of type 2 diabetes mellitus, heart disease, and stroke. The DASH eating plan may also help with weight loss. °WHAT DO I NEED TO KNOW ABOUT THE DASH EATING PLAN? °For the DASH eating plan, you will follow these general guidelines: °· Choose foods with a percent daily value for sodium of less than 5% (as listed on the food label). °· Use salt-free seasonings or herbs instead of table salt or sea salt. °· Check with your health care provider or pharmacist before using salt substitutes. °· Eat lower-sodium products, often labeled as "lower sodium" or "no salt added." °· Eat fresh foods. °· Eat more vegetables, fruits, and low-fat dairy products. °· Choose whole grains. Look for the word "whole" as the first word in the ingredient list. °· Choose fish and skinless chicken or turkey more often than red meat. Limit fish, poultry, and meat to 6 oz (170 g) each day. °· Limit sweets, desserts, sugars, and sugary drinks. °· Choose heart-healthy fats. °· Limit cheese to 1 oz (28 g) per day. °· Eat more home-cooked food and less restaurant, buffet, and fast food. °· Limit fried foods. °· Cook foods using methods other than frying. °· Limit canned vegetables. If you do use them, rinse them well to decrease the sodium. °· When eating at a restaurant, ask that your food be prepared with less salt, or no salt if possible. °WHAT FOODS CAN I EAT? °Seek help from a dietitian for individual calorie needs. °Grains °Whole grain or whole wheat bread. Brown rice. Whole grain or whole wheat pasta. Quinoa, bulgur, and whole grain cereals. Low-sodium cereals. Corn or whole wheat flour tortillas. Whole grain cornbread. Whole grain crackers. Low-sodium crackers. °Vegetables °Fresh or frozen vegetables  (raw, steamed, roasted, or grilled). Low-sodium or reduced-sodium tomato and vegetable juices. Low-sodium or reduced-sodium tomato sauce and paste. Low-sodium or reduced-sodium canned vegetables.  °Fruits °All fresh, canned (in natural juice), or frozen fruits. °Meat and Other Protein Products °Ground beef (85% or leaner), grass-fed beef, or beef trimmed of fat. Skinless chicken or turkey. Ground chicken or turkey. Pork trimmed of fat. All fish and seafood. Eggs. Dried beans, peas, or lentils. Unsalted nuts and seeds. Unsalted canned beans. °Dairy °Low-fat dairy products, such as skim or 1% milk, 2% or reduced-fat cheeses, low-fat ricotta or cottage cheese, or plain low-fat yogurt. Low-sodium or reduced-sodium cheeses. °Fats and Oils °Tub margarines without trans fats. Light or reduced-fat mayonnaise and salad dressings (reduced sodium). Avocado. Safflower, olive, or canola oils. Natural peanut or almond butter. °Other °Unsalted popcorn and pretzels. °The items listed above may not be a complete list of recommended foods or beverages. Contact your dietitian for more options. °WHAT FOODS ARE NOT RECOMMENDED? °Grains °White bread. White pasta. White rice. Refined cornbread. Bagels and croissants. Crackers that contain trans fat. °Vegetables °Creamed or fried vegetables. Vegetables in a cheese sauce. Regular canned vegetables. Regular canned tomato sauce and paste. Regular tomato and vegetable juices. °Fruits °Dried fruits. Canned fruit in light or heavy syrup. Fruit juice. °Meat and Other Protein Products °Fatty cuts of meat. Ribs, chicken wings, bacon, sausage, bologna, salami, chitterlings, fatback, hot dogs, bratwurst, and packaged luncheon meats. Salted nuts and seeds. Canned beans with salt. °Dairy °Whole or 2% milk, cream, half-and-half, and cream cheese. Whole-fat or sweetened yogurt. Full-fat   cheeses or blue cheese. Nondairy creamers and whipped toppings. Processed cheese, cheese spreads, or cheese  curds. °Condiments °Onion and garlic salt, seasoned salt, table salt, and sea salt. Canned and packaged gravies. Worcestershire sauce. Tartar sauce. Barbecue sauce. Teriyaki sauce. Soy sauce, including reduced sodium. Steak sauce. Fish sauce. Oyster sauce. Cocktail sauce. Horseradish. Ketchup and mustard. Meat flavorings and tenderizers. Bouillon cubes. Hot sauce. Tabasco sauce. Marinades. Taco seasonings. Relishes. °Fats and Oils °Butter, stick margarine, lard, shortening, ghee, and bacon fat. Coconut, palm kernel, or palm oils. Regular salad dressings. °Other °Pickles and olives. Salted popcorn and pretzels. °The items listed above may not be a complete list of foods and beverages to avoid. Contact your dietitian for more information. °WHERE CAN I FIND MORE INFORMATION? °National Heart, [employer], and [employer] Institute: www.nhlbi.nih.gov/health/health-topics/topics/dash/ °Document Released: 05/16/2011 Document Revised: 10/11/2013 Document Reviewed: 03/31/2013 °Exit[employer]® Patient Information ©2015 [employer]. This information is not intended to replace advice given to you by your health care provider. Make sure you discuss any questions you have with your health care provider. ° °

## 2014-08-02 NOTE — Progress Notes (Signed)
Spoke with patient via [place] Patient presents for BP check Med list reviewed; states taking all meds as directed Discussed need for low sodium diet and using Mrs. Dash as alternative to salt Discussed walking 30 minutes per day for exercise Patient denies blurred vision, SHOB States she has ocassional pain left upper chest with left arm weakness Patient takes care of 62 yo granddaughter and carries her on left side States she's been worked up for chest pain and told it was musculoskeletal C/o headache 2 times per week relieved with Aleve within 15-20 minutes Discussed trying acetaminophen instead of aleve due to BP  BP 136/80 P 63 R 16   T  98.3 oral SPO2  97%  Patient advised to call for med refills at least 7 days before running out so as not to go without. Patient aware that she is to f/u with PCP 3 months from last visit (Due 09/29/14)

## 2014-08-11 ENCOUNTER — Ambulatory Visit: Payer: No Typology Code available for payment source | Admitting: Internal Medicine

## 2014-08-26 ENCOUNTER — Ambulatory Visit (INDEPENDENT_AMBULATORY_CARE_PROVIDER_SITE_OTHER): Payer: Self-pay | Admitting: Internal Medicine

## 2014-08-26 VITALS — BP 134/72 | HR 78 | Temp 98.1°F | Resp 18 | Ht 62.0 in | Wt 163.0 lb

## 2014-08-26 DIAGNOSIS — L237 Allergic contact dermatitis due to plants, except food: Secondary | ICD-10-CM

## 2014-08-26 MED ORDER — PREDNISONE 10 MG PO TABS: ORAL_TABLET | ORAL | Status: DC

## 2014-08-26 MED ORDER — CLOBETASOL PROPIONATE 0.05 % EX CREA: 1.0000 "application " | TOPICAL_CREAM | Freq: Two times a day (BID) | CUTANEOUS | Status: DC

## 2014-08-26 MED ORDER — METHYLPREDNISOLONE ACETATE 80 MG/ML IJ SUSP
80.0000 mg | Freq: Once | INTRAMUSCULAR | Status: AC
  Administered 2014-08-26: 80 mg via INTRAMUSCULAR

## 2014-08-26 NOTE — Progress Notes (Signed)
   Subjective:    Patient ID: Jacqueline Orozco, female    DOB: May 08, 1951, 64 y.o.   MRN: [id number] I, Shary Key, scribed for Dr Elder Cyphers.   HPI 64 year old female presents to [facility] with rash Rash began last Thursday 08/18/14. Beginning on neck and chest, spread to arms, hands and head beginning of this week, itching around eyes now.  Tried benedryl cream, it helps a little   No SOB, no itching in throat  Patient worked in yard 08/18/14.  Mowed yard, pulled weeds, sprayed a insect repellent.  Wore gloves.  No change in detergent.  No change in body wash.  Edited by dr. Elder Cyphers and agree with above. Review of Systems     Objective:   Physical Exam  Constitutional: She is oriented to person, place, and time. She appears well-developed and well-nourished. No distress.  HENT:  Head: Normocephalic.  Right Ear: External ear normal.  Left Ear: External ear normal.  Mouth/Throat: Oropharynx is clear and moist.  Eyes: Conjunctivae are normal. Pupils are equal, round, and reactive to light.  Cardiovascular: Normal rate.   Pulmonary/Chest: Effort normal and breath sounds normal.  Musculoskeletal: Normal range of motion.  Neurological: She is alert and oriented to person, place, and time. She exhibits normal muscle tone. Coordination normal.  Skin: Rash noted.     Plaques and early bubbles, erythematous  Psychiatric: She has a normal mood and affect. Her behavior is normal. Judgment and thought content normal.  Vitals reviewed.         Assessment & Plan:  Poison Ivy Avoidance taught Depomedrol 80mg  IM Prednisone 12d taper/Clobetasol

## 2014-08-26 NOTE — Patient Instructions (Signed)

## 2014-08-28 ENCOUNTER — Ambulatory Visit (INDEPENDENT_AMBULATORY_CARE_PROVIDER_SITE_OTHER): Payer: Self-pay | Admitting: Family Medicine

## 2014-08-28 VITALS — BP 126/82 | HR 68 | Temp 98.0°F | Resp 17 | Ht 63.5 in | Wt 164.0 lb

## 2014-08-28 DIAGNOSIS — L237 Allergic contact dermatitis due to plants, except food: Secondary | ICD-10-CM

## 2014-08-28 MED ORDER — CLOBETASOL PROPIONATE 0.05 % EX CREA: 1.0000 "application " | TOPICAL_CREAM | Freq: Two times a day (BID) | CUTANEOUS | Status: DC

## 2014-08-28 MED ORDER — PREDNISONE 10 MG PO TABS: ORAL_TABLET | ORAL | Status: DC

## 2014-08-28 NOTE — Progress Notes (Signed)
Chief Complaint:  Chief Complaint  Patient presents with  . Rash    on arms     HPI: Jacqueline Orozco is a 64 y.o. female who is here for  recheck of her poison ivy rash. She has only been on Zyrtec and Benadryl. She was prescribed prednisone and also clobetasol cream to help with the poison ivy rash and itchiness but she did not realize that it was sent to the pharmacy. They do not speak English well and there was a miscommunication. The medicines were sent but again were never picked up.  Past Medical History  Diagnosis Date  . HEPATITIS B, CHRONIC 03/28/2010  . HYPERLIPIDEMIA 03/28/2010  . HYPERTENSION 03/28/2010  . Adenomatous colon polyp   . Anemia   . Lumbar disc disease 09/29/2013  . Cold sore   . Fatty liver   . Gastric AVM   . IBS (irritable bowel syndrome)   . GERD (gastroesophageal reflux disease)   . Internal hemorrhoids   . Allergy   . Blood transfusion without reported diagnosis    No past surgical history on file. History   Social History  . Marital Status: Married    Spouse Name: N/A  . Number of Children: 6  . Years of Education: 12    Occupational History  . Unemployed     Social History Main Topics  . Smoking status: Never Smoker   . Smokeless tobacco: Never Used  . Alcohol Use: No  . Drug Use: No  . Sexual Activity: Yes    Birth Control/ Protection: None   Other Topics Concern  . None   Social History Narrative   From [place].   Lived in [place] since 1994.    Live with husband.   6 adult children.    Speaks some English and reads some  [place].    Family History  Problem Relation Age of Onset  . Hypertension Mother   . Liver disease Maternal Uncle   . Lung cancer Maternal Grandmother   . Cancer Neg Hx    Allergies  Allergen Reactions  . Aspirin   . Penicillins   . Streptomycin   . Tramadol Nausea Only   Prior to Admission medications   Medication Sig Start Date End Date Taking? Authorizing Provider  acetaminophen (TYLENOL 8  HOUR) 650 MG CR tablet Take 1 tablet (650 mg total) by mouth every 8 (eight) hours as needed for pain. 06/20/14  Yes Josalyn Funches, MD  amLODipine (NORVASC) 5 MG tablet Take 2 tablets (10 mg total) by mouth daily. 06/30/14  Yes Josalyn Funches, MD  atorvastatin (LIPITOR) 20 MG tablet Take 1 tablet (20 mg total) by mouth daily. 06/21/14  Yes Josalyn Funches, MD  clobetasol cream (TEMOVATE) 8.36 % Apply 1 application topically 2 (two) times daily. 08/26/14  Yes Orma Flaming, MD  dicyclomine (BENTYL) 20 MG tablet Take 1 tablet (20 mg total) by mouth 3 (three) times daily before meals. 06/30/14  Yes Josalyn Funches, MD  docusate sodium (COLACE) 100 MG capsule Take 1 capsule (100 mg total) by mouth daily as needed for mild constipation. 06/20/14  Yes Josalyn Funches, MD  hydrochlorothiazide (HYDRODIURIL) 25 MG tablet Take 1 tablet (25 mg total) by mouth daily. 06/30/14  Yes Josalyn Funches, MD  omeprazole (PRILOSEC) 40 MG capsule Take 1 capsule (40 mg total) by mouth daily before supper. 30 minutes before supper 06/20/14  Yes Josalyn Funches, MD  polyethylene glycol powder (GLYCOLAX/MIRALAX) powder Take 17 g by  mouth daily. 06/20/14  Yes Josalyn Funches, MD  predniSONE (DELTASONE) 10 MG tablet 6-6-5-5-4-4-3-3-2-2-1-1 po pc for PI 08/26/14  Yes Orma Flaming, MD     ROS: The patient denies fevers, chills, night sweats, unintentional weight loss, chest pain, palpitations, wheezing, dyspnea on exertion, nausea, vomiting, abdominal pain, dysuria, hematuria, melena, numbness, weakness, or tingling.   All other systems have been reviewed and were otherwise negative with the exception of those mentioned in the HPI and as above.    PHYSICAL EXAM: Filed Vitals:   08/28/14 1245  BP: 126/82  Pulse: 68  Temp: 98 F (36.7 C)  Resp: 17   Filed Vitals:   08/28/14 1245  Height: 5' 3.5" (1.613 m)  Weight: 164 lb (74.39 kg)   Body mass index is 28.59 kg/(m^2).  General: Alert, no acute distress HEENT:   Normocephalic, atraumatic, oropharynx patent. EOMI, PERRLA Cardiovascular:  Regular rate and rhythm, no rubs murmurs or gallops.  No Carotid bruits, radial pulse intact. No pedal edema.  Respiratory: Clear to auscultation bilaterally.  No wheezes, rales, or rhonchi.  No cyanosis, no use of accessory musculature GI: No organomegaly, abdomen is soft and non-tender, positive bowel sounds.  No masses. Skin: Poison oak/ivy rash Neurologic: Facial musculature symmetric. Psychiatric: Patient is appropriate throughout our interaction. Lymphatic: No cervical lymphadenopathy Musculoskeletal: Gait intact.   LABS: Results for orders placed or performed in visit on 06/20/14  TSH  Result Value Ref Range   TSH 1.051 0.350 - 4.500 uIU/mL  COMPLETE METABOLIC PANEL WITH GFR  Result Value Ref Range   Sodium 140 135 - 145 mEq/L   Potassium 4.0 3.5 - 5.3 mEq/L   Chloride 102 96 - 112 mEq/L   CO2 28 19 - 32 mEq/L   Glucose, Bld 89 70 - 99 mg/dL   BUN 17 6 - 23 mg/dL   Creat 0.89 0.50 - 1.10 mg/dL   Total Bilirubin 0.6 0.2 - 1.2 mg/dL   Alkaline Phosphatase 67 39 - 117 U/L   AST 20 0 - 37 U/L   ALT 20 0 - 35 U/L   Total Protein 7.4 6.0 - 8.3 g/dL   Albumin 4.3 3.5 - 5.2 g/dL   Calcium 9.6 8.4 - 10.5 mg/dL   GFR, Est African American 81 mL/min   GFR, Est Non African American 70 mL/min  CBC  Result Value Ref Range   WBC 5.8 4.0 - 10.5 K/uL   RBC 5.17 (H) 3.87 - 5.11 MIL/uL   Hemoglobin 13.9 12.0 - 15.0 g/dL   HCT 41.9 36.0 - 46.0 %   MCV 81.0 78.0 - 100.0 fL   MCH 26.9 26.0 - 34.0 pg   MCHC 33.2 30.0 - 36.0 g/dL   RDW 14.2 11.5 - 15.5 %   Platelets 196 150 - 400 K/uL   MPV 9.9 8.6 - 12.4 fL  Lipid Panel  Result Value Ref Range   Cholesterol 214 (H) 0 - 200 mg/dL   Triglycerides 160 (H) <150 mg/dL   HDL 58 >39 mg/dL   Total CHOL/HDL Ratio 3.7 Ratio   VLDL 32 0 - 40 mg/dL   LDL Cholesterol 124 (H) 0 - 99 mg/dL  Lipase  Result Value Ref Range   Lipase 30 0 - 75 U/L  POCT urinalysis  dipstick  Result Value Ref Range   Color, UA yellow    Clarity, UA clear    Glucose, UA neg    Bilirubin, UA neg    Ketones, UA neg  Spec Grav, UA >=1.030    Blood, UA neg    pH, UA 5.5    Protein, UA neg    Urobilinogen, UA 0.2    Nitrite, UA neg    Leukocytes, UA Negative      EKG/XRAY:   Primary read interpreted by Dr. Marin Comment at [facility].   ASSESSMENT/PLAN: Encounter Diagnoses  Name Primary?  . Poison ivy dermatitis Yes  . Poison ivy    I re-sent in clobetasol and prednisone just in case the pharmacy did not have it. She was shown how to take the prednisone taper. She'll avoid all outside work. No office visit charge for this. This was a miscommunication. Follow-up as needed.   Gross sideeffects, risk and benefits, and alternatives of medications d/w patient. Patient is aware that all medications have potential sideeffects and we are unable to predict every sideeffect or drug-drug interaction that may occur.  Shondell Fabel, [place], DO 08/28/2014 1:23 PM

## 2014-09-15 ENCOUNTER — Encounter: Payer: Self-pay | Admitting: Family Medicine

## 2014-09-15 ENCOUNTER — Other Ambulatory Visit (HOSPITAL_COMMUNITY)
Admission: RE | Admit: 2014-09-15 | Discharge: 2014-09-15 | Disposition: A | Discharge status: Home / Self Care | Payer: Self-pay | Source: Ambulatory Visit | Attending: Family Medicine | Admitting: Family Medicine

## 2014-09-15 ENCOUNTER — Encounter: Payer: Self-pay | Admitting: Internal Medicine

## 2014-09-15 ENCOUNTER — Ambulatory Visit: Payer: Self-pay | Attending: Family Medicine | Admitting: Family Medicine

## 2014-09-15 VITALS — BP 146/84 | HR 71 | Temp 98.4°F | Resp 16 | Ht 63.5 in | Wt 171.0 lb

## 2014-09-15 DIAGNOSIS — Z1151 Encounter for screening for human papillomavirus (HPV): Secondary | ICD-10-CM | POA: Insufficient documentation

## 2014-09-15 DIAGNOSIS — J302 Other seasonal allergic rhinitis: Secondary | ICD-10-CM | POA: Insufficient documentation

## 2014-09-15 DIAGNOSIS — I10 Essential (primary) hypertension: Secondary | ICD-10-CM

## 2014-09-15 DIAGNOSIS — Z124 Encounter for screening for malignant neoplasm of cervix: Secondary | ICD-10-CM

## 2014-09-15 DIAGNOSIS — Z113 Encounter for screening for infections with a predominantly sexual mode of transmission: Secondary | ICD-10-CM | POA: Insufficient documentation

## 2014-09-15 DIAGNOSIS — Z114 Encounter for screening for human immunodeficiency virus [HIV]: Secondary | ICD-10-CM | POA: Insufficient documentation

## 2014-09-15 DIAGNOSIS — N760 Acute vaginitis: Secondary | ICD-10-CM | POA: Insufficient documentation

## 2014-09-15 DIAGNOSIS — Z01419 Encounter for gynecological examination (general) (routine) without abnormal findings: Secondary | ICD-10-CM | POA: Insufficient documentation

## 2014-09-15 MED ORDER — CETIRIZINE HCL 10 MG PO TABS: 10.0000 mg | ORAL_TABLET | Freq: Every day | ORAL | Status: DC

## 2014-09-15 MED ORDER — HYDROCHLOROTHIAZIDE 25 MG PO TABS: 25.0000 mg | ORAL_TABLET | Freq: Every day | ORAL | Status: DC

## 2014-09-15 MED ORDER — AMLODIPINE BESYLATE 5 MG PO TABS: 10.0000 mg | ORAL_TABLET | Freq: Every day | ORAL | Status: DC

## 2014-09-15 NOTE — Addendum Note (Signed)
Addended by: Boykin Nearing on: 09/15/2014 04:15 PM   Modules accepted: Orders

## 2014-09-15 NOTE — Assessment & Plan Note (Signed)
Screening HIV done today  

## 2014-09-15 NOTE — Progress Notes (Signed)
   Subjective:    Patient ID: Jacqueline Orozco, female    DOB: 06/08/1951, 64 y.o.   MRN: [id number] CC: wellness visit  HPI Here for wellness visit   Soc Hx: non smoker  Review of Systems  General:  Negative for unexplained weight loss, fever Skin: Negative for new or changing mole, sore that won't heal HEENT: Negative for trouble hearing, trouble seeing, ringing in ears, mouth sores, hoarseness, change in voice, dysphagia. CV:  Negative for chest pain, dyspnea, edema, palpitations Resp: Negative for cough, dyspnea, hemoptysis GI:  Lower abdominal pain cramping. Negative for nausea, vomiting, diarrhea, constipation, abdominal pain, melena, hematochezia. GU: Negative for dysuria, incontinence, urinary hesitance, hematuria, vaginal or penile discharge, polyuria, sexual difficulty, lumps in testicle or breasts MSK: Negative for muscle cramps or aches, joint pain or swelling Neuro: Negative for headaches, weakness, numbness, dizziness, passing out/fainting Psych: Negative for depression, anxiety, memory problems     Objective:   Physical Exam BP 146/84 mmHg  Pulse 71  Temp(Src) 98.4 F (36.9 C) (Oral)  Resp 16  Ht 5' 3.5" (1.613 m)  Wt 171 lb (77.565 kg)  BMI 29.81 kg/m2  SpO2 96%  General Appearance:    Alert, cooperative, no distress, appears stated age  Head:    Normocephalic, without obvious abnormality, atraumatic  Eyes:    PERRL, conjunctiva/corneas clear, EOM's intact, fundi    benign, both eyes  Ears:    Normal TM's and external ear canals, both ears  Nose:   Swollen nasal turbinates b/l   Throat:   Lips, mucosa, and tongue normal; teeth and gums normal  Neck:   Supple, symmetrical, trachea midline, no adenopathy;    thyroid:  no enlargement/tenderness/nodules; no carotid   bruit or JVD  Back:     Symmetric, no curvature, ROM normal, no CVA tenderness  Lungs:     Clear to auscultation bilaterally, respirations unlabored  Chest Wall:    No tenderness or deformity   Heart:     Regular rate and rhythm, S1 and S2 normal, no murmur, rub   or gallop  Breast Exam:    No tenderness, masses, or nipple abnormality  Abdomen:     Soft, non-tender, bowel sounds active all four quadrants,    no masses, no organomegaly  Genitalia:    Normal female without lesion, discharge or tenderness, normal cervix with polyp at 1 o'clock   Rectal:    Normal external   Extremities:   Extremities normal, atraumatic, no cyanosis or edema  Pulses:   2+ and symmetric all extremities  Skin:   Skin color, texture, turgor normal, no rashes or lesions  Lymph nodes:   Cervical, supraclavicular, and axillary nodes normal  Neurologic:   CNII-XII intact, normal strength, sensation and reflexes    throughout      Assessment & Plan:

## 2014-09-15 NOTE — Patient Instructions (Addendum)
Jacqueline Orozco,   1. Pap done today.   2.  Screening HIV done today.  3. Seasonal allergies: please start zyrtec once daily during the spring time  F/u in 6 months  Dr. Adrian Blackwater

## 2014-09-15 NOTE — Assessment & Plan Note (Signed)
Seasonal allergies: please start zyrtec once daily during the spring time

## 2014-09-15 NOTE — Assessment & Plan Note (Signed)
Pap done today  

## 2014-09-15 NOTE — Progress Notes (Signed)
Annual pap and Physical  Complaining of ankle swelling    Used [employer]

## 2014-09-16 LAB — CERVICOVAGINAL ANCILLARY ONLY
Chlamydia: NEGATIVE
Neisseria Gonorrhea: NEGATIVE

## 2014-09-16 LAB — HIV ANTIBODY (ROUTINE TESTING W REFLEX): HIV: NONREACTIVE

## 2014-09-19 ENCOUNTER — Other Ambulatory Visit: Payer: Self-pay | Admitting: Family Medicine

## 2014-09-19 LAB — CYTOLOGY - PAP

## 2014-09-19 LAB — CERVICOVAGINAL ANCILLARY ONLY: Wet Prep (BD Affirm): NEGATIVE

## 2014-09-22 ENCOUNTER — Telehealth: Payer: Self-pay | Admitting: *Deleted

## 2014-09-22 NOTE — Telephone Encounter (Signed)
Used [employer] [place] # [id number] Pt aware of results

## 2014-09-22 NOTE — Telephone Encounter (Signed)
-----   Message from Boykin Nearing, MD sent at 09/19/2014  9:14 AM EDT ----- Neg wet prep

## 2014-09-22 NOTE — Telephone Encounter (Signed)
-----   Message from Boykin Nearing, MD sent at 09/19/2014  2:57 PM EDT ----- Normal pap,  Repeat in 4 years. Last pap.

## 2014-09-22 NOTE — Telephone Encounter (Signed)
-----   Message from Boykin Nearing, MD sent at 09/16/2014  9:02 AM EDT ----- Screening HIV neg Gc/chlam neg

## 2014-10-13 ENCOUNTER — Encounter: Payer: Self-pay | Admitting: Family Medicine

## 2014-10-13 ENCOUNTER — Ambulatory Visit: Payer: Self-pay | Attending: Family Medicine | Admitting: Family Medicine

## 2014-10-13 VITALS — BP 124/77 | HR 84 | Temp 98.7°F | Resp 18 | Ht 63.0 in | Wt 160.0 lb

## 2014-10-13 DIAGNOSIS — J069 Acute upper respiratory infection, unspecified: Secondary | ICD-10-CM

## 2014-10-13 DIAGNOSIS — B9789 Other viral agents as the cause of diseases classified elsewhere: Secondary | ICD-10-CM

## 2014-10-13 MED ORDER — DOXYCYCLINE HYCLATE 100 MG PO TABS: 100.0000 mg | ORAL_TABLET | Freq: Two times a day (BID) | ORAL | Status: DC

## 2014-10-13 NOTE — Progress Notes (Signed)
Complaining of dry cough x1 month Back and lt  rib area pain from coughing  No Hx Tobacco

## 2014-10-13 NOTE — Patient Instructions (Signed)
Jacqueline Orozco,  Thank you for coming in today.  Take doxycycline 1 pill twice daily Continue robitussin for cough Tylenol for pain   No cough medicine Rx due to itching with tramadol  F/u in 2 weeks if no better with antibiotic  Dr. Adrian Blackwater

## 2014-10-13 NOTE — Progress Notes (Signed)
   Subjective:    Patient ID: Jacqueline Orozco, female    DOB: 1950-11-04, 64 y.o.   MRN: [id number] CC: cough with chest pain  HPI 64 yo F:  Cough: x 4 weeks. Mostly non-productive. No known sick contacts. No help with mucinex. Has L sided CP with cough, dry mouth and sore throat.   Soc Hx: non smoker  Review of Systems  Constitutional: Positive for chills. Negative for fever.  Respiratory: Positive for cough. Negative for chest tightness, shortness of breath, wheezing and stridor.   Cardiovascular: Positive for chest pain.       Objective:   Physical Exam BP 124/77 mmHg  Pulse 84  Temp(Src) 98.7 F (37.1 C) (Oral)  Resp 18  Ht 5\' 3"  (1.6 m)  Wt 160 lb (72.576 kg)  BMI 28.35 kg/m2  SpO2 97% General appearance: alert, cooperative and no distress Nose: no discharge, turbinates pink, swollen Throat: lips, mucosa, and tongue normal; teeth and gums normal Lungs: clear to auscultation bilaterally Heart: regular rate and rhythm, S1, S2 normal, no murmur, click, rub or gallop     Assessment & Plan:

## 2014-10-13 NOTE — Assessment & Plan Note (Signed)
A; viral URI with cough P: Take doxycycline 1 pill twice daily Continue robitussin for cough Tylenol for pain   No cough medicine Rx due to itching with tramadol

## 2014-11-09 ENCOUNTER — Ambulatory Visit: Payer: Self-pay | Attending: Family Medicine

## 2014-11-14 ENCOUNTER — Other Ambulatory Visit (INDEPENDENT_AMBULATORY_CARE_PROVIDER_SITE_OTHER): Payer: Self-pay

## 2014-11-14 ENCOUNTER — Encounter: Payer: Self-pay | Admitting: Internal Medicine

## 2014-11-14 ENCOUNTER — Ambulatory Visit (INDEPENDENT_AMBULATORY_CARE_PROVIDER_SITE_OTHER): Payer: Self-pay | Admitting: Internal Medicine

## 2014-11-14 VITALS — BP 118/64 | HR 71 | Ht 61.25 in | Wt 160.0 lb

## 2014-11-14 DIAGNOSIS — R109 Unspecified abdominal pain: Secondary | ICD-10-CM

## 2014-11-14 DIAGNOSIS — K921 Melena: Secondary | ICD-10-CM

## 2014-11-14 DIAGNOSIS — K589 Irritable bowel syndrome without diarrhea: Secondary | ICD-10-CM

## 2014-11-14 DIAGNOSIS — K31819 Angiodysplasia of stomach and duodenum without bleeding: Secondary | ICD-10-CM

## 2014-11-14 DIAGNOSIS — Z860100 Personal history of colon polyps, unspecified: Secondary | ICD-10-CM

## 2014-11-14 LAB — CBC WITH DIFFERENTIAL/PLATELET
BASOS ABS: 0 10*3/uL (ref 0.0–0.1)
Basophils Relative: 0.5 % (ref 0.0–3.0)
EOS ABS: 0.2 10*3/uL (ref 0.0–0.7)
Eosinophils Relative: 3.2 % (ref 0.0–5.0)
HCT: 41.7 % (ref 36.0–46.0)
Hemoglobin: 13.8 g/dL (ref 12.0–15.0)
Lymphocytes Relative: 35.7 % (ref 12.0–46.0)
Lymphs Abs: 2.2 10*3/uL (ref 0.7–4.0)
MCHC: 33 g/dL (ref 30.0–36.0)
MCV: 81.8 fl (ref 78.0–100.0)
Monocytes Absolute: 0.4 10*3/uL (ref 0.1–1.0)
Monocytes Relative: 6.8 % (ref 3.0–12.0)
NEUTROS ABS: 3.3 10*3/uL (ref 1.4–7.7)
Neutrophils Relative %: 53.8 % (ref 43.0–77.0)
PLATELETS: 188 10*3/uL (ref 150.0–400.0)
RBC: 5.1 Mil/uL (ref 3.87–5.11)
RDW: 13.9 % (ref 11.5–15.5)
WBC: 6.2 10*3/uL (ref 4.0–10.5)

## 2014-11-14 LAB — IBC PANEL
IRON: 59 ug/dL (ref 42–145)
Saturation Ratios: 18.2 % — ABNORMAL LOW (ref 20.0–50.0)
TRANSFERRIN: 232 mg/dL (ref 212.0–360.0)

## 2014-11-14 LAB — FERRITIN: Ferritin: 160 ng/mL (ref 10.0–291.0)

## 2014-11-14 MED ORDER — POLYETHYLENE GLYCOL 3350 17 GM/SCOOP PO POWD: 17.0000 g | Freq: Every day | ORAL | Status: DC

## 2014-11-14 MED ORDER — DICYCLOMINE HCL 20 MG PO TABS: 20.0000 mg | ORAL_TABLET | Freq: Three times a day (TID) | ORAL | Status: DC

## 2014-11-14 MED ORDER — NA SULFATE-K SULFATE-MG SULF 17.5-3.13-1.6 GM/177ML PO SOLN: ORAL | Status: DC

## 2014-11-14 NOTE — Progress Notes (Signed)
   Subjective:    Patient ID: Jacqueline Orozco, female    DOB: 22-Nov-1950, 64 y.o.   MRN: [id number]  HPI Jacqueline Orozco is a 64 yo female with PMH of gastric angiodysplasia, GERD, adenomatous colon polyp, and IBS constipation who is seen in follow-up. She is here today with a medical interpreter. Overall she is feeling well. She is concerned she is intermittently seeing black stools. This occurs less than one day per week but has occurred several times over the last few months. She is taking omeprazole 40 mg daily. She has constipation if she does not use MiraLAX. She is using MiraLAX 17 g daily. This is working well. She still continues to have intermittent lower abdominal and left-sided abdominal cramping which is improved by Bentyl. She is using Bentyl 20 mg 3 times daily. She denies heartburn, dysphagia or odynophagia. Denies early satiety. Overall appetite is good. She reports a stable weight.  No rectal bleeding or bright red blood per rectum.   Review of Systems As per HPI, otherwise negative  Current Medications, Allergies, Past Medical History, Past Surgical History, Family History and Social History were reviewed in [employer] record.     Objective:   Physical Exam BP 118/64 mmHg  Pulse 71  Ht 5' 1.25" (1.556 m)  Wt 160 lb (72.576 kg)  BMI 29.98 kg/m2 Constitutional: Well-developed and well-nourished. No distress. HEENT: Normocephalic and atraumatic. Oropharynx is clear and moist. No oropharyngeal exudate. Conjunctivae are normal.  No scleral icterus. Neck: Neck supple. Trachea midline. Cardiovascular: Normal rate, regular rhythm and intact distal pulses.  Pulmonary/chest: Effort normal and breath sounds normal. No wheezing, rales or rhonchi. Abdominal: Soft, nontender, nondistended. Bowel sounds active throughout. Extremities: no clubbing, cyanosis, or edema Lymphadenopathy: No cervical adenopathy noted. Neurological: Alert and oriented to person place and  time. Skin: Skin is warm and dry. No rashes noted. Psychiatric: Normal mood and affect. Behavior is normal.  Pathology from previous colonoscopies received from [facility], GI  Path 08/29/2000 - tubular adenoma, no HGD  Path 07/04/2009 - hyperplastic polyp  EGD -- 10/30/2012 -- normal esophagus. Irregular Z line. Angiodysplasia in the gastric fundus with one Hemoclip applied. Otherwise normal stomach, biopsies obtained. Normal duodenum. Gastric biopsies benign gastric mucosa with minimal chronic inflammation. No H. pylori    Assessment & Plan:   64 yo female with PMH of gastric angiodysplasia, GERD, adenomatous colon polyp, and IBS constipation who is seen in follow-up.  1. ? Melena/history of gastric angiodysplasia -- CBC and iron studies today. Repeat upper endoscopy recommended with APC ablation if gastric angiectasia still present. The procedure recommended to be done in the hospital setting as an outpatient due to the availability of APC. Procedure discussed including risks and benefits and she agrees to proceed. She will continue omeprazole 40 mg daily. This is prescribed by her primary care provider.  2. History of colon polyps -- last colonoscopy 5 years ago, repeat recommended at this time. Procedure discussed including the risks and benefits and she is agreeable to proceed  3. IBS with constipation -- MiraLAX working well. Continue 17 g 3 times daily. We discussed changing Bentyl to as needed but she prefers to take this on a scheduled basis because she notices more cramping if she misses doses. Continue dicyclomine 20 mg 3 times daily

## 2014-11-14 NOTE — Patient Instructions (Addendum)
You have been scheduled for an endoscopy and colonoscopy. Please follow the written instructions given to you at your visit today. Please pick up your prep supplies at the pharmacy within the next 1-3 days. If you use inhalers (even only as needed), please bring them with you on the day of your procedure.   Your physician has requested that you go to the basement for the following lab work before leaving today: Cbc/diff, iron studies  Stay on your omeprazole.    We have sent the following medications to your pharmacy for you to pick up at your convenience: Suprep, dicyclomine, miralax   I appreciate the opportunity to care for you.

## 2014-11-18 ENCOUNTER — Ambulatory Visit: Payer: Self-pay | Admitting: Family Medicine

## 2014-12-27 ENCOUNTER — Encounter (HOSPITAL_COMMUNITY): Payer: Self-pay | Admitting: *Deleted

## 2014-12-28 ENCOUNTER — Telehealth: Payer: Self-pay | Admitting: Internal Medicine

## 2014-12-28 MED ORDER — NA SULFATE-K SULFATE-MG SULF 17.5-3.13-1.6 GM/177ML PO SOLN: ORAL | Status: DC

## 2014-12-28 NOTE — Telephone Encounter (Signed)
Prescription for Suprep resent to the pharmacy.

## 2014-12-30 ENCOUNTER — Telehealth: Payer: Self-pay

## 2014-12-30 NOTE — Telephone Encounter (Signed)
Received call from [facility] did not have any suprep needed for patient's colonosocopy.  Patient is uninsured so rx cannot be sent anywhere else.  There are no samples of Suprep in the office so we need to change her prep to the split dose miralax.  Called patient and left message asking her to call but not sure if she or anyone around her can speak [place].   b

## 2014-12-30 NOTE — Telephone Encounter (Signed)
Had interpreter call patient and discovered that she had already picked up her prep at [facility].  Interpreter helped her clarify some of the instructions.  She is clear on the date, time, and location of the procedure

## 2015-01-03 ENCOUNTER — Ambulatory Visit (HOSPITAL_COMMUNITY)
Admission: RE | Admit: 2015-01-03 | Discharge: 2015-01-03 | Disposition: A | Discharge status: Home / Self Care | Payer: Self-pay | Source: Ambulatory Visit | Attending: Internal Medicine | Admitting: Internal Medicine

## 2015-01-03 ENCOUNTER — Encounter (HOSPITAL_COMMUNITY): Payer: Self-pay | Admitting: *Deleted

## 2015-01-03 ENCOUNTER — Ambulatory Visit (HOSPITAL_COMMUNITY): Payer: MEDICAID | Admitting: Anesthesiology

## 2015-01-03 ENCOUNTER — Ambulatory Visit (HOSPITAL_COMMUNITY): Payer: Self-pay | Admitting: Anesthesiology

## 2015-01-03 ENCOUNTER — Encounter (HOSPITAL_COMMUNITY)
Admission: RE | Disposition: A | Discharge status: Home / Self Care | Payer: Self-pay | Source: Ambulatory Visit | Attending: Internal Medicine

## 2015-01-03 ENCOUNTER — Other Ambulatory Visit: Payer: Self-pay | Admitting: Family Medicine

## 2015-01-03 DIAGNOSIS — Z79899 Other long term (current) drug therapy: Secondary | ICD-10-CM | POA: Insufficient documentation

## 2015-01-03 DIAGNOSIS — Z8719 Personal history of other diseases of the digestive system: Secondary | ICD-10-CM | POA: Insufficient documentation

## 2015-01-03 DIAGNOSIS — Z800 Family history of malignant neoplasm of digestive organs: Secondary | ICD-10-CM | POA: Insufficient documentation

## 2015-01-03 DIAGNOSIS — Z860100 Personal history of colon polyps, unspecified: Secondary | ICD-10-CM

## 2015-01-03 DIAGNOSIS — K219 Gastro-esophageal reflux disease without esophagitis: Secondary | ICD-10-CM | POA: Insufficient documentation

## 2015-01-03 DIAGNOSIS — I10 Essential (primary) hypertension: Secondary | ICD-10-CM | POA: Insufficient documentation

## 2015-01-03 DIAGNOSIS — K31819 Angiodysplasia of stomach and duodenum without bleeding: Secondary | ICD-10-CM

## 2015-01-03 DIAGNOSIS — K921 Melena: Secondary | ICD-10-CM

## 2015-01-03 DIAGNOSIS — K589 Irritable bowel syndrome without diarrhea: Secondary | ICD-10-CM

## 2015-01-03 HISTORY — DX: Other specified postprocedural states: R11.2

## 2015-01-03 HISTORY — PX: HOT HEMOSTASIS: SHX5433

## 2015-01-03 HISTORY — DX: Other specified postprocedural states: Z98.890

## 2015-01-03 HISTORY — PX: ESOPHAGOGASTRODUODENOSCOPY (EGD) WITH PROPOFOL: SHX5813

## 2015-01-03 HISTORY — PX: COLONOSCOPY WITH PROPOFOL: SHX5780

## 2015-01-03 SURGERY — COLONOSCOPY WITH PROPOFOL
Anesthesia: Monitor Anesthesia Care

## 2015-01-03 MED ORDER — LIDOCAINE HCL (CARDIAC) 20 MG/ML IV SOLN
INTRAVENOUS | Status: DC | PRN
  Administered 2015-01-03: 100 mg via INTRAVENOUS

## 2015-01-03 MED ORDER — ONDANSETRON HCL 4 MG/2ML IJ SOLN
INTRAMUSCULAR | Status: DC | PRN
  Administered 2015-01-03: 4 mg via INTRAVENOUS

## 2015-01-03 MED ORDER — PROPOFOL 10 MG/ML IV BOLUS
INTRAVENOUS | Status: DC | PRN
  Administered 2015-01-03 (×2): 20 mg via INTRAVENOUS

## 2015-01-03 MED ORDER — FENTANYL CITRATE (PF) 100 MCG/2ML IJ SOLN
INTRAMUSCULAR | Status: DC | PRN
  Administered 2015-01-03 (×2): 25 ug via INTRAVENOUS

## 2015-01-03 MED ORDER — MEPERIDINE HCL 100 MG/ML IJ SOLN: 6.2500 mg | INTRAMUSCULAR | Status: DC | PRN

## 2015-01-03 MED ORDER — PROPOFOL INFUSION 10 MG/ML OPTIME
INTRAVENOUS | Status: DC | PRN
  Administered 2015-01-03: 140 ug/kg/min via INTRAVENOUS

## 2015-01-03 MED ORDER — PROMETHAZINE HCL 25 MG/ML IJ SOLN
6.2500 mg | INTRAMUSCULAR | Status: DC | PRN
Start: 2015-01-03 — End: 2015-01-03

## 2015-01-03 MED ORDER — PROPOFOL 10 MG/ML IV BOLUS
INTRAVENOUS | Status: AC
  Filled 2015-01-03: qty 20

## 2015-01-03 MED ORDER — FENTANYL CITRATE (PF) 100 MCG/2ML IJ SOLN: 25.0000 ug | INTRAMUSCULAR | Status: DC | PRN

## 2015-01-03 MED ORDER — LACTATED RINGERS IV SOLN
INTRAVENOUS | Status: DC | PRN
  Administered 2015-01-03: 09:00:00 via INTRAVENOUS

## 2015-01-03 MED ORDER — FENTANYL CITRATE (PF) 100 MCG/2ML IJ SOLN
INTRAMUSCULAR | Status: AC
  Filled 2015-01-03: qty 2

## 2015-01-03 MED ORDER — MIDAZOLAM HCL 5 MG/ML IJ SOLN: 0.5000 mg | Freq: Once | INTRAMUSCULAR | Status: DC | PRN

## 2015-01-03 MED ORDER — ONDANSETRON HCL 4 MG/2ML IJ SOLN
INTRAMUSCULAR | Status: AC
  Filled 2015-01-03: qty 2

## 2015-01-03 MED ORDER — LIDOCAINE HCL (CARDIAC) 20 MG/ML IV SOLN
INTRAVENOUS | Status: AC
  Filled 2015-01-03: qty 5

## 2015-01-03 MED ORDER — SODIUM CHLORIDE 0.9 % IV SOLN: INTRAVENOUS | Status: DC

## 2015-01-03 MED ORDER — BUTAMBEN-TETRACAINE-BENZOCAINE 2-2-14 % EX AERO
INHALATION_SPRAY | CUTANEOUS | Status: DC | PRN
  Administered 2015-01-03: 2 via TOPICAL

## 2015-01-03 SURGICAL SUPPLY — 25 items

## 2015-01-03 NOTE — Op Note (Signed)
[facility] [place], [address]   ENDOSCOPY PROCEDURE REPORT  PATIENT: Jacqueline, Orozco  MR#: [id number] BIRTHDATE: 1950-09-19 , 63  yrs. old GENDER: female ENDOSCOPIST: Jerene Bears, MD PROCEDURE DATE:  01/03/2015 PROCEDURE:  EGD, diagnostic ASA CLASS:     Class II INDICATIONS:  melena and history of gastric angiodysplasia treated with endo-clip in 2014. MEDICATIONS: Monitored anesthesia care and Per Anesthesia TOPICAL ANESTHETIC: Cetacaine Spray  DESCRIPTION OF PROCEDURE: After the risks benefits and alternatives of the procedure were thoroughly explained, informed consent was obtained.  The [id number] ([id number]) endoscope was introduced through the mouth and advanced to the second portion of the duodenum , Without limitations.  The instrument was slowly withdrawn as the mucosa was fully examined.  ESOPHAGUS: The mucosa of the esophagus appeared normal.  Z-line regular at 38 cm  STOMACH: A previously placed hemostatic clip was found in the gastric fundus.  No associated angiodysplastic lesion seen   The mucosa of the stomach appeared normal in the entire stomach.  DUODENUM: The duodenal mucosa showed no abnormalities in the bulb and 2nd part of the duodenum.  Retroflexed views revealed no abnormalities.     The scope was then withdrawn from the patient and the procedure completed.  COMPLICATIONS: There were no immediate complications.  ENDOSCOPIC IMPRESSION: 1.   The mucosa of the esophagus appeared normal 2.   Previously placed hemostatic clip found in the gastric fundus with no further evidence of angiodysplastic lesion 3.   The mucosa of the stomach appeared normal throughout 4.   The duodenal mucosa showed no abnormalities in the bulb and 2nd part of the duodenum  RECOMMENDATIONS: 1.  Continue current medications 2.  Proceed with a Colonoscopy.  eSigned:  Jerene Bears, MD 01/03/2015 10:24 AM    [id number] Adrian Blackwater, MD and The  Patient

## 2015-01-03 NOTE — Anesthesia Postprocedure Evaluation (Signed)
  Anesthesia Post-op Note  Patient: Jacqueline Orozco  Procedure(s) Performed: Procedure(s): COLONOSCOPY WITH PROPOFOL (N/A) ESOPHAGOGASTRODUODENOSCOPY (EGD) WITH PROPOFOL (N/A) HOT HEMOSTASIS (ARGON PLASMA COAGULATION/BICAP) (N/A)  Patient Location: Endoscopy Unit  Anesthesia Type:MAC  Level of Consciousness: awake, alert , oriented and patient cooperative  Airway and Oxygen Therapy: Patient Spontanous Breathing  Post-op Pain: none  Post-op Assessment: Post-op Vital signs reviewed, Patient's Cardiovascular Status Stable, Respiratory Function Stable, Patent Airway, No signs of Nausea or vomiting and Pain level controlled              Post-op Vital Signs: Reviewed and stable  Last Vitals:  Filed Vitals:   01/03/15 1050  BP: 151/84  Pulse: 59  Temp:   Resp: 11    Complications: No apparent anesthesia complications

## 2015-01-03 NOTE — Discharge Instructions (Addendum)
Monitored Anesthesia Care Monitored anesthesia care is an anesthesia service for a medical procedure. Anesthesia is the loss of the ability to feel pain. It is produced by medicines called anesthetics. It may affect a small area of your body (local anesthesia), a large area of your body (regional anesthesia), or your entire body (general anesthesia). The need for monitored anesthesia care depends your procedure, your condition, and the potential need for regional or general anesthesia. It is often provided during procedures where:  1. General anesthesia may be needed if there are complications. This is because you need special care when you are under general anesthesia.  2. You will be under local or regional anesthesia. This is so that you are able to have higher levels of anesthesia if needed.  3. You will receive calming medicines (sedatives). This is especially the case if sedatives are given to put you in a semi-conscious state of relaxation (deep sedation). This is because the amount of sedative needed to produce this state can be hard to predict. Too much of a sedative can produce general anesthesia. Monitored anesthesia care is performed by one or more health care providers who have special training in all types of anesthesia. You will need to meet with these health care providers before your procedure. During this meeting, they will ask you about your medical history. They will also give you instructions to follow. (For example, you will need to stop eating and drinking before your procedure. You may also need to stop or change medicines you are taking.) During your procedure, your health care providers will stay with you. They will:   Watch your condition. This includes watching your blood pressure, breathing, and level of pain.   Diagnose and treat problems that occur.   Give medicines if they are needed. These may include calming medicines (sedatives) and anesthetics.   Make sure you  are comfortable.  Having monitored anesthesia care does not necessarily mean that you will be under anesthesia. It does mean that your health care providers will be able to manage anesthesia if you need it or if it occurs. It also means that you will be able to have a different type of anesthesia than you are having if you need it. When your procedure is complete, your health care providers will continue to watch your condition. They will make sure any medicines wear off before you are allowed to go home.  Document Released: 02/20/2005 Document Revised: 10/11/2013 Document Reviewed: 07/08/2012 [facility] Patient Information 2015 [place], [place]. This information is not intended to replace advice given to you by your health care provider. Make sure you discuss any questions you have with your health care provider.  Esophagogastroduodenoscopy Care After Refer to this sheet in the next few weeks. These instructions provide you with information on caring for yourself after your procedure. Your caregiver may also give you more specific instructions. Your treatment has been planned according to current medical practices, but problems sometimes occur. Call your caregiver if you have any problems or questions after your procedure.  HOME CARE INSTRUCTIONS 4. Do not eat or drink anything until the numbing medicine (local anesthetic) has worn off and your gag reflex has returned. You will know that the local anesthetic has worn off when you can swallow comfortably. 5. Do not drive for 12 hours after the procedure or as directed by your caregiver. 6. Only take medicines as directed by your caregiver. SEEK MEDICAL CARE IF:   You cannot stop coughing.  You  are not urinating at all or less than usual. SEEK IMMEDIATE MEDICAL CARE IF: 1. You have difficulty swallowing. 2. You cannot eat or drink. 3. You have worsening throat or chest pain. 4. You have dizziness, lightheadedness, or you faint. 5. You have nausea  or vomiting. 6. You have chills. 7. You have a fever. 8. You have severe abdominal pain. 9. You have black, tarry, or bloody stools. Document Released: 05/13/2012 Document Reviewed: 05/13/2012 [facility] Patient Information 2015 [place]. This information is not intended to replace advice given to you by your health care provider. Make sure you discuss any questions you have with your health care provider.  Colonoscopy, Care After These instructions give you information on caring for yourself after your procedure. Your doctor may also give you more specific instructions. Call your doctor if you have any problems or questions after your procedure. HOME CARE 7. Do not drive for 24 hours. 8. Do not sign important papers or use machinery for 24 hours. 9. You may shower. 10. You may go back to your usual activities, but go slower for the first 24 hours. 11. Take rest breaks often during the first 24 hours. 12. Walk around or use warm packs on your belly (abdomen) if you have belly cramping or gas. 13. Drink enough fluids to keep your pee (urine) clear or pale yellow. 14. Resume your normal diet. Avoid heavy or fried foods. 15. Avoid drinking alcohol for 24 hours or as told by your doctor. 16. Only take medicines as told by your doctor. If a tissue sample (biopsy) was taken during the procedure:   Do not take aspirin or blood thinners for 7 days, or as told by your doctor.  Do not drink alcohol for 7 days, or as told by your doctor.  Eat soft foods for the first 24 hours. GET HELP IF: You still have a small amount of blood in your poop (stool) 2-3 days after the procedure. GET HELP RIGHT AWAY IF: 10. You have more than a small amount of blood in your poop. 11. You see clumps of tissue (blood clots) in your poop. 12. Your belly is puffy (swollen). 13. You feel sick to your stomach (nauseous) or throw up (vomit). 14. You have a fever. 15. You have belly pain that gets worse and  medicine does not help. MAKE SURE YOU:  Understand these instructions.  Will watch your condition.  Will get help right away if you are not doing well or get worse. Document Released: 06/29/2010 Document Revised: 06/01/2013 Document Reviewed: 02/01/2013 [facility] Patient Information 2015 [place], [place]. This information is not intended to replace advice given to you by your health care provider. Make sure you discuss any questions you have with your health care provider.   YOU HAD AN ENDOSCOPIC PROCEDURE TODAY: Refer to the procedure report that was given to you for any specific questions about what was found during the examination.  If the procedure report does not answer your questions, please call your gastroenterologist to clarify.  YOU SHOULD EXPECT: Some feelings of bloating in the abdomen. Passage of more gas than usual.  Walking can help get rid of the air that was put into your GI tract during the procedure and reduce the bloating. If you had a lower endoscopy (such as a colonoscopy or flexible sigmoidoscopy) you may notice spotting of blood in your stool or on the toilet paper.   DIET: Your first meal following the procedure should be a light meal and  then it is ok to progress to your normal diet.  A half-sandwich or bowl of soup is an example of a good first meal.  Heavy or fried foods are harder to digest and may make you feel nasueas or bloated.  Drink plenty of fluids but you should avoid alcoholic beverages for 24 hours.  ACTIVITY: Your care partner should take you home directly after the procedure.  You should plan to take it easy, moving slowly for the rest of the day.  You can resume normal activity the day after the procedure however you should NOT DRIVE or use heavy machinery for 24 hours (because of the sedation medicines used during the test).    SYMPTOMS TO REPORT IMMEDIATELY  A gastroenterologist can be reached at any hour.  Please call your doctor's office for any of the  following symptoms:   Following lower endoscopy (colonoscopy, flexible sigmoidoscopy)  Excessive amounts of blood in the stool  Significant tenderness, worsening of abdominal pains  Swelling of the abdomen that is new, acute  Fever of 100 or higher  Following upper endoscopy (EGD, EUS, ERCP)  Vomiting of blood or coffee ground material  New, significant abdominal pain  New, significant chest pain or pain under the shoulder blades  Painful or persistently difficult swallowing  New shortness of breath  Black, tarry-looking stools  FOLLOW UP: If any biopsies were taken you will be contacted by phone or by letter within the next 1-3 weeks.  Call your gastroenterologist if you have not heard about the biopsies in 3 weeks.  Please also call your gastroenterologist's office with any specific questions about appointments or follow up tests.

## 2015-01-03 NOTE — Addendum Note (Signed)
Addendum  created 01/03/15 1126 by Sharlette Dense, CRNA   Modules edited: Anesthesia Attestations

## 2015-01-03 NOTE — Op Note (Signed)
[facility] [place], [address]   COLONOSCOPY PROCEDURE REPORT  PATIENT: Jacqueline, Orozco  MR#: [id number] BIRTHDATE: May 18, 1951 , 63  yrs. old GENDER: female ENDOSCOPIST: Jerene Bears, MD PROCEDURE DATE:  01/03/2015 PROCEDURE:   Colonoscopy, surveillance First Screening Colonoscopy - Avg.  risk and is 50 yrs.  old or older - No.  Prior Negative Screening - Now for repeat screening. N/A  History of Adenoma - Now for follow-up colonoscopy & has been > or = to 3 yrs.  Yes hx of adenoma.  Has been 3 or more years since last colonoscopy.  Polyps removed today? No Recommend repeat exam, <10 yrs? No ASA CLASS:   Class II INDICATIONS:Surveillance due to prior colonic neoplasia and PH Colon Adenoma (2006), last colonoscopy 2011 (hyperplastic polyp only). MEDICATIONS: Monitored anesthesia care and Per Anesthesia  DESCRIPTION OF PROCEDURE:   After the risks benefits and alternatives of the procedure were thoroughly explained, informed consent was obtained.  The digital rectal exam revealed no abnormalities of the rectum.   The [id number] ([id number])  endoscope was introduced through the anus and advanced to the cecum, which was identified by both the appendix and ileocecal valve. No adverse events experienced.   The quality of the prep was good.  (Suprep was used)  The instrument was then slowly withdrawn as the colon was fully examined. Estimated blood loss is zero unless otherwise noted in this procedure report.    COLON FINDINGS:  Normal examined terminal ileum.   A normal appearing cecum, ileocecal valve, and appendiceal orifice were identified.  the ascending, transverse, descending, sigmoid colon, and rectum appeared unremarkable.  Retroflexed views revealed no abnormalities. The time to cecum = 3.5 Withdrawal time = 8.0   The scope was withdrawn and the procedure completed.  COMPLICATIONS: There were no complications.  ENDOSCOPIC IMPRESSION: Normal  colonoscopy  RECOMMENDATIONS: You should continue to follow colorectal cancer screening guidelines with a repeat colonoscopy in 10 years.  There is no need for FOBT (stool) testing for at least 5 years.  eSigned:  Jerene Bears, MD 01/03/2015 10:28 AM   cc: Boykin Nearing, MD and The Patient

## 2015-01-03 NOTE — Transfer of Care (Signed)
Immediate Anesthesia Transfer of Care Note  Patient: Gricelda Rapozo  Procedure(s) Performed: Procedure(s): COLONOSCOPY WITH PROPOFOL (N/A) ESOPHAGOGASTRODUODENOSCOPY (EGD) WITH PROPOFOL (N/A) HOT HEMOSTASIS (ARGON PLASMA COAGULATION/BICAP) (N/A)  Patient Location: Endoscopy Unit  Anesthesia Type:MAC  Level of Consciousness: awake  Airway & Oxygen Therapy: Patient Spontanous Breathing and Patient connected to nasal cannula oxygen  Post-op Assessment: Report given to RN and Post -op Vital signs reviewed and stable  Post vital signs: Reviewed and stable  Last Vitals:  Filed Vitals:   01/03/15 0935  BP: 142/81  Pulse: 79  Temp: 36.7 C  Resp: 19    Complications: No apparent anesthesia complications

## 2015-01-03 NOTE — H&P (Signed)
HPI: Jacqueline Orozco is a 64 yo female with PMH of gastric angiodysplasia, GERD, adenomatous colon polyps and IBS with constipation who presents for outpatient upper endoscopy and colonoscopy. She was seen in the office on 11/14/2014 and at that time was reporting intermittent black stools worrisome for melena. Forcefully hemoglobin was normal. Percent iron sat was very slightly low but ferritin was normal. She had not seen any rectal bleeding or hematochezia. Her appetite has been well. Upper endoscopy is recommended given intermittent black stools with history of gastric angiodysplastic lesion seen on upper endoscopy in 2014. Surveillance colonoscopy also due at this time with last colonoscopy in 2011 but history of adenomatous colon polyps.  Past Medical History  Diagnosis Date  . HEPATITIS B, CHRONIC 03/28/2010  . HYPERLIPIDEMIA 03/28/2010  . HYPERTENSION 03/28/2010  . Adenomatous colon polyp   . Anemia   . Lumbar disc disease 09/29/2013  . Cold sore   . Fatty liver   . Gastric AVM   . IBS (irritable bowel syndrome)   . GERD (gastroesophageal reflux disease)   . Internal hemorrhoids   . Allergy   . Blood transfusion without reported diagnosis   . PONV (postoperative nausea and vomiting)     headache also    Past Surgical History  Procedure Laterality Date  . Esophagogastroduodenoscopy endoscopy      several times     (Not in an outpatient encounter)  Allergies  Allergen Reactions  . Aspirin     stomach pain, stomach, bleeding  . Latex Itching  . Penicillins     Dizzy, vomiting  . Streptomycin     rash  . Tramadol Nausea Only    Family History  Problem Relation Age of Onset  . Hypertension Mother   . Liver disease Maternal Uncle   . Lung cancer Maternal Grandmother   . Stomach cancer Father     History  Substance Use Topics  . Smoking status: Never Smoker   . Smokeless tobacco: Never Used  . Alcohol Use: Yes     Comment: occasional wine    ROS: As per  history of present illness, otherwise negative  BP 142/81 mmHg  Pulse 79  Temp(Src) 98 F (36.7 C) (Oral)  Resp 19  SpO2 100%  Gen: awake, alert, NAD HEENT: anicteric, op clear CV: RRR, no mrg Pulm: CTA b/l Abd: soft, NT/ND, +BS throughout Ext: no c/c/e Neuro: nonfocal   RELEVANT LABS AND IMAGING: CBC    Component Value Date/Time   WBC 6.2 11/14/2014 1022   RBC 5.10 11/14/2014 1022   HGB 13.8 11/14/2014 1022   HCT 41.7 11/14/2014 1022   PLT 188.0 11/14/2014 1022   MCV 81.8 11/14/2014 1022   MCH 26.9 06/20/2014 1608   MCHC 33.0 11/14/2014 1022   RDW 13.9 11/14/2014 1022   LYMPHSABS 2.2 11/14/2014 1022   MONOABS 0.4 11/14/2014 1022   EOSABS 0.2 11/14/2014 1022   BASOSABS 0.0 11/14/2014 1022    CMP     Component Value Date/Time   NA 140 06/20/2014 1608   K 4.0 06/20/2014 1608   CL 102 06/20/2014 1608   CO2 28 06/20/2014 1608   GLUCOSE 89 06/20/2014 1608   BUN 17 06/20/2014 1608   CREATININE 0.89 06/20/2014 1608   CREATININE 0.8 12/29/2012 1000   CALCIUM 9.6 06/20/2014 1608   PROT 7.4 06/20/2014 1608   ALBUMIN 4.3 06/20/2014 1608   AST 20 06/20/2014 1608   ALT 20 06/20/2014 1608   ALKPHOS 67 06/20/2014 1608  BILITOT 0.6 06/20/2014 1608   GFRNONAA 70 06/20/2014 1608   GFRNONAA 66.85 03/27/2010 0953   GFRAA 81 06/20/2014 1608   GFRAA  03/19/2010 1820    >60        The eGFR has been calculated using the MDRD equation. This calculation has not been validated in all clinical situations. eGFR's persistently <60 mL/min signify possible Chronic Kidney Disease.   Iron/TIBC/Ferritin/ %Sat    Component Value Date/Time   IRON 59 11/14/2014 1022   FERRITIN 160.0 11/14/2014 1022   IRONPCTSAT 18.2* 11/14/2014 1022    ASSESSMENT/PLAN: 64 yo female with PMH of gastric angiodysplasia, GERD, adenomatous colon polyps and IBS with constipation who presents for outpatient upper endoscopy and colonoscopy.  1.  Possible melena/hx of gastric angiodysplasia -- upper  endoscopy today to evaluate possible melena. APC ablation if angiodysplastic lesion seen.  The nature of the procedure, as well as the risks, benefits, and alternatives were carefully and thoroughly reviewed with the patient. Ample time for discussion and questions allowed. The patient understood, was satisfied, and agreed to proceed.   2. Hx of colonic polyps -- surveillance colonoscopy today.  The nature of the procedure, as well as the risks, benefits, and alternatives were carefully and thoroughly reviewed with the patient. Ample time for discussion and questions allowed. The patient understood, was satisfied, and agreed to proceed.

## 2015-01-03 NOTE — Anesthesia Preprocedure Evaluation (Addendum)
Anesthesia Evaluation  Patient identified by MRN, date of birth, ID band Patient awake    Reviewed: Allergy & Precautions, NPO status , Patient's Chart, lab work & pertinent test results  History of Anesthesia Complications (+) PONV and history of anesthetic complications  Airway Mallampati: II  TM Distance: >3 FB Neck ROM: Full    Dental  (+) Caps, Dental Advisory Given   Pulmonary neg pulmonary ROS,  breath sounds clear to auscultation        Cardiovascular hypertension, Pt. on medications - anginaRhythm:Regular Rate:Normal     Neuro/Psych negative neurological ROS     GI/Hepatic GERD-  Medicated and Poorly Controlled,(+) Hepatitis -, BH/o gastric AVM   Endo/Other  negative endocrine ROS  Renal/GU negative Renal ROS     Musculoskeletal   Abdominal   Peds  Hematology negative hematology ROS (+)   Anesthesia Other Findings   Reproductive/Obstetrics                            Anesthesia Physical Anesthesia Plan  ASA: III  Anesthesia Plan: MAC   Post-op Pain Management:    Induction: Intravenous  Airway Management Planned: Natural Airway and Nasal Cannula  Additional Equipment:   Intra-op Plan:   Post-operative Plan:   Informed Consent: I have reviewed the patients History and Physical, chart, labs and discussed the procedure including the risks, benefits and alternatives for the proposed anesthesia with the patient or authorized representative who has indicated his/her understanding and acceptance.   Dental advisory given  Plan Discussed with: CRNA and Surgeon  Anesthesia Plan Comments: (Plan routine monitors, MAC)        Anesthesia Quick Evaluation

## 2015-01-04 ENCOUNTER — Encounter (HOSPITAL_COMMUNITY): Payer: Self-pay | Admitting: Internal Medicine

## 2015-01-18 ENCOUNTER — Telehealth: Payer: Self-pay | Admitting: Family Medicine

## 2015-01-18 NOTE — Telephone Encounter (Signed)
Patient came into facility to request a med refill for Amlodipine 5mg  and Hydrochlorothiazide 12.5mg . Patient uses [facility]. Please f/u with pt.

## 2015-01-18 NOTE — Telephone Encounter (Signed)
Patient called to request med  Refills for her bp medications. Please f/u

## 2015-01-18 NOTE — Telephone Encounter (Signed)
Pt has refills, need to call pharmacy

## 2015-01-19 NOTE — Telephone Encounter (Signed)
Pt has Refills at pharmacy

## 2015-04-11 ENCOUNTER — Ambulatory Visit: Payer: Self-pay | Attending: Family Medicine | Admitting: Pharmacist

## 2015-04-11 DIAGNOSIS — Z23 Encounter for immunization: Secondary | ICD-10-CM | POA: Insufficient documentation

## 2015-04-11 MED ORDER — INFLUENZA VAC SPLIT QUAD 0.5 ML IM SUSY
0.5000 mL | PREFILLED_SYRINGE | Freq: Once | INTRAMUSCULAR | Status: AC
  Administered 2015-04-11: 0.5 mL via INTRAMUSCULAR

## 2015-04-11 MED ORDER — INFLUENZA VAC SPLIT QUAD 0.5 ML IM SUSY: 0.5000 mL | PREFILLED_SYRINGE | INTRAMUSCULAR | Status: DC

## 2015-04-11 NOTE — Patient Instructions (Signed)
Influenza Virus Vaccine injection (Fluarix) ?y l thu?c g? THU?C CHU?NG NG??A VIRUS CU?M (INFLUENZA) giu?p gia?m nguy c? b? b?nh cu?m (influenza), cn ???c g?i l c?m cm (flu). Thu?c ny c th? ???c dng cho nh?ng m?c ?ch khc; hy h?i ng??i cung c?p d?ch v? y t? ho?c d??c s? c?a mnh, n?u qu v? c th?c m?c. Ti c?n ph?i bo cho ng??i cung c?p d?ch v? y t? c?a mnh ?i?u g tr??c khi dng thu?c ny? H? c?n bi?t li?u qu v? c b?t k? tnh tr?ng no sau ?y khng: -cc r?i loa?n v? ch?y mu, ch?ng h?n nh? b?nh ?a ch?y mu (hemophilia) -s?t, nhi?m trng -H?i ch??ng Guillain-Barre ho??c ca?c v?n ?? khc v? th?n kinh -cc v?n ?? v? h? mi?n d?ch -nhi?m HIV ho?c AIDS -l??ng ti?u c?u trong ma?u th?p -ch?ng ?a x? c?ng -pha?n ??ng b?t th???ng ho??c di? ??ng v??i thu?c chu?ng ng??a virus cu?m (influenza), tr??ng, protein cu?a ga?, nh?a latex ho??c gentamicin -pha?n ??ng b?t th???ng ho??c di? ??ng v??i ca?c d??c ph?m kha?c, th?c ph?m, thu?c nhu?m, ho??c ch?t ba?o qua?n -?ang c thai ho??c ??nh co? thai -?ang cho con bu? Ti nn s? d?ng thu?c ny nh? th? no? Thu?c ny ?? tim vo b?p th?t. Thu?c th??ng ???c s? d?ng b?i chuyn vin y t?. T? [place] v? Thu?c Ch?ng Ng?a s? ???c pht tr??c m?i l?n ch?ng ng?a. Hy ??c k? t? thng tin ny m?i l?n. T? thng tin c th? thay ??i th??ng xuyn. Hy bn v?i bc s? nhi khoa c?a qu v? v? vi?c dng thu?c ny ? tr? em. C th? c?n ch?m [place] ??c bi?t. Qu li?u: N?u qu v? cho r?ng mnh ? dng qu nhi?u thu?c ny, th hy lin l?c v?i trung tm ki?m sot ch?t ??c ho?c phng c?p c?u ngay l?p t?c. L?U : Thu?c ny ch? dnh ring cho qu v?. Khng chia s? thu?c ny v?i nh?ng ng??i khc. N?u ti l? qun m?t li?u th sao? ?i?u ny khng p d?ng. Nh?ng g c th? t??ng tc v?i thu?c ny? -ho?a tri? li?u ho??c xa? tri? li?u -m?t s? thu?c la?m y?u h? th?ng mi?n d?ch c?a qu v?, ch?ng h?n nh? etanercept, anakinra, infliximab, va? adalimumab -m?t s? thu?c  ?i?u tr? ho?c phng ng?a c?c mu ?ng, ch?ng h?n nh? warfarin -phenytoin -cc thu?c steroid, ch?ng h?n nh? prednisone ho?c cortisone -theophylline -cc thu?c ch?ng ng?a Danh sch ny c th? khng m t? ?? h?t cc t??ng tc c th? x?y ra. Hy ??a cho ng??i cung c?p d?ch v? y t? c?a mnh danh sch t?t c? cc thu?c, th?o d??c, cc thu?c khng c?n toa, ho?c cc ch? ph?m b? sung m qu v? dng. C?ng nn bo cho h? bi?t r?ng qu v? c ht thu?c, u?ng r??u, ho?c c s? d?ng ma ty tri php hay khng. Vi th? c th? t??ng tc v?i thu?c c?a qu v?. Ti c?n ph?i theo di ?i?u g trong khi dng thu?c ny? Ha?y ba?o cho ba?c si? ho??c chuyn vin y t? bi?t b?t ky? ta?c du?ng phu? na?o khng bi?n m?t trong vo?ng 3 nga?y. Ha?y lin la?c v??i ba?c si? ho??c chuyn vin y t? c?a mnh, n?u b?t ky? tri?u ch??ng b?t th???ng na?o xu?t hi?n trong vo?ng 6 Berdella?n k? t?? khi tim thu?c chu?ng ng??a na?y. Quy? vi? v?n co? th? b? cu?m, nh?ng b?nh se? khng n??ng nh? th???ng l?. Quy? vi? khng th? b? cu?m do thu?c chu?ng ng??a. Thu?c chu?ng ng??a  se? khng ba?o v? kho?i cc ch?ng ca?m la?nh ho??c ca?c b?nh co? th? gy s?t khc. C?n tim thu?c chu?ng ng??a na?y ha?ng n?m. Ti c th? nh?n th?y nh?ng tc d?ng ph? no khi dng thu?c ny? Nh?ng tc d?ng ph? qu v? c?n ph?i bo cho bc s? ho?c chuyn vin y t? cng s?m cng t?t: -cc ph?n ?ng d? ?ng, ch?ng h?n nh? da b? m?n ??, ng?a, n?i my ?ay, s?ng ? m?t, mi, ho?c l??i Cc tc d?ng ph? khng c?n ph?i ch?m Kerr y t? (hy bo cho bc s? ho?c chuyn vin y t?, n?u cc tc d?ng ph? ny ti?p di?n ho?c gy phi?n toi): -s?t -?au ??u -?au ho?c nh?c c? b?p -b? ?au,  ?m, ??, ho?c s?ng ? ch? tim -m?t m?i ho?c y?u ?t Danh sch ny c th? khng m t? ?? h?t cc tc d?ng ph? c th? x?y ra. Xin g?i t?i bc s? c?a mnh ?? ???c c? v?n chuyn mn v? cc tc d?ng ph?Sander Nephew v? c th? t??ng trnh cc tc d?ng ph? cho FDA theo s? [phone]. Ti nn c?t gi? thu?c c?a  mnh ? ?u? Thu?c chu?ng ng??a na?y se? ????c tim b??i chuyn vin y t? ta?i pho?ng ma?ch, hi?u thu?c, v?n pho?ng ba?c si? ho??c c? s?? y t? kha?c. Thu?c ny khng ???c c?t gi? t?i nh. L?U : ?y l b?n tm t?t. N c th? khng bao hm t?t c? thng tin c th? c. N?u qu v? th?c m?c v? thu?c ny, xin trao ??i v?i bc s?, d??c s?, ho?c ng??i cung c?p d?ch v? y t? c?a mnh.    2016, [employer] Standard. (2011-07-02 00:00:00)

## 2015-05-15 ENCOUNTER — Other Ambulatory Visit: Payer: Self-pay | Admitting: Family Medicine

## 2015-05-15 DIAGNOSIS — I10 Essential (primary) hypertension: Secondary | ICD-10-CM

## 2015-05-15 MED ORDER — AMLODIPINE BESYLATE 5 MG PO TABS: 10.0000 mg | ORAL_TABLET | Freq: Every day | ORAL | Status: DC

## 2015-05-15 NOTE — Telephone Encounter (Signed)
Nurse called patient via [employer], [place]. Interpreter reports home number keeps ringing, has no voice mail. Interpreter called mobile number, left voicemail for patient to return call to [facility] at [phone]. Nurse called patient to make patient aware of need to have appointment scheduled for HTN prior to additional refills.  Nurse sent medication refills to pharmacy for 1 month supply for HCTZ and amlodipine.  Per OV on 06/30/14: Increase HCTZ to 25 mg daily, Add norvasc 5 mg daily.

## 2015-05-23 ENCOUNTER — Other Ambulatory Visit: Payer: Self-pay

## 2015-05-23 DIAGNOSIS — Z1231 Encounter for screening mammogram for malignant neoplasm of breast: Secondary | ICD-10-CM

## 2015-06-12 MED FILL — HYDROCHLOROTHIAZIDE 25 MG T: 25 | 30 days supply | Qty: 30 | Fill #1

## 2015-06-12 MED FILL — DICYCLOMINE 20 MG TABLET: 20 | 30 days supply | Qty: 90 | Fill #6

## 2015-06-12 MED FILL — ?AMLODIPINE BESYLATE 5 MG T: 5 | 15 days supply | Qty: 30 | Fill #1

## 2015-06-26 ENCOUNTER — Other Ambulatory Visit: Payer: Self-pay | Admitting: Family Medicine

## 2015-06-26 ENCOUNTER — Ambulatory Visit
Admission: RE | Admit: 2015-06-26 | Discharge: 2015-06-26 | Disposition: A | Discharge status: Home / Self Care | Payer: No Typology Code available for payment source | Source: Ambulatory Visit

## 2015-06-26 DIAGNOSIS — Z1231 Encounter for screening mammogram for malignant neoplasm of breast: Secondary | ICD-10-CM

## 2015-07-17 ENCOUNTER — Ambulatory Visit: Payer: Self-pay

## 2015-07-18 ENCOUNTER — Ambulatory Visit: Payer: Self-pay | Attending: Family Medicine

## 2015-07-28 ENCOUNTER — Encounter: Payer: Self-pay | Admitting: Family Medicine

## 2015-07-28 ENCOUNTER — Ambulatory Visit: Payer: Self-pay | Attending: Family Medicine | Admitting: Family Medicine

## 2015-07-28 VITALS — BP 153/83 | HR 59 | Temp 98.1°F | Resp 16 | Ht 61.25 in | Wt 161.0 lb

## 2015-07-28 DIAGNOSIS — Y990 Civilian activity done for income or pay: Secondary | ICD-10-CM | POA: Insufficient documentation

## 2015-07-28 DIAGNOSIS — Z79899 Other long term (current) drug therapy: Secondary | ICD-10-CM | POA: Insufficient documentation

## 2015-07-28 DIAGNOSIS — K589 Irritable bowel syndrome without diarrhea: Secondary | ICD-10-CM | POA: Insufficient documentation

## 2015-07-28 DIAGNOSIS — I10 Essential (primary) hypertension: Secondary | ICD-10-CM | POA: Insufficient documentation

## 2015-07-28 DIAGNOSIS — E785 Hyperlipidemia, unspecified: Secondary | ICD-10-CM | POA: Insufficient documentation

## 2015-07-28 DIAGNOSIS — Z0000 Encounter for general adult medical examination without abnormal findings: Secondary | ICD-10-CM

## 2015-07-28 DIAGNOSIS — M5412 Radiculopathy, cervical region: Secondary | ICD-10-CM | POA: Insufficient documentation

## 2015-07-28 DIAGNOSIS — M25531 Pain in right wrist: Secondary | ICD-10-CM | POA: Insufficient documentation

## 2015-07-28 DIAGNOSIS — M19031 Primary osteoarthritis, right wrist: Secondary | ICD-10-CM | POA: Insufficient documentation

## 2015-07-28 LAB — POCT GLYCOSYLATED HEMOGLOBIN (HGB A1C): HEMOGLOBIN A1C: 5.8

## 2015-07-28 MED ORDER — ACETAMINOPHEN ER 650 MG PO TBCR: 650.0000 mg | EXTENDED_RELEASE_TABLET | Freq: Three times a day (TID) | ORAL | Status: AC | PRN

## 2015-07-28 MED ORDER — HYDROCHLOROTHIAZIDE 25 MG PO TABS: 25.0000 mg | ORAL_TABLET | Freq: Every day | ORAL | Status: DC

## 2015-07-28 MED ORDER — ATORVASTATIN CALCIUM 20 MG PO TABS: 20.0000 mg | ORAL_TABLET | Freq: Every day | ORAL | Status: DC

## 2015-07-28 MED ORDER — AMLODIPINE BESYLATE 10 MG PO TABS: 10.0000 mg | ORAL_TABLET | Freq: Every day | ORAL | Status: DC

## 2015-07-28 MED ORDER — DOCUSATE SODIUM 100 MG PO CAPS: 100.0000 mg | ORAL_CAPSULE | Freq: Every day | ORAL | Status: DC | PRN

## 2015-07-28 MED ORDER — POLYETHYLENE GLYCOL 3350 17 GM/SCOOP PO POWD: 17.0000 g | Freq: Every day | ORAL | Status: DC

## 2015-07-28 MED ORDER — DICYCLOMINE HCL 20 MG PO TABS
20.0000 mg | ORAL_TABLET | Freq: Three times a day (TID) | ORAL | Status: DC
Start: 2015-07-28 — End: 2016-07-19

## 2015-07-28 MED ORDER — OMEPRAZOLE 40 MG PO CPDR: 40.0000 mg | DELAYED_RELEASE_CAPSULE | Freq: Every day | ORAL | Status: DC

## 2015-07-28 MED FILL — ATORVASTATIN 20 MG TABLET: 20 | 30 days supply | Qty: 30 | Fill #0

## 2015-07-28 MED FILL — HYDROCHLOROTHIAZIDE 25 MG T: 25 | 30 days supply | Qty: 30 | Fill #0

## 2015-07-28 MED FILL — OMEPRAZOLE DR 40 MG CAPSULE: 40 | 30 days supply | Qty: 30 | Fill #0

## 2015-07-28 MED FILL — AMLODIPINE BESYLATE 10 MG T: 10 | 30 days supply | Qty: 30 | Fill #0

## 2015-07-28 MED FILL — DICYCLOMINE 20 MG TABLET: 20 | 30 days supply | Qty: 90 | Fill #0

## 2015-07-28 NOTE — Patient Instructions (Signed)
Jacqueline Orozco was seen today for hypertension.  Diagnoses and all orders for this visit:  Healthcare maintenance -     POCT glycosylated hemoglobin (Hb A1C)  Essential hypertension -     amLODipine (NORVASC) 10 MG tablet; Take 1 tablet (10 mg total) by mouth daily. -     hydrochlorothiazide (HYDRODIURIL) 25 MG tablet; Take 1 tablet (25 mg total) by mouth daily.  Cervical radiculitis -     acetaminophen (TYLENOL 8 HOUR) 650 MG CR tablet; Take 1 tablet (650 mg total) by mouth every 8 (eight) hours as needed for pain.  HLD (hyperlipidemia) -     atorvastatin (LIPITOR) 20 MG tablet; Take 1 tablet (20 mg total) by mouth daily.  IBS (irritable bowel syndrome) -     dicyclomine (BENTYL) 20 MG tablet; Take 1 tablet (20 mg total) by mouth 3 (three) times daily before meals. -     docusate sodium (COLACE) 100 MG capsule; Take 1 capsule (100 mg total) by mouth daily as needed for mild constipation. -     polyethylene glycol powder (GLYCOLAX/MIRALAX) powder; Take 17 g by mouth daily. -     omeprazole (PRILOSEC) 40 MG capsule; Take 1 capsule (40 mg total) by mouth daily.   F/u in 3 weeks for pharmacy BP check  F/u with me in 3 months for HTN   Dr. Adrian Blackwater

## 2015-07-28 NOTE — Assessment & Plan Note (Signed)
A: R wrist OA P: Tylenol Ice  Bracing Topical antiinflammatory

## 2015-07-28 NOTE — Assessment & Plan Note (Signed)
A; Elevated BP Med: non compliant P:  Restart norvasc and HCTZ

## 2015-07-28 NOTE — Progress Notes (Signed)
Subjective:  Patient ID: Jacqueline Orozco, female    DOB: 12-Jul-1950  Age: 65 y.o. MRN: [id number]  CC: Hypertension   HPI [name] presents for   1. CHRONIC HYPERTENSION  Disease Monitoring  Blood pressure range: not checking   Chest pain: no   Dyspnea: no   Claudication: no   Medication compliance: no meds for one year  Medication Side Effects  Lightheadedness: no   Urinary frequency: no   Edema: no   Impotence:    Preventitive Healthcare:  Exercise: no   Diet Pattern  Salt Restriction: yes  2. R wrist pain: radial wrist pain x 8 years since work related injury. Mild swelling. Pain worse with use of R hand. No recent injury. No rash. Tylenol and topical antiinflammatory help.    Social History  Substance Use Topics  . Smoking status: Never Smoker   . Smokeless tobacco: Never Used  . Alcohol Use: Yes     Comment: occasional wine    Outpatient Prescriptions Prior to Visit  Medication Sig Dispense Refill  . acetaminophen (TYLENOL 8 HOUR) 650 MG CR tablet Take 1 tablet (650 mg total) by mouth every 8 (eight) hours as needed for pain. 90 tablet 1  . amLODipine (NORVASC) 5 MG tablet Take 2 tablets (10 mg total) by mouth daily. 30 tablet 5  . atorvastatin (LIPITOR) 20 MG tablet Take 1 tablet (20 mg total) by mouth daily. 90 tablet 3  . dicyclomine (BENTYL) 20 MG tablet Take 1 tablet (20 mg total) by mouth 3 (three) times daily before meals. 90 tablet 11  . docusate sodium (COLACE) 100 MG capsule Take 1 capsule (100 mg total) by mouth daily as needed for mild constipation. 90 capsule 1  . hydrochlorothiazide (HYDRODIURIL) 25 MG tablet TAKE 1 TABLET BY MOUTH DAILY. 90 tablet 1  . omeprazole (PRILOSEC) 20 MG capsule TAKE 2 CAPSULES BY MOUTH DAILY 30 MINUTES BEFORE SUPPER 180 capsule 0  . polyethylene glycol powder (GLYCOLAX/MIRALAX) powder MIX 17 GRAMS INTO LIQUID AND DRINK ONCE A DAY 714 g 2   No facility-administered medications prior to visit.    ROS Review of Systems    Constitutional: Negative for fever and chills.  Eyes: Negative for visual disturbance.  Respiratory: Negative for shortness of breath.   Cardiovascular: Negative for chest pain.  Gastrointestinal: Negative for abdominal pain and blood in stool.  Musculoskeletal: Positive for arthralgias. Negative for back pain.  Skin: Negative for rash.  Allergic/Immunologic: Negative for immunocompromised state.  Neurological: Positive for headaches.  Hematological: Negative for adenopathy. Does not bruise/bleed easily.  Psychiatric/Behavioral: Negative for suicidal ideas and dysphoric mood.    Objective:  BP 153/83 mmHg  Pulse 59  Temp(Src) 98.1 F (36.7 C) (Oral)  Resp 16  Ht 5' 1.25" (1.556 m)  Wt 161 lb (73.029 kg)  BMI 30.16 kg/m2  SpO2 97%  BP/Weight 07/28/2015 [id number]  Systolic BP 0000000 [id number]  Diastolic BP 83 84 64  Wt. (Lbs) 161 - 160  BMI 30.16 - 29.98    Physical Exam  Constitutional: She is oriented to person, place, and time. She appears well-developed and well-nourished. No distress.  HENT:  Head: Normocephalic and atraumatic.  Cardiovascular: Normal rate, regular rhythm, normal heart sounds and intact distal pulses.   Pulmonary/Chest: Effort normal and breath sounds normal.  Musculoskeletal: She exhibits no edema.       Left wrist: She exhibits swelling. She exhibits normal range of motion, no tenderness, no bony tenderness, no crepitus, no  deformity and no laceration.  Neurological: She is alert and oriented to person, place, and time.  Skin: Skin is warm and dry. No rash noted.  Psychiatric: She has a normal mood and affect.   Lab Results  Component Value Date   HGBA1C 5.80 07/28/2015     Assessment & Plan:   Leon was seen today for hypertension.  Diagnoses and all orders for this visit:  Healthcare maintenance -     POCT glycosylated hemoglobin (Hb A1C)  Essential hypertension -     Discontinue: amLODipine (NORVASC) 10 MG tablet; Take 1 tablet (10  mg total) by mouth daily. -     hydrochlorothiazide (HYDRODIURIL) 25 MG tablet; Take 1 tablet (25 mg total) by mouth daily. -     amLODipine (NORVASC) 10 MG tablet; Take 1 tablet (10 mg total) by mouth daily.  Cervical radiculitis -     acetaminophen (TYLENOL 8 HOUR) 650 MG CR tablet; Take 1 tablet (650 mg total) by mouth every 8 (eight) hours as needed for pain.  HLD (hyperlipidemia) -     atorvastatin (LIPITOR) 20 MG tablet; Take 1 tablet (20 mg total) by mouth daily.  IBS (irritable bowel syndrome) -     dicyclomine (BENTYL) 20 MG tablet; Take 1 tablet (20 mg total) by mouth 3 (three) times daily before meals. -     docusate sodium (COLACE) 100 MG capsule; Take 1 capsule (100 mg total) by mouth daily as needed for mild constipation. -     polyethylene glycol powder (GLYCOLAX/MIRALAX) powder; Take 17 g by mouth daily. -     omeprazole (PRILOSEC) 40 MG capsule; Take 1 capsule (40 mg total) by mouth daily.    No orders of the defined types were placed in this encounter.    Follow-up: No Follow-up on file.   Boykin Nearing MD

## 2015-07-28 NOTE — Progress Notes (Signed)
Elevated BP No medication x 1 month No pain today  No tobacco user  No suicidal thought in the past two weeks   Used video interpreter [place] [phone]

## 2015-08-22 ENCOUNTER — Ambulatory Visit: Payer: Self-pay | Attending: Family Medicine | Admitting: Pharmacist

## 2015-08-22 VITALS — BP 138/85 | HR 64

## 2015-08-22 DIAGNOSIS — I10 Essential (primary) hypertension: Secondary | ICD-10-CM | POA: Insufficient documentation

## 2015-08-22 DIAGNOSIS — Z79899 Other long term (current) drug therapy: Secondary | ICD-10-CM | POA: Insufficient documentation

## 2015-08-22 NOTE — Patient Instructions (Signed)
Thanks for coming to see me!  Your blood pressure looks great  Follow up with Dr. Adrian Blackwater

## 2015-08-22 NOTE — Progress Notes (Signed)
S:    Patient arrives in good spirits.    Presents to the clinic for hypertension evaluation. Patient was referred on 07/28/15 by Dr. Adrian Blackwater.  Patient was last seen by Primary Care Provider on 07/28/15. A [place] interpreter was used for the entirety of the visit Beverlee Nims [phone]).  Patient reports adherence with medications.  Current BP Medications include:  Amlodipine 10 mg daily, HCTZ 25 mg daily.  Antihypertensives tried in the past include: metoprolol (bradycardia)    O:   Last 3 Office BP readings: BP Readings from Last 3 Encounters:  07/28/15 153/83  01/03/15 151/84  11/14/14 118/64    BMET    Component Value Date/Time   NA 140 06/20/2014 1608   K 4.0 06/20/2014 1608   CL 102 06/20/2014 1608   CO2 28 06/20/2014 1608   GLUCOSE 89 06/20/2014 1608   BUN 17 06/20/2014 1608   CREATININE 0.89 06/20/2014 1608   CREATININE 0.8 12/29/2012 1000   CALCIUM 9.6 06/20/2014 1608   GFRNONAA 70 06/20/2014 1608   GFRNONAA 66.85 03/27/2010 0953   GFRAA 81 06/20/2014 1608   GFRAA  03/19/2010 1820    >60        The eGFR has been calculated using the MDRD equation. This calculation has not been validated in all clinical situations. eGFR's persistently <60 mL/min signify possible Chronic Kidney Disease.    A/P: Hypertension longstanding currently controlled on current medications based on goal blood pressure of <150/90.  Continued amlodipine 10 mg daily and HCTZ 25 mg daily. Patient educated on refills and to get refills on time from the pharmacy to avoid missing any doses of her medications.   Results reviewed and written information provided.   Total time in face-to-face counseling 20 minutes.   F/U Clinic Visit with Dr. Adrian Blackwater.  Patient seen with Jamie Brookes, PharmD Candidate

## 2015-08-24 MED FILL — AMLODIPINE BESYLATE 10 MG T: 10 | 30 days supply | Qty: 30 | Fill #1

## 2015-08-24 MED FILL — OMEPRAZOLE DR 40 MG CAPSULE: 40 | 30 days supply | Qty: 30 | Fill #1

## 2015-08-24 MED FILL — DICYCLOMINE 20 MG TABLET: 20 | 30 days supply | Qty: 90 | Fill #1

## 2015-08-24 MED FILL — HYDROCHLOROTHIAZIDE 25 MG T: 25 | 30 days supply | Qty: 30 | Fill #1

## 2015-09-12 MED FILL — ATORVASTATIN 20 MG TABLET: 20 | 30 days supply | Qty: 30 | Fill #1

## 2015-09-12 MED FILL — POLYETHYLENE GLYCOL 3350: 30 days supply | Qty: 510 | Fill #0

## 2015-09-25 MED FILL — ?AMLODIPINE BESYLATE 10 MG: 10 | 30 days supply | Qty: 30 | Fill #2

## 2015-09-25 MED FILL — ?HYDROCHLOROTHIAZIDE 25 MG: 25 MG | 30 days supply | Qty: 30 | Fill #2

## 2015-09-25 MED FILL — OMEPRAZOLE DR 40 MG CAPSULE: 40 | 30 days supply | Qty: 30 | Fill #2

## 2015-09-25 MED FILL — ?DICYCLOMINE 20 MG TABLET: 20 | 30 days supply | Qty: 90 | Fill #2

## 2015-10-09 MED FILL — ?DICYCLOMINE 20 MG TABLET: 20 | 30 days supply | Qty: 90 | Fill #3

## 2015-10-09 MED FILL — POLYETHYLENE GLYCOL 3350: 30 days supply | Qty: 510 | Fill #1

## 2015-10-09 MED FILL — ATORVASTATIN 20 MG TABLET: 20 | 30 days supply | Qty: 30 | Fill #2

## 2015-10-23 MED FILL — ?HYDROCHLOROTHIAZIDE 25 MG: 25 MG | 30 days supply | Qty: 30 | Fill #3

## 2015-10-23 MED FILL — AMLODIPINE BESYLATE 10 MG T: 10 | 30 days supply | Qty: 30 | Fill #3

## 2015-10-23 MED FILL — OMEPRAZOLE DR 40 MG CAPSULE: 40 | 30 days supply | Qty: 30 | Fill #3

## 2015-11-09 MED FILL — ?DICYCLOMINE 20 MG TABLET: 20 | 30 days supply | Qty: 90 | Fill #4

## 2015-11-09 MED FILL — ?ATORVASTATIN 20 MG TABLET: 20 | 30 days supply | Qty: 30 | Fill #3

## 2015-11-09 MED FILL — POLYETHYLENE GLYCOL 3350: 30 days supply | Qty: 510 | Fill #2

## 2015-11-23 ENCOUNTER — Encounter: Payer: Self-pay | Admitting: Family Medicine

## 2015-11-23 ENCOUNTER — Ambulatory Visit: Payer: Self-pay | Attending: Family Medicine | Admitting: Family Medicine

## 2015-11-23 VITALS — BP 131/77 | HR 60 | Temp 98.7°F | Resp 16 | Ht 63.0 in | Wt 154.0 lb

## 2015-11-23 DIAGNOSIS — M79644 Pain in right finger(s): Secondary | ICD-10-CM | POA: Insufficient documentation

## 2015-11-23 DIAGNOSIS — Z79899 Other long term (current) drug therapy: Secondary | ICD-10-CM | POA: Insufficient documentation

## 2015-11-23 DIAGNOSIS — S60351A Superficial foreign body of right thumb, initial encounter: Secondary | ICD-10-CM

## 2015-11-23 DIAGNOSIS — I10 Essential (primary) hypertension: Secondary | ICD-10-CM

## 2015-11-23 DIAGNOSIS — E785 Hyperlipidemia, unspecified: Secondary | ICD-10-CM

## 2015-11-23 DIAGNOSIS — R21 Rash and other nonspecific skin eruption: Secondary | ICD-10-CM | POA: Insufficient documentation

## 2015-11-23 DIAGNOSIS — M79641 Pain in right hand: Secondary | ICD-10-CM | POA: Insufficient documentation

## 2015-11-23 MED FILL — ?AMLODIPINE BESYLATE 10 MG: 10 | 30 days supply | Qty: 30 | Fill #4

## 2015-11-23 MED FILL — OMEPRAZOLE DR 40 MG CAPSULE: 40 | 30 days supply | Qty: 30 | Fill #4

## 2015-11-23 MED FILL — HYDROCHLOROTHIAZIDE 25 MG T: 25 | 30 days supply | Qty: 30 | Fill #4

## 2015-11-23 NOTE — Progress Notes (Signed)
C/C stuck with poison Ivy on 1st rt finger  Pain scale #6-7 Unable to touch anything due to pain  HA and dizziness x 2 weeks  No tobacco user  No suicidal thoughts in the past two weeks

## 2015-11-23 NOTE — Assessment & Plan Note (Signed)
Well-controlled.  Continue current regimen. 

## 2015-11-23 NOTE — Assessment & Plan Note (Signed)
Pain in finger tip without definite foreign body. No infection or abscess.  Plan for watchful waiting F/u in 4 weeks

## 2015-11-23 NOTE — Progress Notes (Signed)
Subjective:  Patient ID: Jacqueline Orozco, female    DOB: [birth year]  Age: 65 y.o. MRN: [id number]  CC: Hypertension and Rash   HPI Rarity Mcgowen presents for   1. HTN: some chronic HA and dizziness. No CP, SOB or edema. Compliant with current med regimen.  2. R thumb pain: x 1 month. She believes she has a rose thorn in her finger tip. There is no redness or swelling. There is a puncture site that has not changed. Pain is 6-7/10.    Social History  Substance Use Topics  . Smoking status: Never Smoker   . Smokeless tobacco: Never Used  . Alcohol Use: Yes     Comment: occasional wine   Outpatient Prescriptions Prior to Visit  Medication Sig Dispense Refill  . acetaminophen (TYLENOL 8 HOUR) 650 MG CR tablet Take 1 tablet (650 mg total) by mouth every 8 (eight) hours as needed for pain. 90 tablet 1  . amLODipine (NORVASC) 10 MG tablet Take 1 tablet (10 mg total) by mouth daily. 90 tablet 3  . atorvastatin (LIPITOR) 20 MG tablet Take 1 tablet (20 mg total) by mouth daily. 90 tablet 3  . dicyclomine (BENTYL) 20 MG tablet Take 1 tablet (20 mg total) by mouth 3 (three) times daily before meals. 90 tablet 11  . docusate sodium (COLACE) 100 MG capsule Take 1 capsule (100 mg total) by mouth daily as needed for mild constipation. 90 capsule 1  . hydrochlorothiazide (HYDRODIURIL) 25 MG tablet Take 1 tablet (25 mg total) by mouth daily. 90 tablet 3  . omeprazole (PRILOSEC) 40 MG capsule Take 1 capsule (40 mg total) by mouth daily. 90 capsule 3  . polyethylene glycol powder (GLYCOLAX/MIRALAX) powder Take 17 g by mouth daily. 714 g 2   No facility-administered medications prior to visit.    ROS Review of Systems  Constitutional: Negative for fever and chills.  Eyes: Negative for visual disturbance.  Respiratory: Negative for shortness of breath.   Cardiovascular: Negative for chest pain.  Gastrointestinal: Negative for abdominal pain and blood in stool.  Musculoskeletal: Positive for arthralgias.  Negative for back pain.  Skin: Negative for rash.  Allergic/Immunologic: Negative for immunocompromised state.  Neurological: Positive for dizziness and headaches.  Hematological: Negative for adenopathy. Does not bruise/bleed easily.  Psychiatric/Behavioral: Negative for suicidal ideas and dysphoric mood.    Objective:  BP 131/77 mmHg  Pulse 60  Temp(Src) 98.7 F (37.1 C) (Oral)  Resp 16  Ht 5\' 3"  (1.6 m)  Wt 154 lb (69.854 kg)  BMI 27.29 kg/m2  SpO2 100%  BP/Weight 11/23/2015 08/22/2015 99991111  Systolic BP [id number] 0000000 0000000  Diastolic BP 77 85 83  Wt. (Lbs) 154 - 161  BMI 27.29 - 30.16   Physical Exam  Constitutional: She is oriented to person, place, and time. She appears well-developed and well-nourished. No distress.  HENT:  Head: Normocephalic and atraumatic.  Cardiovascular: Normal rate, regular rhythm, normal heart sounds and intact distal pulses.   Pulmonary/Chest: Effort normal and breath sounds normal.  Musculoskeletal: She exhibits no edema.       Hands: Neurological: She is alert and oriented to person, place, and time.  Skin: Skin is warm and dry. No rash noted.  Psychiatric: She has a normal mood and affect.    Incision and Debridement Procedure Note  Pre-operative Diagnosis: foreign body in R thumb, soft tissue   Post-operative Diagnosis: R thumb  pain  Indications: pain   Anesthesia: 1% plain lidocaine  Procedure Details  The procedure, risks and complications have been discussed in detail (including, but not limited to injury, infection, bleeding) with the patient, and the patient has signed consent to the procedure.  The skin was sterilely prepped and draped over the affected area in the usual fashion. After adequate local anesthesia, and R thumb digital nerve block done. Incision with 23 gauge needle tip. Blunt  Dissection with forceps.   Findings: There was no definite foreign body in her thumb   Condition: Tolerated procedure  well   Complications: none.   Assessment & Plan:   Tiffani was seen today for hypertension and hand pain.  Diagnoses and all orders for this visit:  HLD (hyperlipidemia) -     Lipid Panel  Essential hypertension -     COMPLETE METABOLIC PANEL WITH GFR  Pain of right thumb  Superficial foreign body of right thumb, initial encounter    No orders of the defined types were placed in this encounter.    Follow-up: No Follow-up on file.   Boykin Nearing MD

## 2015-11-23 NOTE — Patient Instructions (Addendum)
Jacqueline Orozco was seen today for hypertension and rash.  Diagnoses and all orders for this visit:  HLD (hyperlipidemia) -     Lipid Panel  Essential hypertension -     COMPLETE METABOLIC PANEL WITH GFR  Pain of right thumb  Superficial foreign body of right thumb, initial encounter    Attempted foreign body removal from R thumb today. No definite foreign body visualized or removed  Plan: Keep skin clean and dry  We will not plan for x-ray as thorns are not able to be visualized on x-ray  F/u in 4 weeks for recheck for R thumb   Dr. Adrian Blackwater   Sliver Removal, Care After A sliver--also called a splinter--is a small and thin broken piece of an object that gets stuck (embedded) under the skin. A sliver can create a deep wound that can easily become infected. It is important to care for the wound after a sliver is removed to help prevent infection and other problems from developing. WHAT TO EXPECT AFTER THE PROCEDURE Slivers often break into smaller pieces when they are removed. If pieces of your sliver broke off and stayed in your skin, you will eventually see them working themselves out and you may feel some pain at the wound site. This is normal. HOME CARE INSTRUCTIONS  Keep all follow-up visits as directed by your health care provider. This is important.  There are many different ways to close and cover a wound, including stitches (sutures) and adhesive strips. Follow your health care provider's instructions about:  Wound care.  Bandage (dressing) changes and removal.  Wound closure removal.  Check the wound site every day for signs of infection. Watch for:  Red streaks coming from the wound.  Fever.  Redness or tenderness around the wound.  Fluid, blood, or pus coming from the wound.  A bad smell coming from the wound. SEEK MEDICAL CARE IF:  You think that a piece of the sliver is still in your skin.  Your wound was closed, as with sutures, and the edges of the wound  break open.  You have signs of infection, including:  New or worsening redness around the wound.  New or worsening tenderness around the wound.  Fluid, blood, or pus coming from the wound.  A bad smell coming from the wound or dressing. SEEK IMMEDIATE MEDICAL CARE IF: You have any of the following signs of infection:  Red streaks coming from the wound.  An unexplained fever.   This information is not intended to replace advice given to you by your health care provider. Make sure you discuss any questions you have with your health care provider.   Document Released: 05/24/2000 Document Revised: 06/17/2014 Document Reviewed: 01/27/2014 Elsevier Interactive Patient Education [employer].

## 2015-11-24 LAB — COMPLETE METABOLIC PANEL WITH GFR
ALBUMIN: 4.5 g/dL (ref 3.6–5.1)
ALT: 15 U/L (ref 6–29)
AST: 20 U/L (ref 10–35)
Alkaline Phosphatase: 60 U/L (ref 33–130)
BUN: 17 mg/dL (ref 7–25)
CHLORIDE: 100 mmol/L (ref 98–110)
CO2: 27 mmol/L (ref 20–31)
CREATININE: 0.84 mg/dL (ref 0.50–0.99)
Calcium: 9.6 mg/dL (ref 8.6–10.4)
GFR, Est African American: 86 mL/min (ref >=60)
GFR, Est Non African American: 75 mL/min (ref >=60)
GLUCOSE: 90 mg/dL (ref 65–99)
Potassium: 3.5 mmol/L (ref 3.5–5.3)
SODIUM: 141 mmol/L (ref 135–146)
Total Bilirubin: 0.3 mg/dL (ref 0.2–1.2)
Total Protein: 7.6 g/dL (ref 6.1–8.1)

## 2015-11-24 LAB — LIPID PANEL
CHOL/HDL RATIO: 2.7 ratio (ref <=5.0)
Cholesterol: 183 mg/dL (ref 125–200)
HDL: 67 mg/dL (ref >=46)
LDL Cholesterol: 92 mg/dL (ref <130)
Triglycerides: 119 mg/dL (ref <150)
VLDL: 24 mg/dL (ref <30)

## 2015-12-06 MED FILL — POLYETHYLENE GLYCOL 3350: 30 days supply | Qty: 510 | Fill #3

## 2015-12-06 MED FILL — ?DICYCLOMINE 20 MG TABLET: 20 | 30 days supply | Qty: 90 | Fill #5

## 2015-12-06 MED FILL — ?ATORVASTATIN 20 MG TABLET: 20 | 30 days supply | Qty: 30 | Fill #4

## 2015-12-21 MED FILL — AMLODIPINE BESYLATE 10 MG T: 10 | 30 days supply | Qty: 30 | Fill #5

## 2015-12-21 MED FILL — HYDROCHLOROTHIAZIDE 25 MG T: 25 | 30 days supply | Qty: 30 | Fill #5

## 2015-12-21 MED FILL — OMEPRAZOLE DR 40 MG CAPSULE: 40 | 30 days supply | Qty: 30 | Fill #5

## 2016-01-05 MED FILL — ATORVASTATIN 20 MG TABLET: 20 | 30 days supply | Qty: 30 | Fill #5

## 2016-01-05 MED FILL — POLYETHYLENE GLYCOL 3350: 30 days supply | Qty: 510 | Fill #4

## 2016-01-05 MED FILL — ?DICYCLOMINE 20 MG TABLET: 20 | 30 days supply | Qty: 90 | Fill #6

## 2016-01-15 ENCOUNTER — Ambulatory Visit: Payer: Self-pay | Attending: Internal Medicine

## 2016-01-19 MED FILL — ?AMLODIPINE BESYLATE 10 MG: 10 | 30 days supply | Qty: 30 | Fill #6

## 2016-01-19 MED FILL — HYDROCHLOROTHIAZIDE 25 MG T: 25 | 30 days supply | Qty: 30 | Fill #6

## 2016-01-19 MED FILL — OMEPRAZOLE DR 40 MG CAPSULE: 40 | 30 days supply | Qty: 30 | Fill #6

## 2016-01-22 ENCOUNTER — Ambulatory Visit: Payer: Self-pay | Attending: Internal Medicine

## 2016-02-01 MED FILL — POLYETHYLENE GLYCOL 3350: 15 days supply | Qty: 255 | Fill #5

## 2016-02-01 MED FILL — ATORVASTATIN 20 MG TABLET: 20 | 30 days supply | Qty: 30 | Fill #6

## 2016-02-01 MED FILL — ?DICYCLOMINE 20 MG TABLET: 20 | 30 days supply | Qty: 90 | Fill #7

## 2016-02-19 MED FILL — AMLODIPINE BESYLATE 10 MG T: 10 | 30 days supply | Qty: 30 | Fill #7

## 2016-02-19 MED FILL — OMEPRAZOLE DR 40 MG CAPSULE: 40 | 30 days supply | Qty: 30 | Fill #7

## 2016-02-19 MED FILL — HYDROCHLOROTHIAZIDE 25 MG T: 25 | 30 days supply | Qty: 30 | Fill #7

## 2016-03-04 MED FILL — DICYCLOMINE 20 MG TABLET: 20 | 30 days supply | Qty: 90 | Fill #8

## 2016-03-04 MED FILL — ATORVASTATIN 20 MG TABLET: 20 | 30 days supply | Qty: 30 | Fill #7

## 2016-03-05 ENCOUNTER — Other Ambulatory Visit: Payer: Self-pay | Admitting: Pharmacist

## 2016-03-05 ENCOUNTER — Ambulatory Visit (INDEPENDENT_AMBULATORY_CARE_PROVIDER_SITE_OTHER): Payer: Self-pay | Admitting: Family Medicine

## 2016-03-05 VITALS — BP 118/62 | HR 64 | Temp 99.0°F | Resp 16 | Wt 154.4 lb

## 2016-03-05 DIAGNOSIS — N10 Acute tubulo-interstitial nephritis: Secondary | ICD-10-CM

## 2016-03-05 DIAGNOSIS — R109 Unspecified abdominal pain: Secondary | ICD-10-CM

## 2016-03-05 DIAGNOSIS — R059 Cough, unspecified: Secondary | ICD-10-CM

## 2016-03-05 DIAGNOSIS — Z8709 Personal history of other diseases of the respiratory system: Secondary | ICD-10-CM

## 2016-03-05 DIAGNOSIS — R300 Dysuria: Secondary | ICD-10-CM

## 2016-03-05 DIAGNOSIS — R05 Cough: Secondary | ICD-10-CM

## 2016-03-05 DIAGNOSIS — K589 Irritable bowel syndrome without diarrhea: Secondary | ICD-10-CM

## 2016-03-05 LAB — POCT URINALYSIS DIP (MANUAL ENTRY)
BILIRUBIN UA: NEGATIVE
Bilirubin, UA: NEGATIVE
Glucose, UA: NEGATIVE
Nitrite, UA: NEGATIVE
PH UA: 6.5
Protein Ur, POC: NEGATIVE
Spec Grav, UA: <=1.005
Urobilinogen, UA: 0.2

## 2016-03-05 LAB — POCT CBC
Granulocyte percent: 66.3 %G (ref 37–80)
HEMATOCRIT: 39.4 % (ref 37.7–47.9)
Hemoglobin: 13.9 g/dL (ref 12.2–16.2)
LYMPH, POC: 2.3 (ref 0.6–3.4)
MCH, POC: 27.7 pg (ref 27–31.2)
MCHC: 35.2 g/dL (ref 31.8–35.4)
MCV: 78.7 fL — AB (ref 80–97)
MID (cbc): 0.6 (ref 0–0.9)
MPV: 7.8 fL (ref 0–99.8)
POC GRANULOCYTE: 5.8 (ref 2–6.9)
POC LYMPH %: 26.4 % (ref 10–50)
POC MID %: 7.3 % (ref 0–12)
Platelet Count, POC: 157 10*3/uL (ref 142–424)
RBC: 5.01 M/uL (ref 4.04–5.48)
RDW, POC: 13 %
WBC: 8.8 10*3/uL (ref 4.6–10.2)

## 2016-03-05 LAB — POC MICROSCOPIC URINALYSIS (UMFC): MUCUS RE: ABSENT

## 2016-03-05 MED ORDER — LEVOFLOXACIN 750 MG PO TABS: 750.0000 mg | ORAL_TABLET | Freq: Every day | ORAL | 0 refills | Status: DC

## 2016-03-05 MED ORDER — POLYETHYLENE GLYCOL 3350 17 GM/SCOOP PO POWD: 17.0000 g | Freq: Every day | ORAL | 2 refills | Status: DC

## 2016-03-05 MED FILL — POLYETHYLENE GLYCOL 3350: 30 days supply | Qty: 510 | Fill #0

## 2016-03-05 MED FILL — levoFLOXacin 750 MG TABS: 750 | 5 days supply | Qty: 5 | Fill #0

## 2016-03-05 NOTE — Progress Notes (Signed)
u

## 2016-03-05 NOTE — Progress Notes (Signed)
Subjective:  By signing my name below, I, Jacqueline Orozco, attest that this documentation has been prepared under the direction and in the presence of Jacqueline Ray, MD. Electronically Signed: Moises Orozco, [place]. 03/05/2016 , 10:57 AM .  Patient was seen in Room 8 .   Patient ID: Jacqueline Orozco, female    DOB: [birth year], 65 y.o.   MRN: [id number] Chief Complaint  Patient presents with   Urinary Tract Infection    left flank pain x 4days   Cough    x 2 days   HPI Jacqueline Orozco is a 65 y.o. female Here for cough and UTI symptoms. H/o HTN, IBS, HLD, and other medical problems per problem list including seasonal allergies.   Patient states she started having increased urinary frequency 4 days ago with some hematuria. She noticed left flank pain yesterday and it's been radiating down towards her abdomen. She's taken aleve trying to alleviate the pain. She denies history of kidney stones. She informs having pain when taking tylenol. She's only taken OTC medications for pain relief in the past.   She also mentions dry cough that started 2 days ago. When she sleeps, she noticed left ear drainage. She had history of bronchitis in the past. She denies taking any OTC medication for this symptom. She denies fever, rhinorrhea or congestion.   Patient Active Problem List   Diagnosis Date Noted   Pain of right thumb 11/23/2015   Superficial foreign body of right thumb 11/23/2015   Osteoarthritis of right wrist 07/28/2015   Gastric and duodenal angiodysplasia    Seasonal allergies 09/15/2014   IBS (irritable bowel syndrome) 06/20/2014   Lumbar disc disease 09/29/2013   Lower back pain 01/26/2013   Gastric AVM 12/29/2012   Hx of adenomatous colonic polyps 10/16/2012   Plantar fasciitis, right 05/19/2012   Cervical radiculitis 02/12/2012   Vertigo 05/22/2011   HEPATITIS B, CHRONIC 03/28/2010   HLD (hyperlipidemia) 03/28/2010   Essential hypertension 03/28/2010   Past Medical  History:  Diagnosis Date   Adenomatous colon polyp    Allergy    Anemia    Orozco transfusion without reported diagnosis    Cold sore    Fatty liver    Gastric AVM    GERD (gastroesophageal reflux disease)    HEPATITIS B, CHRONIC 03/28/2010   HYPERLIPIDEMIA 03/28/2010   HYPERTENSION 03/28/2010   IBS (irritable bowel syndrome)    Internal hemorrhoids    Lumbar disc disease 09/29/2013   PONV (postoperative nausea and vomiting)    headache also   Past Surgical History:  Procedure Laterality Date   COLONOSCOPY WITH PROPOFOL N/A 01/03/2015   Procedure: COLONOSCOPY WITH PROPOFOL;  Surgeon: Jerene Bears, MD;  Location: [facility];  Service: Gastroenterology;  Laterality: N/A;   ESOPHAGOGASTRODUODENOSCOPY (EGD) WITH PROPOFOL N/A 01/03/2015   Procedure: ESOPHAGOGASTRODUODENOSCOPY (EGD) WITH PROPOFOL;  Surgeon: Jerene Bears, MD;  Location: [facility];  Service: Gastroenterology;  Laterality: N/A;   ESOPHAGOGASTRODUODENOSCOPY ENDOSCOPY     several times   HOT HEMOSTASIS N/A 01/03/2015   Procedure: HOT HEMOSTASIS (ARGON PLASMA COAGULATION/BICAP);  Surgeon: Jerene Bears, MD;  Location: [facility];  Service: Gastroenterology;  Laterality: N/A;   Allergies  Allergen Reactions   Aspirin Other (See Comments)    stomach pain, stomach, bleeding   Penicillins Nausea And Vomiting and Other (See Comments)    Dizzy   Latex Itching   Streptomycin Rash   Tramadol Nausea Only   Prior to Admission medications   Medication Sig Start  Date End Date Taking? Authorizing Provider  amLODipine (NORVASC) 10 MG tablet Take 1 tablet (10 mg total) by mouth daily. 07/28/15  Yes Josalyn Funches, MD  atorvastatin (LIPITOR) 20 MG tablet Take 1 tablet (20 mg total) by mouth daily. 07/28/15  Yes Josalyn Funches, MD  dicyclomine (BENTYL) 20 MG tablet Take 1 tablet (20 mg total) by mouth 3 (three) times daily before meals. 07/28/15  Yes Josalyn Funches, MD  docusate sodium (COLACE) 100 MG capsule  Take 1 capsule (100 mg total) by mouth daily as needed for mild constipation. 07/28/15  Yes Josalyn Funches, MD  hydrochlorothiazide (HYDRODIURIL) 25 MG tablet Take 1 tablet (25 mg total) by mouth daily. 07/28/15  Yes Josalyn Funches, MD  naproxen sodium (ANAPROX) 220 MG tablet Take 220 mg by mouth 2 (two) times daily with a meal.   Yes Historical Provider, MD  omeprazole (PRILOSEC) 40 MG capsule Take 1 capsule (40 mg total) by mouth daily. 07/28/15  Yes Josalyn Funches, MD  polyethylene glycol powder (GLYCOLAX/MIRALAX) powder Take 17 g by mouth daily. 07/28/15  Yes Josalyn Funches, MD  acetaminophen (TYLENOL 8 HOUR) 650 MG CR tablet Take 1 tablet (650 mg total) by mouth every 8 (eight) hours as needed for pain. Patient not taking: Reported on 03/05/2016 07/28/15   Boykin Nearing, MD   Social History   Social History   Marital status: Married    Spouse name: N/A   Number of children: 6   Years of education: 12    Occupational History   Unemployed     Social History Main Topics   Smoking status: Never Smoker   Smokeless tobacco: Never Used   Alcohol use Yes     Comment: occasional wine   Drug use: No   Sexual activity: Yes    Birth control/ protection: None   Other Topics Concern   Not on file   Social History Narrative   From [place].   Lived in [place] since 1994.    Live with husband.   6 adult children.    Speaks some English and reads some  [place].    Review of Systems  Constitutional: Negative for chills, fatigue and fever.  HENT: Positive for ear discharge. Negative for congestion and rhinorrhea.   Respiratory: Positive for cough. Negative for shortness of breath and wheezing.   Gastrointestinal: Positive for abdominal pain. Negative for diarrhea, nausea and vomiting.  Genitourinary: Positive for flank pain, frequency and hematuria.  Musculoskeletal: Negative for back pain.       Objective:   Physical Exam  Constitutional: She is oriented to person, place, and  time. She appears well-developed and well-nourished. No distress.  HENT:  Head: Normocephalic and atraumatic.  Right Ear: Hearing, tympanic membrane, external ear and ear canal normal. No drainage.  Left Ear: Hearing, tympanic membrane, external ear and ear canal normal. No drainage.  Nose: Nose normal.  Mouth/Throat: Oropharynx is clear and moist. No oropharyngeal exudate.  Eyes: Conjunctivae and EOM are normal. Pupils are equal, round, and reactive to light.  Cardiovascular: Normal rate, regular rhythm, normal heart sounds and intact distal pulses.  Exam reveals no gallop and no friction rub.   No murmur heard. Pulmonary/Chest: Effort normal and breath sounds normal. No respiratory distress. She has no wheezes. She has no rhonchi.  Abdominal: There is tenderness in the suprapubic area. There is no CVA tenderness.  Describes left lower flank as area of soreness  Neurological: She is alert and oriented to person, place, and time.  Skin: Skin is warm and dry. No rash noted.  Psychiatric: She has a normal mood and affect. Her behavior is normal.  Vitals reviewed.   Vitals:   03/05/16 1012  BP: 118/62  Pulse: 64  Resp: 16  Temp: 99 F (37.2 C)  TempSrc: Oral  SpO2: 100%  Weight: 154 lb 6.4 oz (70 kg)   Results for orders placed or performed in visit on 03/05/16  POCT urinalysis dipstick  Result Value Ref Range   Color, UA yellow yellow   Clarity, UA clear clear   Glucose, UA negative negative   Bilirubin, UA negative negative   Ketones, POC UA negative negative   Spec Grav, UA <=1.005    Orozco, UA moderate (A) negative   pH, UA 6.5    Protein Ur, POC negative negative   Urobilinogen, UA 0.2    Nitrite, UA Negative Negative   Leukocytes, UA small (1+) (A) Negative  POCT Microscopic Urinalysis ([facility])  Result Value Ref Range   WBC,UR,HPF,POC Moderate (A) None WBC/hpf   RBC,UR,HPF,POC None None RBC/hpf   Bacteria None None, Too numerous to count   Mucus Absent Absent    Epithelial Cells, UR Per Microscopy Few (A) None, Too numerous to count cells/hpf  POCT CBC  Result Value Ref Range   WBC 8.8 4.6 - 10.2 K/uL   Lymph, poc 2.3 0.6 - 3.4   POC LYMPH PERCENT 26.4 10 - 50 %L   MID (cbc) 0.6 0 - 0.9   POC MID % 7.3 0 - 12 %M   POC Granulocyte 5.8 2 - 6.9   Granulocyte percent 66.3 37 - 80 %G   RBC 5.01 4.04 - 5.48 M/uL   Hemoglobin 13.9 12.2 - 16.2 g/dL   HCT, POC 39.4 37.7 - 47.9 %   MCV 78.7 (A) 80 - 97 fL   MCH, POC 27.7 27 - 31.2 pg   MCHC 35.2 31.8 - 35.4 g/dL   RDW, POC 13.0 %   Platelet Count, POC 157 142 - 424 K/uL   MPV 7.8 0 - 99.8 fL      Assessment & Plan:    Jacqueline Orozco is a 65 y.o. female Dysuria - Plan: POCT urinalysis dipstick, POCT Microscopic Urinalysis ([facility]), Urine culture, CANCELED: POCT UA - Microscopic Only, CANCELED: Urinalysis Dipstick  Acute pyelonephritis - Plan: POCT CBC, Urine culture, levofloxacin (LEVAQUIN) 750 MG tablet  Cough  History of allergic rhinitis  Left flank pain - Plan: POCT CBC, Urine culture   Suspected early pyelonephritis after initial urinary tract infection symptoms starting 5 days ago. Initial urinalysis indicating Orozco, so differential of nephrolithiasis, but on micro-appears to be more WBC.   - Start Levaquin 750 mg daily for 5 days. Tendinopathy and rupture risks were discussed. Push fluids, and recheck in 24 hours with Windell Hummingbird, PA-C. Sooner if worse.   Mild cough, possible allergies versus viral illness, lungs clear, over-the-counter treatment with antihistamine discussed, RTC precautions.  Meds ordered this encounter  Medications   naproxen sodium (ANAPROX) 220 MG tablet    Sig: Take 220 mg by mouth 2 (two) times daily with a meal.   levofloxacin (LEVAQUIN) 750 MG tablet    Sig: Take 1 tablet (750 mg total) by mouth daily.    Dispense:  5 tablet    Refill:  0   Patient Instructions   Your lungs sounded clear today, you can try over-the-counter Claritin or Zyrtec for allergies,  mucinex if needed for cough. If any worsening of  cough, return for recheck.  Your flank pain is likely due to a urinary tract infection that has moved to the kidneys or pyelonephritis. Start antibiotics as discussed today, return tomorrow for recheck with Windell Hummingbird.   Ibuprofen or Aleve over-the-counter. Ibuprofen can be taken up to 600 mg every 6 hours as needed with food. Stop that medication if any stomach upset or change in color of stools.   Pyelonephritis, Adult Pyelonephritis is a kidney infection. The kidneys are the organs that filter a person's Orozco and move waste out of the bloodstream and into the urine. Urine passes from the kidneys, through the ureters, and into the bladder. There are two main types of pyelonephritis:  Infections that come on quickly without any warning (acute pyelonephritis).  Infections that last for a long period of time (chronic pyelonephritis). In most cases, the infection clears up with treatment and does not cause further problems. More severe infections or chronic infections can sometimes spread to the bloodstream or lead to other problems with the kidneys. CAUSES This condition is usually caused by:  Bacteria traveling from the bladder to the kidney through infected urine. The urine in the bladder can become infected with bacteria from:  Bladder infection (cystitis).  Inflammation of the prostate gland (prostatitis).  Sexual intercourse, in females.  Bacteria traveling from the bloodstream to the kidney. RISK FACTORS This condition is more likely to develop in:  Pregnant women.  Older people.  People who have diabetes.  People who have kidney stones or bladder stones.  People who have other abnormalities of the kidney or ureter.  People who have a catheter placed in the bladder.  People who have cancer.  People who are sexually active.  Women who use spermicides.  People who have had a prior urinary tract  infection. SYMPTOMS Symptoms of this condition include:  Frequent urination.  Strong or persistent urge to urinate.  Burning or stinging when urinating.  Abdominal pain.  Back pain.  Pain in the side or flank area.  Fever.  Chills.  Orozco in the urine, or dark urine.  Nausea.  Vomiting. DIAGNOSIS This condition may be diagnosed based on:  Medical history and physical exam.  Urine tests.  Orozco tests. You may also have imaging tests of the kidneys, such as an ultrasound or CT scan. TREATMENT Treatment for this condition may depend on the severity of the infection.  If the infection is mild and is found early, you may be treated with antibiotic medicines taken by mouth. You will need to drink fluids to remain hydrated.  If the infection is more severe, you may need to stay in the hospital and receive antibiotics given directly into a vein through an IV tube. You may also need to receive fluids through an IV tube if you are not able to remain hydrated. After your hospital stay, you may need to take oral antibiotics for a period of time. Other treatments may be required, depending on the cause of the infection. HOME CARE INSTRUCTIONS Medicines  Take over-the-counter and prescription medicines only as told by your health care provider.  If you were prescribed an antibiotic medicine, take it as told by your health care provider. Do not stop taking the antibiotic even if you start to feel better. General Instructions  Drink enough fluid to keep your urine clear or pale yellow.  Avoid caffeine, tea, and carbonated beverages. They tend to irritate the bladder.  Urinate often. Avoid holding in urine for long periods of  time.  Urinate before and after sex.  After a bowel movement, women should cleanse from front to back. Use each tissue only once.  Keep all follow-up visits as told by your health care provider. This is important. SEEK MEDICAL CARE IF:  Your symptoms  do not get better after 2 days of treatment.  Your symptoms get worse.  You have a fever. SEEK IMMEDIATE MEDICAL CARE IF:  You are unable to take your antibiotics or fluids.  You have shaking chills.  You vomit.  You have severe flank or back pain.  You have extreme weakness or fainting.   This information is not intended to replace advice given to you by your health care provider. Make sure you discuss any questions you have with your health care provider.   Document Released: 05/27/2005 Document Revised: 02/15/2015 Document Reviewed: 09/19/2014 Elsevier Interactive Patient Education 2016 Elsevier Inc[employer]   Flank Pain Flank pain refers to pain that is located on the side of the body between the upper abdomen and the back. The pain may occur over a short period of time (acute) or may be long-term or reoccurring (chronic). It may be mild or severe. Flank pain can be caused by many things. CAUSES  Some of the more common causes of flank pain include:  Muscle strains.   Muscle spasms.   A disease of your spine (vertebral disk disease).   A lung infection (pneumonia).   Fluid around your lungs (pulmonary edema).   A kidney infection.   Kidney stones.   A very painful skin rash caused by the chickenpox virus (shingles).   Gallbladder disease.  [place] care will depend on the cause of your pain. In general,  Rest as directed by your caregiver.  Drink enough fluids to keep your urine clear or pale yellow.  Only take over-the-counter or prescription medicines as directed by your caregiver. Some medicines may help relieve the pain.  Tell your caregiver about any changes in your pain.  Follow up with your caregiver as directed. SEEK IMMEDIATE MEDICAL CARE IF:   Your pain is not controlled with medicine.   You have new or worsening symptoms.  Your pain increases.   You have abdominal pain.   You have shortness of breath.   You  have persistent nausea or vomiting.   You have swelling in your abdomen.   You feel faint or pass out.   You have Orozco in your urine.  You have a fever or persistent symptoms for more than 2-3 days.  You have a fever and your symptoms suddenly get worse. MAKE SURE YOU:   Understand these instructions.  Will watch your condition.  Will get help right away if you are not doing well or get worse.   This information is not intended to replace advice given to you by your health care provider. Make sure you discuss any questions you have with your health care provider.   Document Released: 07/18/2005 Document Revised: 02/19/2012 Document Reviewed: 01/09/2012 Elsevier Interactive Patient Education 2016 [employer].     IF you received an x-Orozco today, you will receive an invoice from [facility]. Please contact [facility] at [phone] with questions or concerns regarding your invoice.   IF you received labwork today, you will receive an invoice from [employer]. Please contact [facility] at [phone] with questions or concerns regarding your invoice.   Our billing staff will not be able to assist you with questions regarding  bills from these companies.  You will be contacted with the lab results as soon as they are available. The fastest way to get your results is to activate your My Chart account. Instructions are located on the last page of this paperwork. If you have not heard from [place] regarding the results in 2 weeks, please contact this office.        I personally performed the services described in this documentation, which was scribed in my presence. The recorded information has been reviewed and considered, and addended by me as needed.   Signed,   Jacqueline Ray, MD [facility].  03/05/16 11:21 AM

## 2016-03-05 NOTE — Patient Instructions (Addendum)
Your lungs sounded clear today, you can try over-the-counter Claritin or Zyrtec for allergies, mucinex if needed for cough. If any worsening of cough, return for recheck.  Your flank pain is likely due to a urinary tract infection that has moved to the kidneys or pyelonephritis. Start antibiotics as discussed today, return tomorrow for recheck with Windell Hummingbird.   Ibuprofen or Aleve over-the-counter. Ibuprofen can be taken up to 600 mg every 6 hours as needed with food. Stop that medication if any stomach upset or change in color of stools.   Pyelonephritis, Adult Pyelonephritis is a kidney infection. The kidneys are the organs that filter a person's blood and move waste out of the bloodstream and into the urine. Urine passes from the kidneys, through the ureters, and into the bladder. There are two main types of pyelonephritis:  Infections that come on quickly without any warning (acute pyelonephritis).  Infections that last for a long period of time (chronic pyelonephritis). In most cases, the infection clears up with treatment and does not cause further problems. More severe infections or chronic infections can sometimes spread to the bloodstream or lead to other problems with the kidneys. CAUSES This condition is usually caused by:  Bacteria traveling from the bladder to the kidney through infected urine. The urine in the bladder can become infected with bacteria from:  Bladder infection (cystitis).  Inflammation of the prostate gland (prostatitis).  Sexual intercourse, in females.  Bacteria traveling from the bloodstream to the kidney. RISK FACTORS This condition is more likely to develop in:  Pregnant women.  Older people.  People who have diabetes.  People who have kidney stones or bladder stones.  People who have other abnormalities of the kidney or ureter.  People who have a catheter placed in the bladder.  People who have cancer.  People who are sexually  active.  Women who use spermicides.  People who have had a prior urinary tract infection. SYMPTOMS Symptoms of this condition include:  Frequent urination.  Strong or persistent urge to urinate.  Burning or stinging when urinating.  Abdominal pain.  Back pain.  Pain in the side or flank area.  Fever.  Chills.  Blood in the urine, or dark urine.  Nausea.  Vomiting. DIAGNOSIS This condition may be diagnosed based on:  Medical history and physical exam.  Urine tests.  Blood tests. You may also have imaging tests of the kidneys, such as an ultrasound or CT scan. TREATMENT Treatment for this condition may depend on the severity of the infection.  If the infection is mild and is found early, you may be treated with antibiotic medicines taken by mouth. You will need to drink fluids to remain hydrated.  If the infection is more severe, you may need to stay in the hospital and receive antibiotics given directly into a vein through an IV tube. You may also need to receive fluids through an IV tube if you are not able to remain hydrated. After your hospital stay, you may need to take oral antibiotics for a period of time. Other treatments may be required, depending on the cause of the infection. HOME CARE INSTRUCTIONS Medicines  Take over-the-counter and prescription medicines only as told by your health care provider.  If you were prescribed an antibiotic medicine, take it as told by your health care provider. Do not stop taking the antibiotic even if you start to feel better. General Instructions  Drink enough fluid to keep your urine clear or pale yellow.  Avoid caffeine, tea, and carbonated beverages. They tend to irritate the bladder.  Urinate often. Avoid holding in urine for long periods of time.  Urinate before and after sex.  After a bowel movement, women should cleanse from front to back. Use each tissue only once.  Keep all follow-up visits as told by  your health care provider. This is important. SEEK MEDICAL CARE IF:  Your symptoms do not get better after 2 days of treatment.  Your symptoms get worse.  You have a fever. SEEK IMMEDIATE MEDICAL CARE IF:  You are unable to take your antibiotics or fluids.  You have shaking chills.  You vomit.  You have severe flank or back pain.  You have extreme weakness or fainting.   This information is not intended to replace advice given to you by your health care provider. Make sure you discuss any questions you have with your health care provider.   Document Released: 05/27/2005 Document Revised: 02/15/2015 Document Reviewed: 09/19/2014 Elsevier Interactive Patient Education 2016 Elsevier Inc.   Flank Pain Flank pain refers to pain that is located on the side of the body between the upper abdomen and the back. The pain may occur over a short period of time (acute) or may be long-term or reoccurring (chronic). It may be mild or severe. Flank pain can be caused by many things. CAUSES  Some of the more common causes of flank pain include:  Muscle strains.   Muscle spasms.   A disease of your spine (vertebral disk disease).   A lung infection (pneumonia).   Fluid around your lungs (pulmonary edema).   A kidney infection.   Kidney stones.   A very painful skin rash caused by the chickenpox virus (shingles).   Gallbladder disease.  [place] care will depend on the cause of your pain. In general,  Rest as directed by your caregiver.  Drink enough fluids to keep your urine clear or pale yellow.  Only take over-the-counter or prescription medicines as directed by your caregiver. Some medicines may help relieve the pain.  Tell your caregiver about any changes in your pain.  Follow up with your caregiver as directed. SEEK IMMEDIATE MEDICAL CARE IF:   Your pain is not controlled with medicine.   You have new or worsening symptoms.  Your pain  increases.   You have abdominal pain.   You have shortness of breath.   You have persistent nausea or vomiting.   You have swelling in your abdomen.   You feel faint or pass out.   You have blood in your urine.  You have a fever or persistent symptoms for more than 2-3 days.  You have a fever and your symptoms suddenly get worse. MAKE SURE YOU:   Understand these instructions.  Will watch your condition.  Will get help right away if you are not doing well or get worse.   This information is not intended to replace advice given to you by your health care provider. Make sure you discuss any questions you have with your health care provider.   Document Released: 07/18/2005 Document Revised: 02/19/2012 Document Reviewed: 01/09/2012 Elsevier Interactive Patient Education 2016 [employer].     IF you received an x-ray today, you will receive an invoice from [facility]. Please contact [facility] at [phone] with questions or concerns regarding your invoice.   IF you received labwork today, you will receive an invoice from [employer]. Please contact [facility] at  [phone] with questions or concerns regarding your invoice.   Our billing staff will not be able to assist you with questions regarding bills from these companies.  You will be contacted with the lab results as soon as they are available. The fastest way to get your results is to activate your My Chart account. Instructions are located on the last page of this paperwork. If you have not heard from [place] regarding the results in 2 weeks, please contact this office.

## 2016-03-06 ENCOUNTER — Ambulatory Visit (INDEPENDENT_AMBULATORY_CARE_PROVIDER_SITE_OTHER): Payer: Self-pay | Admitting: Physician Assistant

## 2016-03-06 ENCOUNTER — Encounter: Payer: Self-pay | Admitting: Physician Assistant

## 2016-03-06 VITALS — BP 124/82 | HR 75 | Temp 99.3°F | Resp 17 | Ht 63.0 in | Wt 153.0 lb

## 2016-03-06 DIAGNOSIS — R300 Dysuria: Secondary | ICD-10-CM

## 2016-03-06 DIAGNOSIS — N10 Acute tubulo-interstitial nephritis: Secondary | ICD-10-CM

## 2016-03-06 NOTE — Patient Instructions (Addendum)
  Drink a lot of water to help the antibiotic gets into your kidney and bladder.  Take all the antibiotics.  Recheck if having problems   IF you received an x-ray today, you will receive an invoice from [facility]. Please contact [facility] at [phone] with questions or concerns regarding your invoice.   IF you received labwork today, you will receive an invoice from [employer]. Please contact [facility] at [phone] with questions or concerns regarding your invoice.   Our billing staff will not be able to assist you with questions regarding bills from these companies.  You will be contacted with the lab results as soon as they are available. The fastest way to get your results is to activate your My Chart account. Instructions are located on the last page of this paperwork. If you have not heard from [place] regarding the results in 2 weeks, please contact this office.

## 2016-03-06 NOTE — Progress Notes (Signed)
Subjective:    Patient ID: Jacqueline Orozco, female    DOB: January 20, 1951, 65 y.o.   MRN: [id number] Chief Complaint  Patient presents with  . Follow-up    Previously dx with kidney infection. Still c/o dysuria.     HPI Patients presents for follow-up of early pyelonephritis. On 03/05/16 patient saw Dr. Nyoka Cowden for Dysuria. Patient was diagnosed with early Pyelonephritis and concomitant URI and was prescribed Levoquin as well as Neproxen for pain. Upon discharge she was told to return the following day for a follow-up.  Today patient reports symptoms have decreased in severity but have not completely resolved. Reports she is still having frequency of urination, urgency, burning sensation, left flank pain and slight HA. Denies fever, chills, diaphoresis, and hematuria.   Past Medical History:  Diagnosis Date  . Adenomatous colon polyp   . Allergy   . Anemia   . Blood transfusion without reported diagnosis   . Cold sore   . Fatty liver   . Gastric AVM   . GERD (gastroesophageal reflux disease)   . HEPATITIS B, CHRONIC 03/28/2010  . HYPERLIPIDEMIA 03/28/2010  . HYPERTENSION 03/28/2010  . IBS (irritable bowel syndrome)   . Internal hemorrhoids   . Lumbar disc disease 09/29/2013  . PONV (postoperative nausea and vomiting)    headache also   Social History   Social History  . Marital status: Married    Spouse name: N/A  . Number of children: 6  . Years of education: 51    Occupational History  . Unemployed     Social History Main Topics  . Smoking status: Never Smoker  . Smokeless tobacco: Never Used  . Alcohol use Yes     Comment: occasional wine  . Drug use: No  . Sexual activity: Yes    Birth control/ protection: None   Other Topics Concern  . Not on file   Social History Narrative   From [place].   Lived in [place] since 1994.    Live with husband.   6 adult children.    Speaks some English and reads some  [place].    Current Outpatient Prescriptions on File Prior to Visit    Medication Sig Dispense Refill  . acetaminophen (TYLENOL 8 HOUR) 650 MG CR tablet Take 1 tablet (650 mg total) by mouth every 8 (eight) hours as needed for pain. 90 tablet 1  . amLODipine (NORVASC) 10 MG tablet Take 1 tablet (10 mg total) by mouth daily. 90 tablet 3  . atorvastatin (LIPITOR) 20 MG tablet Take 1 tablet (20 mg total) by mouth daily. 90 tablet 3  . dicyclomine (BENTYL) 20 MG tablet Take 1 tablet (20 mg total) by mouth 3 (three) times daily before meals. 90 tablet 11  . docusate sodium (COLACE) 100 MG capsule Take 1 capsule (100 mg total) by mouth daily as needed for mild constipation. 90 capsule 1  . hydrochlorothiazide (HYDRODIURIL) 25 MG tablet Take 1 tablet (25 mg total) by mouth daily. 90 tablet 3  . levofloxacin (LEVAQUIN) 750 MG tablet Take 1 tablet (750 mg total) by mouth daily. 5 tablet 0  . naproxen sodium (ANAPROX) 220 MG tablet Take 220 mg by mouth 2 (two) times daily with a meal.    . omeprazole (PRILOSEC) 40 MG capsule Take 1 capsule (40 mg total) by mouth daily. 90 capsule 3  . polyethylene glycol powder (GLYCOLAX/MIRALAX) powder Take 17 g by mouth daily. 714 g 2   No current facility-administered medications on file prior  to visit.      Review of Systems All review of systems are negative except those stated above     Objective:   Physical Exam  Constitutional: She appears well-developed and well-nourished. No distress.  HENT:  Head: Normocephalic and atraumatic.  Right Ear: External ear normal.  Left Ear: External ear normal.  Mouth/Throat: No oropharyngeal exudate.  Left eustation tube dysfunction   Eyes: Conjunctivae are normal. Pupils are equal, round, and reactive to light. Right eye exhibits no discharge. Left eye exhibits no discharge.  Neck: Neck supple.  Cardiovascular: Normal rate, regular rhythm, normal heart sounds and intact distal pulses.  Exam reveals no gallop and no friction rub.   No murmur heard. Pulmonary/Chest: Breath sounds normal.  No respiratory distress. She has no wheezes.  Abdominal: There is tenderness (LLQ as well as left flank ).  Lymphadenopathy:    She has no cervical adenopathy.  Skin: She is not diaphoretic.  BP 124/82 (BP Location: Right Arm, Patient Position: Sitting, Cuff Size: Normal)   Pulse 75   Temp 99.3 F (37.4 C) (Oral)   Resp 17   Ht 5\' 3"  (1.6 m)   Wt 153 lb (69.4 kg)   SpO2 99%   BMI 27.10 kg/m       Assessment & Plan:  1. Acute pyelonephritis Symptoms have improved but not yet resolved, will continue current treatment with Levoquin  2. Burning with urination Most likely due to infectious process, will resolve with ABX

## 2016-03-06 NOTE — Progress Notes (Signed)
Jacqueline Orozco  MRN: [id number] DOB: 27-Apr-1951  Subjective:  Pt presents to clinic for a recheck.  She is feeling much better - her back pain has decreased but not resolved - she is still having some dysuria and urinary frequency but she feels better.  She got the abx and is tolerating it ok - she has not gotten the naproxen but has been taking aleve which has been helping with the pain.    Review of Systems  Constitutional: Negative for chills and fever.  Genitourinary: Positive for dysuria, frequency and urgency (mild).  Musculoskeletal: Negative for back pain.    Patient Active Problem List   Diagnosis Date Noted  . Pain of right thumb 11/23/2015  . Superficial foreign body of right thumb 11/23/2015  . Osteoarthritis of right wrist 07/28/2015  . Gastric and duodenal angiodysplasia   . Seasonal allergies 09/15/2014  . IBS (irritable bowel syndrome) 06/20/2014  . Lumbar disc disease 09/29/2013  . Lower back pain 01/26/2013  . Gastric AVM 12/29/2012  . Hx of adenomatous colonic polyps 10/16/2012  . Plantar fasciitis, right 05/19/2012  . Cervical radiculitis 02/12/2012  . Vertigo 05/22/2011  . HEPATITIS B, CHRONIC 03/28/2010  . HLD (hyperlipidemia) 03/28/2010  . Essential hypertension 03/28/2010    Current Outpatient Prescriptions on File Prior to Visit  Medication Sig Dispense Refill  . acetaminophen (TYLENOL 8 HOUR) 650 MG CR tablet Take 1 tablet (650 mg total) by mouth every 8 (eight) hours as needed for pain. 90 tablet 1  . amLODipine (NORVASC) 10 MG tablet Take 1 tablet (10 mg total) by mouth daily. 90 tablet 3  . atorvastatin (LIPITOR) 20 MG tablet Take 1 tablet (20 mg total) by mouth daily. 90 tablet 3  . dicyclomine (BENTYL) 20 MG tablet Take 1 tablet (20 mg total) by mouth 3 (three) times daily before meals. 90 tablet 11  . docusate sodium (COLACE) 100 MG capsule Take 1 capsule (100 mg total) by mouth daily as needed for mild constipation. 90 capsule 1  .  hydrochlorothiazide (HYDRODIURIL) 25 MG tablet Take 1 tablet (25 mg total) by mouth daily. 90 tablet 3  . levofloxacin (LEVAQUIN) 750 MG tablet Take 1 tablet (750 mg total) by mouth daily. 5 tablet 0  . naproxen sodium (ANAPROX) 220 MG tablet Take 220 mg by mouth 2 (two) times daily with a meal.    . omeprazole (PRILOSEC) 40 MG capsule Take 1 capsule (40 mg total) by mouth daily. 90 capsule 3  . polyethylene glycol powder (GLYCOLAX/MIRALAX) powder Take 17 g by mouth daily. 714 g 2   No current facility-administered medications on file prior to visit.     Allergies  Allergen Reactions  . Aspirin Other (See Comments)    stomach pain, stomach, bleeding  . Penicillins Nausea And Vomiting and Other (See Comments)    Dizzy  . Latex Itching  . Streptomycin Rash  . Tramadol Nausea Only    Pt patients past, family and social history were reviewed and updated.  Objective:  BP 124/82 (BP Location: Right Arm, Patient Position: Sitting, Cuff Size: Normal)   Pulse 75   Temp 99.3 F (37.4 C) (Oral)   Resp 17   Ht 5\' 3"  (1.6 m)   Wt 153 lb (69.4 kg)   SpO2 99%   BMI 27.10 kg/m   Physical Exam  Constitutional: She is oriented to person, place, and time and well-developed, well-nourished, and in no distress.  HENT:  Head: Normocephalic and atraumatic.  Right Ear:  External ear normal.  Left Ear: External ear normal.  Cardiovascular: Normal rate, regular rhythm and normal heart sounds.   No murmur heard. Pulmonary/Chest: Effort normal and breath sounds normal.  Abdominal: Soft. There is no tenderness. There is no CVA tenderness.  Neurological: She is alert and oriented to person, place, and time. Gait normal.  Skin: Skin is warm and dry.  Psychiatric: Mood, memory, affect and judgment normal.  Vitals reviewed.   Assessment and Plan :  Acute pyelonephritis  Burning with urination   Continue abx - no labs done today as she is feeling better - she will finish her abx and continue to  push fluids and she will take aleve as needed.  She will f/u in 48h if she stops improving - we will contact her with her urine culture results.  Windell Hummingbird PA-C  [facility] 03/06/2016 12:17 PM

## 2016-03-07 LAB — URINE CULTURE

## 2016-03-11 ENCOUNTER — Ambulatory Visit (HOSPITAL_COMMUNITY)
Admission: RE | Admit: 2016-03-11 | Discharge: 2016-03-11 | Disposition: A | Discharge status: Home / Self Care | Payer: Self-pay | Source: Ambulatory Visit | Attending: Family Medicine | Admitting: Family Medicine

## 2016-03-11 ENCOUNTER — Ambulatory Visit: Payer: Self-pay | Attending: Family Medicine | Admitting: Family Medicine

## 2016-03-11 ENCOUNTER — Encounter: Payer: Self-pay | Admitting: Family Medicine

## 2016-03-11 VITALS — BP 126/83 | HR 67 | Temp 98.5°F | Ht 63.0 in | Wt 156.4 lb

## 2016-03-11 DIAGNOSIS — M79644 Pain in right finger(s): Secondary | ICD-10-CM | POA: Insufficient documentation

## 2016-03-11 DIAGNOSIS — Z23 Encounter for immunization: Secondary | ICD-10-CM

## 2016-03-11 DIAGNOSIS — N10 Acute tubulo-interstitial nephritis: Secondary | ICD-10-CM | POA: Insufficient documentation

## 2016-03-11 DIAGNOSIS — M795 Residual foreign body in soft tissue: Secondary | ICD-10-CM | POA: Insufficient documentation

## 2016-03-11 DIAGNOSIS — Z79899 Other long term (current) drug therapy: Secondary | ICD-10-CM | POA: Insufficient documentation

## 2016-03-11 DIAGNOSIS — K581 Irritable bowel syndrome with constipation: Secondary | ICD-10-CM | POA: Insufficient documentation

## 2016-03-11 LAB — POCT URINALYSIS DIPSTICK
BILIRUBIN UA: NEGATIVE
Blood, UA: NEGATIVE
GLUCOSE UA: NEGATIVE
Ketones, UA: NEGATIVE
LEUKOCYTES UA: NEGATIVE
NITRITE UA: NEGATIVE
PH UA: 7.5
Protein, UA: NEGATIVE
Spec Grav, UA: 1.015
Urobilinogen, UA: 0.2

## 2016-03-11 MED ORDER — POLYETHYLENE GLYCOL 3350 17 GM/SCOOP PO POWD: 17.0000 g | Freq: Every day | ORAL | 11 refills | Status: DC

## 2016-03-11 NOTE — Progress Notes (Signed)
Right hand pain.  Pt states that pain goes all the  Way up her arm, pt has been using biofreeze.  Pt is getting flu shot today.

## 2016-03-11 NOTE — Patient Instructions (Addendum)
Jacqueline Orozco was seen today for hand pain.  Diagnoses and all orders for this visit:  Thumb pain, right -     DG Finger Thumb Right; Future -     Ambulatory referral to General Surgery  Acute pyelonephritis -     POCT urinalysis dipstick  Irritable bowel syndrome with constipation -     polyethylene glycol powder (GLYCOLAX/MIRALAX) powder; Take 17 g by mouth daily.    You will be called with surgery appointment Please go to radiology for x-ray of thumb   F/u in 3 months for HTN  Dr. Adrian Blackwater

## 2016-03-11 NOTE — Progress Notes (Signed)
Subjective:  Patient ID: Jacqueline Orozco, female    DOB: Nov 04, 1950  Age: 65 y.o. MRN: [id number]  CC: Hand Pain   HPI Jacqueline Orozco presents for   1. R thumb pain: x 4 months.  She was seen my me 3 months ago after 1 month of R tip of thumb pain. She believed she has a rose thorn in her finger tip. There was no redness or swelling. There is a puncture site that has not changed. Pain is 6-7/10.  Attempt at incision and debridement was done with no foreign material found. She reports persistent pain, there is a thick white spot on tip of thumb that is tender. No swelling, redness or drainage.   She does have pain and intermittent swelling of R 2nd MCP joint.   2. Urgent care f/u acute pyelonephritis; she was seen in urgent care on 03/05/2016 for dysuria, nausea and LL back pain radiating to groin. She was diagnosed with early pyelonephritis. She was prescribed a course of leavaquin which she completed today. UCX was positive for E. Coli sensitive to levaquin. She still has some L low back pain and L lower stomach pain. She denies fever, chills, nausea and dysuria.    Social History  Substance Use Topics  . Smoking status: Never Smoker  . Smokeless tobacco: Never Used  . Alcohol use Yes     Comment: occasional wine   Outpatient Medications Prior to Visit  Medication Sig Dispense Refill  . acetaminophen (TYLENOL 8 HOUR) 650 MG CR tablet Take 1 tablet (650 mg total) by mouth every 8 (eight) hours as needed for pain. 90 tablet 1  . amLODipine (NORVASC) 10 MG tablet Take 1 tablet (10 mg total) by mouth daily. 90 tablet 3  . atorvastatin (LIPITOR) 20 MG tablet Take 1 tablet (20 mg total) by mouth daily. 90 tablet 3  . dicyclomine (BENTYL) 20 MG tablet Take 1 tablet (20 mg total) by mouth 3 (three) times daily before meals. 90 tablet 11  . docusate sodium (COLACE) 100 MG capsule Take 1 capsule (100 mg total) by mouth daily as needed for mild constipation. 90 capsule 1  . hydrochlorothiazide (HYDRODIURIL)  25 MG tablet Take 1 tablet (25 mg total) by mouth daily. 90 tablet 3  . levofloxacin (LEVAQUIN) 750 MG tablet Take 1 tablet (750 mg total) by mouth daily. 5 tablet 0  . naproxen sodium (ANAPROX) 220 MG tablet Take 220 mg by mouth 2 (two) times daily with a meal.    . omeprazole (PRILOSEC) 40 MG capsule Take 1 capsule (40 mg total) by mouth daily. 90 capsule 3  . polyethylene glycol powder (GLYCOLAX/MIRALAX) powder Take 17 g by mouth daily. 714 g 2   No facility-administered medications prior to visit.     ROS Review of Systems  Constitutional: Negative for chills and fever.  Eyes: Negative for visual disturbance.  Respiratory: Negative for shortness of breath.   Cardiovascular: Negative for chest pain.  Gastrointestinal: Positive for abdominal pain. Negative for blood in stool.  Musculoskeletal: Positive for arthralgias and back pain.  Skin: Negative for rash.  Allergic/Immunologic: Negative for immunocompromised state.  Neurological: Negative for dizziness and headaches.  Hematological: Negative for adenopathy. Does not bruise/bleed easily.  Psychiatric/Behavioral: Negative for dysphoric mood and suicidal ideas.    Objective:  BP 126/83 (BP Location: Left Arm, Patient Position: Sitting, Cuff Size: Small)   Pulse 67   Temp 98.5 F (36.9 C) (Oral)   Ht 5\' 3"  (1.6 m)  Wt 156 lb 6.4 oz (70.9 kg)   SpO2 99%   BMI 27.71 kg/m   BP/Weight 03/11/2016 03/06/2016 [id number]  Systolic BP 123XX123 [id number]  Diastolic BP 83 82 62  Wt. (Lbs) 156.4 153 154.4  BMI 27.71 27.1 27.35   Physical Exam  Constitutional: She is oriented to person, place, and time. She appears well-developed and well-nourished. No distress.  HENT:  Head: Normocephalic and atraumatic.  Cardiovascular: Normal rate, regular rhythm, normal heart sounds and intact distal pulses.   Pulmonary/Chest: Effort normal and breath sounds normal.  Abdominal: Soft. Bowel sounds are normal. She exhibits no distension and no mass. There  is tenderness in the left lower quadrant. There is no rebound, no guarding and no CVA tenderness.  Musculoskeletal: She exhibits no edema.       Hands: Neurological: She is alert and oriented to person, place, and time.  Skin: Skin is warm and dry. No rash noted.  Psychiatric: She has a normal mood and affect.   UA: wnl   Assessment & Plan:   Jacqueline Orozco was seen today for hand pain.  Diagnoses and all orders for this visit:  Thumb pain, right -     DG Finger Thumb Right; Future -     Ambulatory referral to General Surgery  Acute pyelonephritis -     POCT urinalysis dipstick  Irritable bowel syndrome with constipation -     polyethylene glycol powder (GLYCOLAX/MIRALAX) powder; Take 17 g by mouth daily.  Encounter for immunization -     Flu Vaccine QUAD 36+ mos IM    No orders of the defined types were placed in this encounter.   Follow-up: Return in about 3 months (around 06/11/2016) for HTN .   Boykin Nearing MD

## 2016-03-11 NOTE — Assessment & Plan Note (Signed)
R thumb pain x 4 months X-ray with small foreign body gen surg referral for removal

## 2016-03-18 MED FILL — HYDROCHLOROTHIAZIDE 25 MG T: 25 | 30 days supply | Qty: 30 | Fill #8

## 2016-03-18 MED FILL — AMLODIPINE BESYLATE 10 MG T: 10 | 30 days supply | Qty: 30 | Fill #8

## 2016-03-18 MED FILL — OMEPRAZOLE DR 40 MG CAPSULE: 40 | 30 days supply | Qty: 30 | Fill #8

## 2016-04-02 MED FILL — DICYCLOMINE 20 MG TABLET: 20 | 30 days supply | Qty: 90 | Fill #9

## 2016-04-02 MED FILL — ATORVASTATIN 20 MG TABLET: 20 | 30 days supply | Qty: 30 | Fill #8

## 2016-04-03 ENCOUNTER — Other Ambulatory Visit: Payer: Self-pay | Admitting: Pharmacist

## 2016-04-03 DIAGNOSIS — K581 Irritable bowel syndrome with constipation: Secondary | ICD-10-CM

## 2016-04-03 MED ORDER — POLYETHYLENE GLYCOL 3350 17 GM/SCOOP PO POWD: 17.0000 g | Freq: Every day | ORAL | 11 refills | Status: DC

## 2016-04-03 MED FILL — POLYETHYLENE GLYCOL 3350: 20 days supply | Qty: 510 | Fill #0

## 2016-04-08 ENCOUNTER — Encounter: Payer: Self-pay | Admitting: Family Medicine

## 2016-04-08 ENCOUNTER — Ambulatory Visit: Payer: Self-pay | Attending: Family Medicine | Admitting: Family Medicine

## 2016-04-08 ENCOUNTER — Other Ambulatory Visit: Payer: Self-pay

## 2016-04-08 VITALS — BP 135/83 | HR 62 | Temp 98.5°F | Ht 63.0 in | Wt 155.8 lb

## 2016-04-08 DIAGNOSIS — Z880 Allergy status to penicillin: Secondary | ICD-10-CM | POA: Insufficient documentation

## 2016-04-08 DIAGNOSIS — S60351S Superficial foreign body of right thumb, sequela: Secondary | ICD-10-CM

## 2016-04-08 DIAGNOSIS — R109 Unspecified abdominal pain: Secondary | ICD-10-CM

## 2016-04-08 DIAGNOSIS — S60351A Superficial foreign body of right thumb, initial encounter: Secondary | ICD-10-CM | POA: Insufficient documentation

## 2016-04-08 DIAGNOSIS — Z01818 Encounter for other preprocedural examination: Secondary | ICD-10-CM

## 2016-04-08 LAB — POCT URINALYSIS DIPSTICK
Bilirubin, UA: NEGATIVE
Glucose, UA: NEGATIVE
Ketones, UA: NEGATIVE
Nitrite, UA: NEGATIVE
Protein, UA: NEGATIVE
Spec Grav, UA: <=1.005
Urobilinogen, UA: 0.2
pH, UA: 6.5

## 2016-04-08 MED ORDER — CIPROFLOXACIN HCL 500 MG PO TABS: 500.0000 mg | ORAL_TABLET | Freq: Two times a day (BID) | ORAL | 0 refills | Status: DC

## 2016-04-08 MED FILL — ?CIPROFLOXACIN HCL 500MG TA: 500 | 10 days supply | Qty: 20 | Fill #0

## 2016-04-08 NOTE — Patient Instructions (Signed)
Shirleymae was seen today for follow-up.  Diagnoses and all orders for this visit:  Left flank pain -     POCT urinalysis dipstick -     Urine culture -     Cancel: CT RENAL ABD W/WO; Future -     CT Abdomen Pelvis W Contrast; Future -     ciprofloxacin (CIPRO) 500 MG tablet; Take 1 tablet (500 mg total) by mouth 2 (two) times daily.  Pre-op evaluation -     EKG 12-Lead   I will ask ortho to call you back  F/u with me in 1 week for L flank pain   Dr. Adrian Blackwater

## 2016-04-08 NOTE — Progress Notes (Signed)
Pt is here today for a medical clearance for thumb surgery.  Pt also states that she is having blood in her urine and pain in her kidney area.

## 2016-04-08 NOTE — Progress Notes (Signed)
Subjective:  Patient ID: Jacqueline Orozco, female    DOB: 07-Nov-1950  Age: 65 y.o. MRN: [id number]  CC: Follow-up (clearence for thumb surgery.)   HPI Jacqueline Orozco presents for   1. R thumb surgery: she has a foreign body in her R thumb. Removal in office was unsuccessful. She was referred to general surgery for removal. She presents today for surgical clearance.  She has not yet been to the surgeons office. Upon review of her chart attempts were made to call her and schedule from 10/10-10/12/17. She reports that her phone is off and has updated her best contact number in her chart. The referral has been closed.   2. L flank pain: pain in L flank x 2 days. She has also noticed blood in her urine. On 03/05/2016 she had similar symptoms and was treated for early L sided pyelonephritis with Levaquin x 5 day course at urgent care . Urine Cx was positive for 50-100 K E. Coli sensitive to fluoroquinolone. Resistant to  Macrobid. She has PCN allergy.  She completed the  course of antibiotics. She denies fever and chills.   Social History  Substance Use Topics  . Smoking status: Never Smoker  . Smokeless tobacco: Never Used  . Alcohol use Yes     Comment: occasional wine    Outpatient Medications Prior to Visit  Medication Sig Dispense Refill  . acetaminophen (TYLENOL 8 HOUR) 650 MG CR tablet Take 1 tablet (650 mg total) by mouth every 8 (eight) hours as needed for pain. 90 tablet 1  . amLODipine (NORVASC) 10 MG tablet Take 1 tablet (10 mg total) by mouth daily. 90 tablet 3  . atorvastatin (LIPITOR) 20 MG tablet Take 1 tablet (20 mg total) by mouth daily. 90 tablet 3  . dicyclomine (BENTYL) 20 MG tablet Take 1 tablet (20 mg total) by mouth 3 (three) times daily before meals. 90 tablet 11  . docusate sodium (COLACE) 100 MG capsule Take 1 capsule (100 mg total) by mouth daily as needed for mild constipation. 90 capsule 1  . hydrochlorothiazide (HYDRODIURIL) 25 MG tablet Take 1 tablet (25 mg total) by mouth  daily. 90 tablet 3  . naproxen sodium (ANAPROX) 220 MG tablet Take 220 mg by mouth 2 (two) times daily with a meal.    . omeprazole (PRILOSEC) 40 MG capsule Take 1 capsule (40 mg total) by mouth daily. 90 capsule 3  . polyethylene glycol powder (GLYCOLAX/MIRALAX) powder Take 17 g by mouth daily. 714 g 11   No facility-administered medications prior to visit.     ROS Review of Systems  Constitutional: Negative for chills and fever.  Eyes: Negative for visual disturbance.  Respiratory: Negative for shortness of breath.   Cardiovascular: Negative for chest pain.  Gastrointestinal: Positive for abdominal pain. Negative for blood in stool.  Musculoskeletal: Positive for arthralgias and back pain.  Skin: Negative for rash.  Allergic/Immunologic: Negative for immunocompromised state.  Neurological: Negative for dizziness and headaches.  Hematological: Negative for adenopathy. Does not bruise/bleed easily.  Psychiatric/Behavioral: Negative for dysphoric mood and suicidal ideas.    Objective:  BP 135/83 (BP Location: Right Arm, Patient Position: Sitting, Cuff Size: Small)   Pulse 62   Temp 98.5 F (36.9 C) (Oral)   Ht 5\' 3"  (1.6 m)   Wt 155 lb 12.8 oz (70.7 kg)   SpO2 96%   BMI 27.60 kg/m   BP/Weight 04/08/2016 03/11/2016 [id number]  Systolic BP A999333 [id number] A999333  Diastolic BP 83 83 82  Wt. (Lbs) 155.8 156.4 153  BMI 27.6 27.71 27.1    Physical Exam  Constitutional: She is oriented to person, place, and time. She appears well-developed and well-nourished. No distress.  HENT:  Head: Normocephalic and atraumatic.  Cardiovascular: Normal rate, regular rhythm, normal heart sounds and intact distal pulses.   Pulmonary/Chest: Effort normal and breath sounds normal.  Abdominal: Soft. Bowel sounds are normal. She exhibits no distension and no mass. There is tenderness in the left lower quadrant. There is no rebound, no guarding and no CVA tenderness.  Musculoskeletal: She exhibits no edema.        Hands: Neurological: She is alert and oriented to person, place, and time.  Skin: Skin is warm and dry. No rash noted.  Psychiatric: She has a normal mood and affect.   UA: clear, small LE, trace RBC  EKG: normal EKG, normal sinus rhythm. Assessment & Plan:   Jacqueline Orozco was seen today for follow-up.  Diagnoses and all orders for this visit:  Left flank pain -     POCT urinalysis dipstick -     Urine culture -     Cancel: CT RENAL ABD W/WO; Future -     CT Abdomen Pelvis W Contrast; Future -     ciprofloxacin (CIPRO) 500 MG tablet; Take 1 tablet (500 mg total) by mouth 2 (two) times daily.  Pre-op evaluation -     EKG 12-Lead  Foreign body of right thumb, sequela -     Ambulatory referral to Orthopedic Surgery    No orders of the defined types were placed in this encounter.   Follow-up: Return in about 1 week (around 04/15/2016) for L flank pain .   Boykin Nearing MD

## 2016-04-10 LAB — URINE CULTURE

## 2016-04-12 ENCOUNTER — Ambulatory Visit (HOSPITAL_COMMUNITY)
Admission: RE | Admit: 2016-04-12 | Discharge: 2016-04-12 | Disposition: A | Discharge status: Home / Self Care | Payer: Self-pay | Source: Ambulatory Visit | Attending: Family Medicine | Admitting: Family Medicine

## 2016-04-12 ENCOUNTER — Encounter (HOSPITAL_COMMUNITY): Payer: Self-pay

## 2016-04-12 DIAGNOSIS — R109 Unspecified abdominal pain: Secondary | ICD-10-CM | POA: Insufficient documentation

## 2016-04-12 DIAGNOSIS — N1330 Unspecified hydronephrosis: Secondary | ICD-10-CM | POA: Insufficient documentation

## 2016-04-12 DIAGNOSIS — Q2733 Arteriovenous malformation of digestive system vessel: Secondary | ICD-10-CM | POA: Insufficient documentation

## 2016-04-12 DIAGNOSIS — R935 Abnormal findings on diagnostic imaging of other abdominal regions, including retroperitoneum: Secondary | ICD-10-CM | POA: Insufficient documentation

## 2016-04-12 MED ORDER — IOPAMIDOL (ISOVUE-300) INJECTION 61%
INTRAVENOUS | Status: AC
  Administered 2016-04-12: 100 mL
  Filled 2016-04-12: qty 100

## 2016-04-15 ENCOUNTER — Other Ambulatory Visit: Payer: Self-pay | Admitting: Family Medicine

## 2016-04-15 DIAGNOSIS — N1330 Unspecified hydronephrosis: Secondary | ICD-10-CM

## 2016-04-15 DIAGNOSIS — N390 Urinary tract infection, site not specified: Secondary | ICD-10-CM | POA: Insufficient documentation

## 2016-04-15 DIAGNOSIS — Q6211 Congenital occlusion of ureteropelvic junction: Secondary | ICD-10-CM | POA: Insufficient documentation

## 2016-04-15 MED FILL — HYDROCHLOROTHIAZIDE 25 MG T: 25 | 30 days supply | Qty: 30 | Fill #9

## 2016-04-15 MED FILL — AMLODIPINE BESYLATE 10 MG T: 10 | 30 days supply | Qty: 30 | Fill #9

## 2016-04-17 ENCOUNTER — Telehealth: Payer: Self-pay

## 2016-04-17 NOTE — Telephone Encounter (Signed)
Pt was called and informed of CT scan results and referral being placed.

## 2016-04-19 ENCOUNTER — Ambulatory Visit: Payer: Self-pay | Attending: Family Medicine

## 2016-04-19 ENCOUNTER — Telehealth: Payer: Self-pay | Admitting: Family Medicine

## 2016-04-19 ENCOUNTER — Ambulatory Visit: Payer: Self-pay | Attending: Family Medicine | Admitting: Family Medicine

## 2016-04-19 ENCOUNTER — Encounter: Payer: Self-pay | Admitting: Family Medicine

## 2016-04-19 VITALS — BP 128/80 | HR 65 | Temp 98.6°F | Ht 63.0 in | Wt 155.2 lb

## 2016-04-19 DIAGNOSIS — N130 Hydronephrosis with ureteropelvic junction obstruction: Secondary | ICD-10-CM | POA: Insufficient documentation

## 2016-04-19 DIAGNOSIS — R109 Unspecified abdominal pain: Secondary | ICD-10-CM | POA: Insufficient documentation

## 2016-04-19 DIAGNOSIS — R3911 Hesitancy of micturition: Secondary | ICD-10-CM | POA: Insufficient documentation

## 2016-04-19 DIAGNOSIS — R319 Hematuria, unspecified: Secondary | ICD-10-CM | POA: Insufficient documentation

## 2016-04-19 DIAGNOSIS — Q620 Congenital hydronephrosis: Secondary | ICD-10-CM

## 2016-04-19 DIAGNOSIS — Q6211 Congenital occlusion of ureteropelvic junction: Secondary | ICD-10-CM

## 2016-04-19 LAB — BASIC METABOLIC PANEL WITH GFR
BUN: 10 mg/dL (ref 7–25)
CALCIUM: 9.4 mg/dL (ref 8.6–10.4)
CO2: 29 mmol/L (ref 20–31)
CREATININE: 0.85 mg/dL (ref 0.50–0.99)
Chloride: 102 mmol/L (ref 98–110)
GFR, EST AFRICAN AMERICAN: 84 mL/min (ref >=60)
GFR, Est Non African American: 73 mL/min (ref >=60)
GLUCOSE: 84 mg/dL (ref 65–99)
Potassium: 3.4 mmol/L — ABNORMAL LOW (ref 3.5–5.3)
Sodium: 142 mmol/L (ref 135–146)

## 2016-04-19 MED ORDER — TAMSULOSIN HCL 0.4 MG PO CAPS: 0.4000 mg | ORAL_CAPSULE | Freq: Every day | ORAL | 3 refills | Status: DC

## 2016-04-19 MED FILL — ?TAMSULOSIN HCL 0.4 MG CAP: 0.4 | 30 days supply | Qty: 30 | Fill #0

## 2016-04-19 NOTE — Progress Notes (Signed)
Subjective:  Patient ID: Jacqueline Orozco, female    DOB: Oct 14, 1950  Age: 65 y.o. MRN: [id number]  CC: Follow-up   HPI Rashi Lodes presents for   1. L flank pain: recurrent left flank pain. She has also noticed blood in her urine. On 03/05/2016 she had similar symptoms and was treated for early L sided pyelonephritis with Levaquin x 5 day course at urgent care . Urine Cx was positive for 50-100 K E. Coli sensitive to fluoroquinolone. Resistant to  Macrobid. She has PCN allergy.  She completed the  course of antibiotics. She denies fever and chills.   Today she reports that she is still having pain at her left flank. She also has intermittent urinary hesitancy.   Urine cultures  03/05/2016 urine culture positive for 50-100 K E. Coli 04/08/16 that was positive for group B strep.   On 04/12/2016 she had CT abdomen and pelvis that was pertinent for: LEFT hydronephrosis without ureteral dilatation or identified urinary tract calcification consistent with LEFT UPJ obstruction.  Probable surgical clip at mid stomach with questionable gastric wall thickening versus artifact from underdistention, question related to history of gastric AVM.  Social History  Substance Use Topics  . Smoking status: Never Smoker  . Smokeless tobacco: Never Used  . Alcohol use Yes     Comment: occasional wine    Outpatient Medications Prior to Visit  Medication Sig Dispense Refill  . acetaminophen (TYLENOL 8 HOUR) 650 MG CR tablet Take 1 tablet (650 mg total) by mouth every 8 (eight) hours as needed for pain. 90 tablet 1  . amLODipine (NORVASC) 10 MG tablet Take 1 tablet (10 mg total) by mouth daily. 90 tablet 3  . atorvastatin (LIPITOR) 20 MG tablet Take 1 tablet (20 mg total) by mouth daily. 90 tablet 3  . ciprofloxacin (CIPRO) 500 MG tablet Take 1 tablet (500 mg total) by mouth 2 (two) times daily. 20 tablet 0  . dicyclomine (BENTYL) 20 MG tablet Take 1 tablet (20 mg total) by mouth 3 (three) times daily before  meals. 90 tablet 11  . docusate sodium (COLACE) 100 MG capsule Take 1 capsule (100 mg total) by mouth daily as needed for mild constipation. 90 capsule 1  . hydrochlorothiazide (HYDRODIURIL) 25 MG tablet Take 1 tablet (25 mg total) by mouth daily. 90 tablet 3  . naproxen sodium (ANAPROX) 220 MG tablet Take 220 mg by mouth 2 (two) times daily with a meal.    . omeprazole (PRILOSEC) 40 MG capsule Take 1 capsule (40 mg total) by mouth daily. 90 capsule 3  . polyethylene glycol powder (GLYCOLAX/MIRALAX) powder Take 17 g by mouth daily. 714 g 11   No facility-administered medications prior to visit.     ROS Review of Systems  Constitutional: Negative for chills and fever.  Eyes: Negative for visual disturbance.  Respiratory: Negative for shortness of breath.   Cardiovascular: Negative for chest pain.  Gastrointestinal: Positive for abdominal pain. Negative for blood in stool.  Musculoskeletal: Positive for arthralgias and back pain.  Skin: Negative for rash.  Allergic/Immunologic: Negative for immunocompromised state.  Neurological: Negative for dizziness and headaches.  Hematological: Negative for adenopathy. Does not bruise/bleed easily.  Psychiatric/Behavioral: Negative for dysphoric mood and suicidal ideas.    Objective:  BP 128/80 (BP Location: Left Arm, Patient Position: Sitting, Cuff Size: Small)   Pulse 65   Temp 98.6 F (37 C) (Oral)   Ht 5\' 3"  (1.6 m)   Wt 155 lb 3.2 oz (70.4  kg)   SpO2 98%   BMI 27.49 kg/m   BP/Weight 04/19/2016 04/08/2016 [id number]  Systolic BP 0000000 A999333 [id number]  Diastolic BP 80 83 83  Wt. (Lbs) 155.2 155.8 156.4  BMI 27.49 27.6 27.71    Physical Exam  Constitutional: She is oriented to person, place, and time. She appears well-developed and well-nourished. No distress.  HENT:  Head: Normocephalic and atraumatic.  Cardiovascular: Normal rate, regular rhythm, normal heart sounds and intact distal pulses.   Pulmonary/Chest: Effort normal and breath  sounds normal.  Abdominal: Soft. Bowel sounds are normal. She exhibits no distension and no mass. There is tenderness in the left lower quadrant. There is no rebound and no guarding. CVA tenderness: L side   Musculoskeletal: She exhibits no edema.       Hands: Neurological: She is alert and oriented to person, place, and time.  Skin: Skin is warm and dry. No rash noted.  Psychiatric: She has a normal mood and affect.    Assessment & Plan:   Ramah was seen today for follow-up.  Diagnoses and all orders for this visit:  Hydronephrosis with ureteropelvic junction (UPJ) obstruction -     CBC -     BASIC METABOLIC PANEL WITH GFR -     tamsulosin (FLOMAX) 0.4 MG CAPS capsule; Take 1 capsule (0.4 mg total) by mouth daily.    No orders of the defined types were placed in this encounter.   Follow-up: No Follow-up on file.   Boykin Nearing MD

## 2016-04-19 NOTE — Patient Instructions (Addendum)
Jacqueline Orozco was seen today for follow-up.  Diagnoses and all orders for this visit:  Hydronephrosis with ureteropelvic junction (UPJ) obstruction -     CBC -     BASIC METABOLIC PANEL WITH GFR -     tamsulosin (FLOMAX) 0.4 MG CAPS capsule; Take 1 capsule (0.4 mg total) by mouth daily.   Take tylenol for pain Avoid advil and ibuprofen   Start flomax to help urine pass  If you feel dizzy or lightheaded, stop hydrochlorothiazide until your body gets used to it.   You have been referred to urology They want to see in you in the office, we will arrange an appointment.   Wait to have your orange card renewed. This will be done today.   F/u with me in 3 weeks for left hydronephrosis   Dr. Adrian Blackwater

## 2016-04-19 NOTE — Telephone Encounter (Signed)
Paged Urologist on call, Dr. Alinda Money and reviewed CT scan findings to obtain recommendations.  Patient has left hydronephrosis with UPJ obstruction. She is uninsured but has been referred to urology.  Dr. Raynelle Bring recommendations are: -likely congenital obstruction  -f/u with urology recommended -likelihood of renal insufficiency or serious infection is low, but I am advised to page again of labs are abnormal.

## 2016-04-19 NOTE — Assessment & Plan Note (Addendum)
A: left hydronephrosis with UPJ obstruction. Patient w/o signs of symptoms of sepsis or pyelonephritis I spoke with Dr. Alinda Money, Urologist on call. Who recommended Urology f/u. Referral has already been placed but patient is uninsured with expired orange card. She will renew orange card today.   Plan: CT scan reviewed with patient flomax Patient advised to Stop HCTZ if she gets dizzy  CBC, BMP with GFR Avoid NSAID Close f/u to ensure she is able to see Urology

## 2016-04-19 NOTE — Telephone Encounter (Signed)
Sent Referral next month to [facility] .They will contact the patient to schedule an appointment with the specialist through the gccn card.

## 2016-04-20 LAB — CBC
HEMATOCRIT: 41.8 % (ref 35.0–45.0)
HEMOGLOBIN: 13.7 g/dL (ref 11.7–15.5)
MCH: 26.8 pg — AB (ref 27.0–33.0)
MCHC: 32.8 g/dL (ref 32.0–36.0)
MCV: 81.8 fL (ref 80.0–100.0)
MPV: 10.3 fL (ref 7.5–12.5)
Platelets: 190 10*3/uL (ref 140–400)
RBC: 5.11 MIL/uL — ABNORMAL HIGH (ref 3.80–5.10)
RDW: 14.7 % (ref 11.0–15.0)
WBC: 5.2 10*3/uL (ref 3.8–10.8)

## 2016-05-01 ENCOUNTER — Ambulatory Visit (INDEPENDENT_AMBULATORY_CARE_PROVIDER_SITE_OTHER): Payer: Self-pay | Admitting: Orthopaedic Surgery

## 2016-05-01 DIAGNOSIS — M79644 Pain in right finger(s): Secondary | ICD-10-CM

## 2016-05-01 DIAGNOSIS — G8929 Other chronic pain: Secondary | ICD-10-CM

## 2016-05-01 NOTE — Progress Notes (Signed)
   this patient very pleasant 65 year old I'm seeing for the first time. She had some type of our object removed from the tip of her right thumb about a year ago. She is complaining of pain to the tip of that thumb and there still something in the tip of the thumb. There are x-rays on transition for me to review. She denies any numbness and tingling hernia evidence of infection. She is able to use her thumb. It just feels like there is something there that her thumb and is following her to touch.  On examination of her right thumb she is neurovascular intact with range of motion of the IP and MCP joints. There is no redness there is no swelling there is no evidence infection. No drainage. There is a small area of callus type of skin at the tip of her thumb but I cannot palpated foreign body.  I did review the x-rays of her thumb he can see just a slight lucency in the soft tissue suggesting a foreign object but certainly it's difficult to see.  Plan: At this point certainly an interoffice procedure with performing a digital block and exploring the thumb tip is warranted. I extended to bring her back to the office we can do this at a regular scheduled time at the beginning or the end of the clinic. She understands this as well.

## 2016-05-10 ENCOUNTER — Ambulatory Visit: Payer: Self-pay | Attending: Family Medicine | Admitting: Family Medicine

## 2016-05-10 ENCOUNTER — Encounter: Payer: Self-pay | Admitting: Family Medicine

## 2016-05-10 DIAGNOSIS — Q6211 Congenital occlusion of ureteropelvic junction: Secondary | ICD-10-CM

## 2016-05-10 DIAGNOSIS — N1330 Unspecified hydronephrosis: Secondary | ICD-10-CM | POA: Insufficient documentation

## 2016-05-10 DIAGNOSIS — R109 Unspecified abdominal pain: Secondary | ICD-10-CM | POA: Insufficient documentation

## 2016-05-10 DIAGNOSIS — Q620 Congenital hydronephrosis: Secondary | ICD-10-CM

## 2016-05-10 LAB — POCT URINALYSIS DIPSTICK
Bilirubin, UA: NEGATIVE
Glucose, UA: NEGATIVE
KETONES UA: NEGATIVE
Leukocytes, UA: NEGATIVE
Nitrite, UA: NEGATIVE
PH UA: 7
PROTEIN UA: NEGATIVE
RBC UA: NEGATIVE
Spec Grav, UA: 1.01
Urobilinogen, UA: 0.2

## 2016-05-10 MED ORDER — TAMSULOSIN HCL 0.4 MG PO CAPS: 0.8000 mg | ORAL_CAPSULE | Freq: Every day | ORAL | 3 refills | Status: DC

## 2016-05-10 MED ORDER — PHENAZOPYRIDINE HCL 100 MG PO TABS: 100.0000 mg | ORAL_TABLET | Freq: Three times a day (TID) | ORAL | 0 refills | Status: DC | PRN

## 2016-05-10 MED ORDER — TAMSULOSIN HCL 0.4 MG PO CAPS: 0.4000 mg | ORAL_CAPSULE | Freq: Every day | ORAL | 3 refills | Status: DC

## 2016-05-10 MED FILL — ATORVASTATIN 20 MG TABLET: 20 | 30 days supply | Qty: 30 | Fill #9

## 2016-05-10 MED FILL — DICYCLOMINE 20 MG TABLET: 20 | 30 days supply | Qty: 90 | Fill #10

## 2016-05-10 NOTE — Progress Notes (Signed)
Pt is here today to follow up on hydronephrosis.

## 2016-05-10 NOTE — Assessment & Plan Note (Addendum)
A: hydronephrosis with UPJ obstruction Normal renal function Still with flank pain, dysuria and hesitancy Normal UA without evidence of infection   Plan: Continue flomax Add pyridium Patient awaiting urology appt

## 2016-05-10 NOTE — Progress Notes (Signed)
Subjective:  Patient ID: Jacqueline Orozco, female    DOB: 04/07/1951  Age: 65 y.o. MRN: [id number]  CC: Hydronephrosis   HPI Jacqueline Orozco presents for   1 Left hydronephrosis: recurrent left flank pain. She has also noticed blood in her urine. On 03/05/2016 she had similar symptoms and was treated for early L sided pyelonephritis with Levaquin x 5 day course at urgent care . Urine Cx was positive for 50-100 K E. Coli sensitive to fluoroquinolone. Resistant to  Macrobid. She has PCN allergy.  She completed the  course of antibiotics. She denies fever and chills.   Today she reports that she is still having pain at her left flank. Pain level is 5-6/10.  She is using topical pain relief patches. She also has dysuria. She completed ciprofloxacin prescribed on 04/08/2016. She is taking daily flomax 0.4 mg. She denies fever, chills, nausea and emesis. She has been referred to Urology and is awaiting appointment.   Urine cultures  03/05/2016 urine culture positive for 50-100 K E. Coli 04/08/16 that was positive for group B strep.   On 04/12/2016 she had CT abdomen and pelvis that was pertinent for: LEFT hydronephrosis without ureteral dilatation or identified urinary tract calcification consistent with LEFT UPJ obstruction.  Probable surgical clip at mid stomach with questionable gastric wall thickening versus artifact from underdistention, question related to history of gastric AVM.   2. Foreign body R thumb: she has established ortho who has recommended in office exploration following digital nerve block. She plans to schedule this as needed if pain get bad.   Social History  Substance Use Topics  . Smoking status: Never Smoker  . Smokeless tobacco: Never Used  . Alcohol use Yes     Comment: occasional wine    Outpatient Medications Prior to Visit  Medication Sig Dispense Refill  . acetaminophen (TYLENOL 8 HOUR) 650 MG CR tablet Take 1 tablet (650 mg total) by mouth every 8 (eight) hours as needed  for pain. 90 tablet 1  . amLODipine (NORVASC) 10 MG tablet Take 1 tablet (10 mg total) by mouth daily. 90 tablet 3  . atorvastatin (LIPITOR) 20 MG tablet Take 1 tablet (20 mg total) by mouth daily. 90 tablet 3  . ciprofloxacin (CIPRO) 500 MG tablet Take 1 tablet (500 mg total) by mouth 2 (two) times daily. 20 tablet 0  . dicyclomine (BENTYL) 20 MG tablet Take 1 tablet (20 mg total) by mouth 3 (three) times daily before meals. 90 tablet 11  . docusate sodium (COLACE) 100 MG capsule Take 1 capsule (100 mg total) by mouth daily as needed for mild constipation. 90 capsule 1  . hydrochlorothiazide (HYDRODIURIL) 25 MG tablet Take 1 tablet (25 mg total) by mouth daily. 90 tablet 3  . omeprazole (PRILOSEC) 40 MG capsule Take 1 capsule (40 mg total) by mouth daily. 90 capsule 3  . polyethylene glycol powder (GLYCOLAX/MIRALAX) powder Take 17 g by mouth daily. 714 g 11  . tamsulosin (FLOMAX) 0.4 MG CAPS capsule Take 1 capsule (0.4 mg total) by mouth daily. 30 capsule 3   No facility-administered medications prior to visit.     ROS Review of Systems  Constitutional: Negative for chills and fever.  Eyes: Negative for visual disturbance.  Respiratory: Negative for shortness of breath.   Cardiovascular: Negative for chest pain.  Gastrointestinal: Positive for abdominal pain. Negative for blood in stool.  Musculoskeletal: Positive for arthralgias and back pain.  Skin: Negative for rash.  Allergic/Immunologic: Negative for immunocompromised  state.  Neurological: Negative for dizziness and headaches.  Hematological: Negative for adenopathy. Does not bruise/bleed easily.  Psychiatric/Behavioral: Negative for dysphoric mood and suicidal ideas.    Objective:  BP 135/85 (BP Location: Left Arm, Patient Position: Sitting, Cuff Size: Small)   Pulse 71   Temp 98.2 F (36.8 C) (Oral)   Ht 5\' 3"  (1.6 m)   Wt 155 lb 6.4 oz (70.5 kg)   SpO2 99%   BMI 27.53 kg/m   BP/Weight 05/10/2016 04/19/2016 [id number]    Systolic BP A999333 0000000 A999333  Diastolic BP 85 80 83  Wt. (Lbs) 155.4 155.2 155.8  BMI 27.53 27.49 27.6    Physical Exam  Constitutional: She is oriented to person, place, and time. She appears well-developed and well-nourished. No distress.  HENT:  Head: Normocephalic and atraumatic.  Cardiovascular: Normal rate, regular rhythm, normal heart sounds and intact distal pulses.   Pulmonary/Chest: Effort normal and breath sounds normal.  Abdominal: Soft. Bowel sounds are normal. She exhibits no distension and no mass. There is tenderness in the left lower quadrant. There is no rebound and no guarding. CVA tenderness: L side   Musculoskeletal: She exhibits no edema.       Hands: Neurological: She is alert and oriented to person, place, and time.  Skin: Skin is warm and dry. No rash noted.  Psychiatric: She has a normal mood and affect.     Chemistry      Component Value Date/Time   NA 142 04/19/2016 0954   K 3.4 (L) 04/19/2016 0954   CL 102 04/19/2016 0954   CO2 29 04/19/2016 0954   BUN 10 04/19/2016 0954   CREATININE 0.85 04/19/2016 0954      Component Value Date/Time   CALCIUM 9.4 04/19/2016 0954   ALKPHOS 60 11/23/2015 1203   AST 20 11/23/2015 1203   ALT 15 11/23/2015 1203   BILITOT 0.3 11/23/2015 1203     Lab Results  Component Value Date   WBC 5.2 04/19/2016   HGB 13.7 04/19/2016   HCT 41.8 04/19/2016   MCV 81.8 04/19/2016   PLT 190 04/19/2016   UA: normal   Assessment & Plan:   Jacqueline Orozco was seen today for hydronephrosis.  Diagnoses and all orders for this visit:  Hydronephrosis with ureteropelvic junction (UPJ) obstruction -     Discontinue: tamsulosin (FLOMAX) 0.4 MG CAPS capsule; Take 2 capsules (0.8 mg total) by mouth daily. -     tamsulosin (FLOMAX) 0.4 MG CAPS capsule; Take 1 capsule (0.4 mg total) by mouth daily. -     Urinalysis Dipstick -     phenazopyridine (PYRIDIUM) 100 MG tablet; Take 1 tablet (100 mg total) by mouth 3 (three) times daily as needed for  pain.    No orders of the defined types were placed in this encounter.   Follow-up: Return in about 6 weeks (around 06/21/2016) for Left flank pain and hydronephrosis .   Boykin Nearing MD

## 2016-05-10 NOTE — Patient Instructions (Addendum)
Jacqueline Orozco was seen today for hydronephrosis.  Diagnoses and all orders for this visit:  Hydronephrosis with ureteropelvic junction (UPJ) obstruction -     Discontinue: tamsulosin (FLOMAX) 0.4 MG CAPS capsule; Take 2 capsules (0.8 mg total) by mouth daily. -     tamsulosin (FLOMAX) 0.4 MG CAPS capsule; Take 1 capsule (0.4 mg total) by mouth daily. -     Urinalysis Dipstick -     phenazopyridine (PYRIDIUM) 100 MG tablet; Take 1 tablet (100 mg total) by mouth 3 (three) times daily as needed for pain.  normal UA  Pyridium will turn urine orange/red     Physiological scientist number Phone: [phone]  F/u in 6 weeks for L flank pain with L hydronephrosis.   Dr. Adrian Blackwater

## 2016-05-17 MED FILL — ?TAMSULOSIN HCL 0.4 MG CAP: 0.4 | 30 days supply | Qty: 30 | Fill #0

## 2016-05-17 MED FILL — HYDROCHLOROTHIAZIDE 25 MG T: 25 | 30 days supply | Qty: 30 | Fill #10

## 2016-05-17 MED FILL — AMLODIPINE BESYLATE 10 MG T: 10 | 30 days supply | Qty: 30 | Fill #10

## 2016-05-20 MED FILL — OMEPRAZOLE DR 40 MG CAPSULE: 40 | 30 days supply | Qty: 30 | Fill #9

## 2016-05-20 MED FILL — POLYETHYLENE GLYCOL 3350: 3 days supply | Qty: 51 | Fill #6

## 2016-05-22 ENCOUNTER — Other Ambulatory Visit (HOSPITAL_COMMUNITY): Payer: Self-pay | Admitting: Urology

## 2016-05-22 ENCOUNTER — Ambulatory Visit (INDEPENDENT_AMBULATORY_CARE_PROVIDER_SITE_OTHER): Payer: Self-pay | Admitting: Orthopaedic Surgery

## 2016-05-22 DIAGNOSIS — N130 Hydronephrosis with ureteropelvic junction obstruction: Secondary | ICD-10-CM

## 2016-05-27 ENCOUNTER — Other Ambulatory Visit: Payer: Self-pay | Admitting: Urology

## 2016-05-27 NOTE — Progress Notes (Signed)
Pt is being scheduled for preop appt; please place surgical orders in epic. Thanks.  

## 2016-06-04 ENCOUNTER — Encounter (HOSPITAL_COMMUNITY)
Admission: RE | Admit: 2016-06-04 | Discharge: 2016-06-04 | Disposition: A | Discharge status: Home / Self Care | Payer: Self-pay | Source: Ambulatory Visit | Attending: Urology | Admitting: Urology

## 2016-06-04 DIAGNOSIS — N130 Hydronephrosis with ureteropelvic junction obstruction: Secondary | ICD-10-CM | POA: Insufficient documentation

## 2016-06-04 MED ORDER — FUROSEMIDE 10 MG/ML IJ SOLN: 35.2500 mg | Freq: Once | INTRAMUSCULAR | Status: DC

## 2016-06-04 MED ORDER — FUROSEMIDE 10 MG/ML IJ SOLN
INTRAMUSCULAR | Status: AC
  Filled 2016-06-04: qty 4

## 2016-06-04 MED ORDER — TECHNETIUM TC 99M MERTIATIDE
5.1400 | Freq: Once | INTRAVENOUS | Status: AC | PRN
  Administered 2016-06-04: 5.14 via INTRAVENOUS

## 2016-06-06 NOTE — Patient Instructions (Addendum)
Jacqueline Orozco  06/06/2016   Your procedure is scheduled on: Monday 06/17/2016  Report to [facility] Main  Entrance take [place]  elevators to 3rd floor to  [place] at   [place]  AM.  Call this number if you have problems the morning of surgery [phone]   Remember: ONLY 1 PERSON MAY GO WITH YOU TO SHORT STAY TO GET  READY MORNING OF [place].                    FOLLOW BOWEL PREP INSTRUCTIONS FROM DR. BORDEN'S OFFICE THE DAY BEFORE SURGERY ALONG WITH A CLEAR LIQUID DIET ON Sunday 06/16/2016!              DRINK ONE BOTTLE MAGNESIUM CITRATE BY NOON ON Sunday 06/16/2016 AND FOLLOW THE CLEAR LIQUID DIET UP UNTIL MIDNIGHT!              USE ONE FLEET'S ENEMA NIGHT BEFORE SURGERY!                CLEAR LIQUID DIET   Foods Allowed                                                                     Foods Excluded  Coffee and tea, regular and decaf                             liquids that you cannot  Plain Jell-O in any flavor                                             see through such as: Fruit ices (not with fruit pulp)                                     milk, soups, orange juice  Iced Popsicles                                    All solid food Carbonated beverages, regular and diet                                    Cranberry, grape and apple juices Sports drinks like Gatorade Lightly seasoned clear broth or consume(fat free) Sugar, honey syrup  Sample Menu Breakfast                                Lunch                                     Supper Cranberry juice  Beef broth                            Chicken broth Jell-O                                     Grape juice                           Apple juice Coffee or tea                        Jell-O                                      Popsicle                                                Coffee or tea                        Coffee or  tea  _____________________________________________________________________     Do not eat food or drink liquids :After Midnight.     Take these medicines the morning of surgery with A SIP OF WATER: amlodipine (norvasc), prilosec (omeprazole)                                   You may not have any metal on your body including hair pins and              piercings  Do not wear jewelry, make-up, lotions, powders or perfumes, deodorant             Do not wear nail polish.  Do not shave  48 hours prior to surgery.              Men may shave face and neck.   Do not bring valuables to the hospital. [place].  Contacts, dentures or bridgework may not be worn into surgery.  Leave suitcase in the car. After surgery it may be brought to your room.                  Please read over the following fact sheets you were given: _____________________________________________________________________             [facility] - Preparing for Surgery Before surgery, you can play an important role.  Because skin is not sterile, your skin needs to be as free of germs as possible.  You can reduce the number of germs on your skin by washing with CHG (chlorahexidine gluconate) soap before surgery.  CHG is an antiseptic cleaner which kills germs and bonds with the skin to continue killing germs even after washing. Please DO NOT use if you have an allergy to CHG or antibacterial soaps.  If your skin becomes reddened/irritated stop using the CHG and inform your nurse when you arrive at Short Stay. Do not shave (including legs  and underarms) for at least 48 hours prior to the first CHG shower.  You may shave your face/neck. Please follow these instructions carefully:  1.  Shower with CHG Soap the night before surgery and the  morning of Surgery.  2.  If you choose to wash your hair, wash your hair first as usual with your  normal  shampoo.  3.  After you  shampoo, rinse your hair and body thoroughly to remove the  shampoo.                           4.  Use CHG as you would any other liquid soap.  You can apply chg directly  to the skin and wash                       Gently with a scrungie or clean washcloth.  5.  Apply the CHG Soap to your body ONLY FROM THE NECK DOWN.   Do not use on face/ open                           Wound or open sores. Avoid contact with eyes, ears mouth and genitals (private parts).                       Wash face,  Genitals (private parts) with your normal soap.             6.  Wash thoroughly, paying special attention to the area where your surgery  will be performed.  7.  Thoroughly rinse your body with warm water from the neck down.  8.  DO NOT shower/wash with your normal soap after using and rinsing off  the CHG Soap.                9.  Pat yourself dry with a clean towel.            10.  Wear clean pajamas.            11.  Place clean sheets on your bed the night of your first shower and do not  sleep with pets. Day of Surgery : Do not apply any lotions/deodorants the morning of surgery.  Please wear clean clothes to the hospital/surgery center.  FAILURE TO FOLLOW THESE INSTRUCTIONS MAY RESULT IN THE CANCELLATION OF YOUR SURGERY PATIENT SIGNATURE_________________________________  NURSE SIGNATURE__________________________________  ________________________________________________________________________   Adam Phenix  An incentive spirometer is a tool that can help keep your lungs clear and active. This tool measures how well you are filling your lungs with each breath. Taking long deep breaths may help reverse or decrease the chance of developing breathing (pulmonary) problems (especially infection) following:  A long period of time when you are unable to move or be active. BEFORE THE PROCEDURE   If the spirometer includes an indicator to show your best effort, your nurse or respiratory therapist  will set it to a desired goal.  If possible, sit up straight or lean slightly forward. Try not to slouch.  Hold the incentive spirometer in an upright position. INSTRUCTIONS FOR USE  1. Sit on the edge of your bed if possible, or sit up as far as you can in bed or on a chair. 2. Hold the incentive spirometer in an upright position. 3. Breathe out normally. 4. Place the mouthpiece in your mouth and seal  your lips tightly around it. 5. Breathe in slowly and as deeply as possible, raising the piston or the ball toward the top of the column. 6. Hold your breath for 3-5 seconds or for as long as possible. Allow the piston or ball to fall to the bottom of the column. 7. Remove the mouthpiece from your mouth and breathe out normally. 8. Rest for a few seconds and repeat Steps 1 through 7 at least 10 times every 1-2 hours when you are awake. Take your time and take a few normal breaths between deep breaths. 9. The spirometer may include an indicator to show your best effort. Use the indicator as a goal to work toward during each repetition. 10. After each set of 10 deep breaths, practice coughing to be sure your lungs are clear. If you have an incision (the cut made at the time of surgery), support your incision when coughing by placing a pillow or rolled up towels firmly against it. Once you are able to get out of bed, walk around indoors and cough well. You may stop using the incentive spirometer when instructed by your caregiver.  RISKS AND COMPLICATIONS  Take your time so you do not get dizzy or light-headed.  If you are in pain, you may need to take or ask for pain medication before doing incentive spirometry. It is harder to take a deep breath if you are having pain. AFTER USE  Rest and breathe slowly and easily.  It can be helpful to keep track of a log of your progress. Your caregiver can provide you with a simple table to help with this. If you are using the spirometer at home, follow  these instructions: [place] IF:   You are having difficultly using the spirometer.  You have trouble using the spirometer as often as instructed.  Your pain medication is not giving enough relief while using the spirometer.  You develop fever of 100.5 F (38.1 C) or higher. SEEK IMMEDIATE MEDICAL CARE IF:   You cough up bloody sputum that had not been present before.  You develop fever of 102 F (38.9 C) or greater.  You develop worsening pain at or near the incision site. MAKE SURE YOU:   Understand these instructions.  Will watch your condition.  Will get help right away if you are not doing well or get worse. Document Released: 10/07/2006 Document Revised: 08/19/2011 Document Reviewed: 12/08/2006 Exit[employer] Patient Information 2014 Exit[employer], [place].   ________________________________________________________________________  WHAT IS A BLOOD TRANSFUSION? Blood Transfusion Information  A transfusion is the replacement of blood or some of its parts. Blood is made up of multiple cells which provide different functions.  Red blood cells carry oxygen and are used for blood loss replacement.  White blood cells fight against infection.  Platelets control bleeding.  Plasma helps clot blood.  Other blood products are available for specialized needs, such as hemophilia or other clotting disorders. BEFORE THE TRANSFUSION  Who gives blood for transfusions?   Healthy volunteers who are fully evaluated to make sure their blood is safe. This is blood bank blood. Transfusion therapy is the safest it has ever been in the practice of medicine. Before blood is taken from a donor, a complete history is taken to make sure that person has no history of diseases nor engages in risky social behavior (examples are intravenous drug use or sexual activity with multiple partners). The donor's travel history is screened to minimize risk of transmitting infections, such as  malaria. The  donated blood is tested for signs of infectious diseases, such as HIV and hepatitis. The blood is then tested to be sure it is compatible with you in order to minimize the chance of a transfusion reaction. If you or a relative donates blood, this is often done in anticipation of surgery and is not appropriate for emergency situations. It takes many days to process the donated blood. RISKS AND COMPLICATIONS Although transfusion therapy is very safe and saves many lives, the main dangers of transfusion include:   Getting an infectious disease.  Developing a transfusion reaction. This is an allergic reaction to something in the blood you were given. Every precaution is taken to prevent this. The decision to have a blood transfusion has been considered carefully by your caregiver before blood is given. Blood is not given unless the benefits outweigh the risks. AFTER THE TRANSFUSION  Right after receiving a blood transfusion, you will usually feel much better and more energetic. This is especially true if your red blood cells have gotten low (anemic). The transfusion raises the level of the red blood cells which carry oxygen, and this usually causes an energy increase.  The nurse administering the transfusion will monitor you carefully for complications. HOME CARE INSTRUCTIONS  No special instructions are needed after a transfusion. You may find your energy is better. Speak with your caregiver about any limitations on activity for underlying diseases you may have. SEEK MEDICAL CARE IF:   Your condition is not improving after your transfusion.  You develop redness or irritation at the intravenous (IV) site. SEEK IMMEDIATE MEDICAL CARE IF:  Any of the following symptoms occur over the next 12 hours:  Shaking chills.  You have a temperature by mouth above 102 F (38.9 C), not controlled by medicine.  Chest, back, or muscle pain.  People around you feel you are not acting correctly or are  confused.  Shortness of breath or difficulty breathing.  Dizziness and fainting.  You get a rash or develop hives.  You have a decrease in urine output.  Your urine turns a dark color or changes to pink, red, or brown. Any of the following symptoms occur over the next 10 days:  You have a temperature by mouth above 102 F (38.9 C), not controlled by medicine.  Shortness of breath.  Weakness after normal activity.  The white part of the eye turns yellow (jaundice).  You have a decrease in the amount of urine or are urinating less often.  Your urine turns a dark color or changes to pink, red, or brown. Document Released: 05/24/2000 Document Revised: 08/19/2011 Document Reviewed: 01/11/2008 [facility] Patient Information 2014 [place], [place].  _______________________________________________________________________

## 2016-06-11 ENCOUNTER — Encounter (HOSPITAL_COMMUNITY): Payer: Self-pay

## 2016-06-11 ENCOUNTER — Encounter (HOSPITAL_COMMUNITY)
Admission: RE | Admit: 2016-06-11 | Discharge: 2016-06-11 | Disposition: A | Discharge status: Home / Self Care | Payer: Self-pay | Source: Ambulatory Visit | Attending: Urology | Admitting: Urology

## 2016-06-11 DIAGNOSIS — B181 Chronic viral hepatitis B without delta-agent: Secondary | ICD-10-CM | POA: Insufficient documentation

## 2016-06-11 DIAGNOSIS — M722 Plantar fascial fibromatosis: Secondary | ICD-10-CM | POA: Insufficient documentation

## 2016-06-11 DIAGNOSIS — K5900 Constipation, unspecified: Secondary | ICD-10-CM | POA: Insufficient documentation

## 2016-06-11 DIAGNOSIS — Q2733 Arteriovenous malformation of digestive system vessel: Secondary | ICD-10-CM | POA: Insufficient documentation

## 2016-06-11 DIAGNOSIS — Q620 Congenital hydronephrosis: Secondary | ICD-10-CM | POA: Insufficient documentation

## 2016-06-11 DIAGNOSIS — Z01812 Encounter for preprocedural laboratory examination: Secondary | ICD-10-CM | POA: Insufficient documentation

## 2016-06-11 DIAGNOSIS — M5412 Radiculopathy, cervical region: Secondary | ICD-10-CM | POA: Insufficient documentation

## 2016-06-11 DIAGNOSIS — E785 Hyperlipidemia, unspecified: Secondary | ICD-10-CM | POA: Insufficient documentation

## 2016-06-11 DIAGNOSIS — I10 Essential (primary) hypertension: Secondary | ICD-10-CM | POA: Insufficient documentation

## 2016-06-11 DIAGNOSIS — Z86010 Personal history of colonic polyps: Secondary | ICD-10-CM | POA: Insufficient documentation

## 2016-06-11 DIAGNOSIS — K31819 Angiodysplasia of stomach and duodenum without bleeding: Secondary | ICD-10-CM | POA: Insufficient documentation

## 2016-06-11 DIAGNOSIS — K589 Irritable bowel syndrome without diarrhea: Secondary | ICD-10-CM | POA: Insufficient documentation

## 2016-06-11 LAB — CBC
HCT: 40.8 % (ref 36.0–46.0)
Hemoglobin: 13.5 g/dL (ref 12.0–15.0)
MCH: 26.4 pg (ref 26.0–34.0)
MCHC: 33.1 g/dL (ref 30.0–36.0)
MCV: 79.7 fL (ref 78.0–100.0)
PLATELETS: 163 10*3/uL (ref 150–400)
RBC: 5.12 MIL/uL — AB (ref 3.87–5.11)
RDW: 13.9 % (ref 11.5–15.5)
WBC: 5.1 10*3/uL (ref 4.0–10.5)

## 2016-06-11 LAB — BASIC METABOLIC PANEL
Anion gap: 9 (ref 5–15)
BUN: 12 mg/dL (ref 6–20)
CALCIUM: 9.6 mg/dL (ref 8.9–10.3)
CO2: 28 mmol/L (ref 22–32)
CREATININE: 0.66 mg/dL (ref 0.44–1.00)
Chloride: 103 mmol/L (ref 101–111)
GFR calc non Af Amer: >60 mL/min (ref >60)
Glucose, Bld: 91 mg/dL (ref 65–99)
Potassium: 3.4 mmol/L — ABNORMAL LOW (ref 3.5–5.1)
Sodium: 140 mmol/L (ref 135–145)

## 2016-06-11 LAB — ABO/RH: ABO/RH(D): B POS

## 2016-06-11 NOTE — Progress Notes (Signed)
04/08/2016- noted EKG in EPIC. 04/12/2016- noted in EPIC- CT abd./pelvis w/ contrast.

## 2016-06-14 NOTE — H&P (Signed)
Office Visit Report     05/22/2016   --------------------------------------------------------------------------------   Jacqueline Orozco  MRN: [id number]  PRIMARY CARE:  Cathlean Cower, MD  DOB: June 14, 1950, 66 year old Female  REFERRING:    SSN:   PROVIDER:  Raynelle Bring, M.D.    SUPERVISING:  Carolan Clines, M.D.    TREATING:  Lorayne Bender    LOCATION:  [facility] [phone]   --------------------------------------------------------------------------------   CC: I have hydronephrosis.  HPI: Jacqueline Orozco is a 66 year-old female patient who is here for hydronephrosis.    66 year old [place] female seen today for left hydronephrosis concerning for UPJ obstruction. She is present today with an interpreter. She reports onset of left flank pain in September 2017. She had a CT performed 04/12/16 that we have available for review. She has had ongoing left flank pain, that is worse with lying on her side. She has had one episode of pyelonephritis that has been treated. She has previously seen blood in her urine but not recently. She does have dysuria. She has had no prior abdominal surgery. She has sufficient pain medication on hand that helps with her discomfort.      ALLERGIES: Adult Aspirin EC Low Strength TBEC Penicillins Streptomycin Sulfate Powder    MEDICATIONS: Metoprolol Tartrate 25 MG Oral Tablet Oral  Simvastatin 20 MG Oral Tablet Oral     GU PSH: None     PSH Notes: No Surgical Problems   NON-GU PSH: None   GU PMH: Enuresis, Nocturnal, Enuresis, nocturnal only - 2014 Mixed incontinence, Urge and stress incontinence - 2014 Nocturia, Nocturia - 2014 Urinary Frequency, Increased urinary frequency - 2014      PMH Notes:  1898-06-10 00:00:00 - Note: Normal Routine History And Physical Adult  2012-01-30 09:29:13 - Note: Arthritis   NON-GU PMH: Other specified anxiety disorders, Depression With Anxiety - 2014 Personal history of other diseases of the  circulatory system, History of hypertension - 2014 Personal history of other endocrine, nutritional and metabolic disease, History of hypercholesterolemia - 2014 Personal history of other specified conditions, History of heartburn - 2014    FAMILY HISTORY: No Significant Family History - Runs In Family   SOCIAL HISTORY: Marital Status: Married Current Smoking Status: Patient has never smoked.  Social Drinker.  Drinks 1 caffeinated drink per day.     Notes: Marital History - Currently Married, Caffeine Use, Alcohol Use, Occupation:, Never A Smoker   REVIEW OF SYSTEMS:    GU Review Female:   Patient reports frequent urination, hard to postpone urination, burning /pain with urination, get up at night to urinate, leakage of urine, stream starts and stops, and have to strain to urinate. Patient denies trouble starting your stream and currently pregnant.  Gastrointestinal (Upper):   Patient reports indigestion/ heartburn. Patient denies nausea and vomiting.  Gastrointestinal (Lower):   Patient reports constipation. Patient denies diarrhea.  Constitutional:   Patient reports night sweats. Patient denies fever, weight loss, and fatigue.  Skin:   Patient denies itching and skin rash/ lesion.  Eyes:   Patient reports blurred vision. Patient denies double vision.  Ears/ Nose/ Throat:   Patient denies sore throat and sinus problems.  Hematologic/Lymphatic:   Patient reports easy bruising. Patient denies swollen glands.  Cardiovascular:   Patient reports chest pains. Patient denies leg swelling.  Respiratory:   Patient denies cough and shortness of breath.  Endocrine:   Patient denies excessive thirst.  Musculoskeletal:   Patient denies back pain and  joint pain.  Neurological:   Patient denies headaches and dizziness.  Psychologic:   Patient denies depression and anxiety.   VITAL SIGNS:      05/22/2016 02:18 PM  Weight 155 lb / 70.31 kg  Height 63 in / 160.02 cm  BP 120/70 mmHg  Pulse 64 /min   Temperature 98.6 F / 37 C  BMI 27.5 kg/m   MULTI-SYSTEM PHYSICAL EXAMINATION:    Constitutional: Well-nourished. No physical deformities. Normally developed. Good grooming.  Respiratory: No labored breathing, no use of accessory muscles.   Cardiovascular: Normal temperature, normal extremity pulses, no swelling, no varicosities.  Skin: No paleness, no jaundice, no cyanosis. No lesion, no ulcer, no rash.  Neurologic / Psychiatric: Oriented to time, oriented to place, oriented to person. No depression, no anxiety, no agitation.  Gastrointestinal: No mass, no rigidity, non obese abdomen. Tenderness to LLQ and left CVA tenderness  Eyes: Normal conjunctivae. Normal eyelids.  Ears, Nose, Mouth, and Throat: Left ear no scars, no lesions, no masses. Right ear no scars, no lesions, no masses. Nose no scars, no lesions, no masses. Normal hearing. Normal lips.  Musculoskeletal: Normal gait and station of head and neck.     PAST DATA REVIEWED:  Source Of History:  Patient  Lab Test Review:   CMP  Records Review:   Previous Hospital Records  Urine Test Review:   Urinalysis, Urine Culture and Sensitivity  X-Ray Review: C.T. Abdomen/Pelvis: Reviewed Films. Reviewed Report. Discussed With Patient. Left hydronephrosis with UPJ obstruction secondary to crossing lower pole vessel. Parenchyma of kidney appears preserved    PROCEDURES:          Urinalysis Dipstick Dipstick Cont'd  Color: Yellow Bilirubin: Neg  Appearance: Clear Ketones: Neg  Specific Gravity: 1.015 Blood: Neg  pH: 6.0 Protein: Neg  Glucose: Neg Urobilinogen: 0.2    Nitrites: Neg    Leukocyte Esterase: Neg    ASSESSMENT:      ICD-10 Details  1 GU:   Hydronephrosis with ureteropelvic junction obstruction - N13.0   2   Flank Pain - R10.84    PLAN:           Orders Labs Urine Culture and Sensitivity          Schedule X-Rays: 1 Week - Lasix Renogram - Outside  Return Notes: Will schedule surgery after Lasix Renogram study           Document Letter(s):  Created for Patient: Clinical Summary         Notes:   Discussed the CT findings with the patient, which demonstrates a left UPJ obstruction secondary to a crossing lower pole vessel. The renal parenchyma appears preserved. Given her persistent and severe pain, I recommended treatment with robotic dismembered pyeloplasty. I discussed the procedure in detail, the risks, benefits, alternatives, potential complications. Discussed that she would be in the hospital for at least one night and would need a ureteral stent post-operatively. She is eager to proceed. We will obtain a baseline lasix renogram to evaluate split function at her earliest convenience but will go ahead and proceed with scheduling surgery. Urine culture sent today. Will need return visit for cystoscopic ureteral stent removal approximately 4-6 weeks after surgery.   Discussed with Dr. Gaynelle Arabian. Also discussed with Dr. Alinda Money, who will perform the surgery.     * Signed by Lorayne Bender on 05/22/16 at 3:59 PM (EST)* * Signed by Carolan Clines, M.D. on 05/24/16 at 5:02 PM (EST)*       APPENDED  NOTES:  I have discussed this patient's case with Dr. Jonny Ruiz in detail and reviewed her imaging studies and history. Her history is consistent with a symptomatic left ureteropelvic junction obstruction. She is going to proceed with a Lasix renal scan for baseline evaluation. She will then be scheduled for cystoscopy, left ureteral stent placement and left robot-assisted laparoscopic pyeloplasty. I agree with the above assessment and plan.     * Signed by Raynelle Bring, M.D. on 05/23/16 at 6:07 PM (EST)*            * Signed by Carolan Clines, M.D. on 05/24/16 at 5:02 PM (EST)*

## 2016-06-17 ENCOUNTER — Inpatient Hospital Stay (HOSPITAL_COMMUNITY): Payer: Self-pay

## 2016-06-17 ENCOUNTER — Inpatient Hospital Stay (HOSPITAL_COMMUNITY): Payer: Self-pay | Admitting: Certified Registered Nurse Anesthetist

## 2016-06-17 ENCOUNTER — Inpatient Hospital Stay (HOSPITAL_COMMUNITY)
Admission: RE | Admit: 2016-06-17 | Discharge: 2016-06-18 | DRG: 660 | Disposition: A | Discharge status: Home / Self Care | Payer: Self-pay | Source: Ambulatory Visit | Attending: Urology | Admitting: Urology

## 2016-06-17 ENCOUNTER — Encounter (HOSPITAL_COMMUNITY): Payer: Self-pay | Admitting: *Deleted

## 2016-06-17 ENCOUNTER — Encounter (HOSPITAL_COMMUNITY)
Admission: RE | Disposition: A | Discharge status: Home / Self Care | Payer: Self-pay | Source: Ambulatory Visit | Attending: Urology

## 2016-06-17 DIAGNOSIS — Z886 Allergy status to analgesic agent status: Secondary | ICD-10-CM

## 2016-06-17 DIAGNOSIS — Z880 Allergy status to penicillin: Secondary | ICD-10-CM

## 2016-06-17 DIAGNOSIS — Q6211 Congenital occlusion of ureteropelvic junction: Secondary | ICD-10-CM

## 2016-06-17 DIAGNOSIS — Z881 Allergy status to other antibiotic agents status: Secondary | ICD-10-CM

## 2016-06-17 DIAGNOSIS — N135 Crossing vessel and stricture of ureter without hydronephrosis: Secondary | ICD-10-CM | POA: Diagnosis present

## 2016-06-17 DIAGNOSIS — N130 Hydronephrosis with ureteropelvic junction obstruction: Principal | ICD-10-CM | POA: Diagnosis present

## 2016-06-17 DIAGNOSIS — Q6239 Other obstructive defects of renal pelvis and ureter: Secondary | ICD-10-CM

## 2016-06-17 DIAGNOSIS — I10 Essential (primary) hypertension: Secondary | ICD-10-CM | POA: Diagnosis present

## 2016-06-17 DIAGNOSIS — Z885 Allergy status to narcotic agent status: Secondary | ICD-10-CM

## 2016-06-17 DIAGNOSIS — Z91040 Latex allergy status: Secondary | ICD-10-CM

## 2016-06-17 HISTORY — PX: ROBOT ASSISTED PYELOPLASTY: SHX5143

## 2016-06-17 HISTORY — PX: CYSTOSCOPY W/ URETERAL STENT PLACEMENT: SHX1429

## 2016-06-17 LAB — TYPE AND SCREEN
ABO/RH(D): B POS
Antibody Screen: NEGATIVE

## 2016-06-17 LAB — BASIC METABOLIC PANEL
Anion gap: 8 (ref 5–15)
BUN: 12 mg/dL (ref 6–20)
CALCIUM: 8.8 mg/dL — AB (ref 8.9–10.3)
CO2: 29 mmol/L (ref 22–32)
CREATININE: 0.98 mg/dL (ref 0.44–1.00)
Chloride: 104 mmol/L (ref 101–111)
GFR calc Af Amer: >60 mL/min (ref >60)
GLUCOSE: 145 mg/dL — AB (ref 65–99)
Potassium: 3.1 mmol/L — ABNORMAL LOW (ref 3.5–5.1)
Sodium: 141 mmol/L (ref 135–145)

## 2016-06-17 LAB — HEMOGLOBIN AND HEMATOCRIT, BLOOD
HCT: 39.1 % (ref 36.0–46.0)
Hemoglobin: 12.8 g/dL (ref 12.0–15.0)

## 2016-06-17 SURGERY — PYELOPLASTY, ROBOT-ASSISTED
Anesthesia: General | Laterality: Left

## 2016-06-17 MED ORDER — SODIUM CHLORIDE 0.9 % IJ SOLN
INTRAMUSCULAR | Status: AC
  Filled 2016-06-17: qty 20

## 2016-06-17 MED ORDER — SUCCINYLCHOLINE CHLORIDE 200 MG/10ML IV SOSY
PREFILLED_SYRINGE | INTRAVENOUS | Status: DC | PRN
  Administered 2016-06-17: 100 mg via INTRAVENOUS

## 2016-06-17 MED ORDER — PROMETHAZINE HCL 25 MG/ML IJ SOLN: 6.2500 mg | INTRAMUSCULAR | Status: DC | PRN

## 2016-06-17 MED ORDER — ONDANSETRON HCL 4 MG/2ML IJ SOLN
4.0000 mg | INTRAMUSCULAR | Status: DC | PRN
  Administered 2016-06-17: 4 mg via INTRAVENOUS
  Filled 2016-06-17: qty 2

## 2016-06-17 MED ORDER — FENTANYL CITRATE (PF) 250 MCG/5ML IJ SOLN
INTRAMUSCULAR | Status: AC
  Filled 2016-06-17: qty 5

## 2016-06-17 MED ORDER — HYDROCHLOROTHIAZIDE 25 MG PO TABS
25.0000 mg | ORAL_TABLET | Freq: Every day | ORAL | Status: DC
  Administered 2016-06-18: 25 mg via ORAL
  Filled 2016-06-17: qty 1

## 2016-06-17 MED ORDER — MIDAZOLAM HCL 5 MG/5ML IJ SOLN
INTRAMUSCULAR | Status: DC | PRN
  Administered 2016-06-17: 2 mg via INTRAVENOUS

## 2016-06-17 MED ORDER — ACETAMINOPHEN 10 MG/ML IV SOLN
1000.0000 mg | Freq: Four times a day (QID) | INTRAVENOUS | Status: AC
  Administered 2016-06-17 – 2016-06-18 (×4): 1000 mg via INTRAVENOUS
  Filled 2016-06-17 (×4): qty 100

## 2016-06-17 MED ORDER — POTASSIUM CHLORIDE CRYS ER 20 MEQ PO TBCR
40.0000 meq | EXTENDED_RELEASE_TABLET | Freq: Once | ORAL | Status: AC
Start: 2016-06-17 — End: 2016-06-17
  Administered 2016-06-17: 40 meq via ORAL
  Filled 2016-06-17: qty 2

## 2016-06-17 MED ORDER — SCOPOLAMINE 1 MG/3DAYS TD PT72
MEDICATED_PATCH | TRANSDERMAL | Status: DC | PRN
  Administered 2016-06-17: 1 via TRANSDERMAL

## 2016-06-17 MED ORDER — HYDROCODONE-ACETAMINOPHEN 5-325 MG PO TABS: 1.0000 | ORAL_TABLET | ORAL | Status: DC | PRN

## 2016-06-17 MED ORDER — DIPHENHYDRAMINE HCL 12.5 MG/5ML PO ELIX: 12.5000 mg | ORAL_SOLUTION | Freq: Four times a day (QID) | ORAL | Status: DC | PRN

## 2016-06-17 MED ORDER — FENTANYL CITRATE (PF) 100 MCG/2ML IJ SOLN
INTRAMUSCULAR | Status: DC | PRN
  Administered 2016-06-17 (×5): 50 ug via INTRAVENOUS

## 2016-06-17 MED ORDER — SCOPOLAMINE 1 MG/3DAYS TD PT72
MEDICATED_PATCH | TRANSDERMAL | Status: AC
  Filled 2016-06-17: qty 1

## 2016-06-17 MED ORDER — PROPOFOL 10 MG/ML IV BOLUS
INTRAVENOUS | Status: AC
  Filled 2016-06-17: qty 20

## 2016-06-17 MED ORDER — OXYCODONE HCL 5 MG PO TABS
5.0000 mg | ORAL_TABLET | ORAL | Status: DC | PRN
  Administered 2016-06-18: 5 mg via ORAL
  Filled 2016-06-17: qty 1

## 2016-06-17 MED ORDER — LACTATED RINGERS IV SOLN
INTRAVENOUS | Status: DC | PRN
  Administered 2016-06-17 (×2): via INTRAVENOUS

## 2016-06-17 MED ORDER — LIDOCAINE 2% (20 MG/ML) 5 ML SYRINGE
INTRAMUSCULAR | Status: AC
  Filled 2016-06-17: qty 5

## 2016-06-17 MED ORDER — BUPIVACAINE LIPOSOME 1.3 % IJ SUSP
INTRAMUSCULAR | Status: DC | PRN
  Administered 2016-06-17: 20 mL

## 2016-06-17 MED ORDER — VANCOMYCIN HCL IN DEXTROSE 1-5 GM/200ML-% IV SOLN
INTRAVENOUS | Status: AC
  Filled 2016-06-17: qty 200

## 2016-06-17 MED ORDER — SENNA 8.6 MG PO TABS
1.0000 | ORAL_TABLET | Freq: Two times a day (BID) | ORAL | Status: DC
  Administered 2016-06-17: 8.6 mg via ORAL
  Filled 2016-06-17: qty 1

## 2016-06-17 MED ORDER — WATER FOR IRRIGATION, STERILE IR SOLN
Status: DC | PRN
  Administered 2016-06-17: 1000 mL

## 2016-06-17 MED ORDER — LIDOCAINE 2% (20 MG/ML) 5 ML SYRINGE
INTRAMUSCULAR | Status: DC | PRN
  Administered 2016-06-17: 100 mg via INTRAVENOUS

## 2016-06-17 MED ORDER — ONDANSETRON HCL 4 MG/2ML IJ SOLN
INTRAMUSCULAR | Status: AC
  Filled 2016-06-17: qty 2

## 2016-06-17 MED ORDER — DIPHENHYDRAMINE HCL 50 MG/ML IJ SOLN: 12.5000 mg | Freq: Four times a day (QID) | INTRAMUSCULAR | Status: DC | PRN

## 2016-06-17 MED ORDER — DEXAMETHASONE SODIUM PHOSPHATE 10 MG/ML IJ SOLN
INTRAMUSCULAR | Status: DC | PRN
  Administered 2016-06-17: 10 mg via INTRAVENOUS

## 2016-06-17 MED ORDER — IOPAMIDOL (ISOVUE-300) INJECTION 61%
INTRAVENOUS | Status: DC | PRN
  Administered 2016-06-17: 10 mL

## 2016-06-17 MED ORDER — SUCCINYLCHOLINE CHLORIDE 200 MG/10ML IV SOSY
PREFILLED_SYRINGE | INTRAVENOUS | Status: AC
  Filled 2016-06-17: qty 10

## 2016-06-17 MED ORDER — ROCURONIUM BROMIDE 10 MG/ML (PF) SYRINGE
PREFILLED_SYRINGE | INTRAVENOUS | Status: DC | PRN
  Administered 2016-06-17: 40 mg via INTRAVENOUS
  Administered 2016-06-17 (×2): 10 mg via INTRAVENOUS

## 2016-06-17 MED ORDER — DOCUSATE SODIUM 100 MG PO CAPS
100.0000 mg | ORAL_CAPSULE | Freq: Two times a day (BID) | ORAL | Status: DC
  Administered 2016-06-17 – 2016-06-18 (×2): 100 mg via ORAL
  Filled 2016-06-17 (×2): qty 1

## 2016-06-17 MED ORDER — DICYCLOMINE HCL 10 MG PO CAPS
20.0000 mg | ORAL_CAPSULE | Freq: Three times a day (TID) | ORAL | Status: DC
  Administered 2016-06-17 – 2016-06-18 (×3): 20 mg via ORAL
  Filled 2016-06-17 (×3): qty 2

## 2016-06-17 MED ORDER — SUGAMMADEX SODIUM 200 MG/2ML IV SOLN
INTRAVENOUS | Status: DC | PRN
  Administered 2016-06-17: 140 mg via INTRAVENOUS

## 2016-06-17 MED ORDER — STERILE WATER FOR IRRIGATION IR SOLN
Status: DC | PRN
  Administered 2016-06-17: 1000 mL

## 2016-06-17 MED ORDER — BUPIVACAINE LIPOSOME 1.3 % IJ SUSP
INTRAMUSCULAR | Status: AC
  Filled 2016-06-17: qty 20

## 2016-06-17 MED ORDER — ROCURONIUM BROMIDE 50 MG/5ML IV SOSY
PREFILLED_SYRINGE | INTRAVENOUS | Status: AC
  Filled 2016-06-17: qty 5

## 2016-06-17 MED ORDER — SUGAMMADEX SODIUM 200 MG/2ML IV SOLN
INTRAVENOUS | Status: AC
  Filled 2016-06-17: qty 2

## 2016-06-17 MED ORDER — OXYCODONE HCL 5 MG PO TABS: 10.0000 mg | ORAL_TABLET | ORAL | Status: DC | PRN

## 2016-06-17 MED ORDER — AMLODIPINE BESYLATE 10 MG PO TABS
10.0000 mg | ORAL_TABLET | Freq: Every day | ORAL | Status: DC
  Administered 2016-06-18: 10 mg via ORAL
  Filled 2016-06-17: qty 1

## 2016-06-17 MED ORDER — ONDANSETRON HCL 4 MG/2ML IJ SOLN: 4.0000 mg | INTRAMUSCULAR | Status: DC | PRN

## 2016-06-17 MED ORDER — KCL IN DEXTROSE-NACL 20-5-0.45 MEQ/L-%-% IV SOLN
INTRAVENOUS | Status: DC
  Administered 2016-06-17 – 2016-06-18 (×3): via INTRAVENOUS
  Filled 2016-06-17 (×4): qty 1000

## 2016-06-17 MED ORDER — ONDANSETRON HCL 4 MG/2ML IJ SOLN
INTRAMUSCULAR | Status: DC | PRN
  Administered 2016-06-17: 4 mg via INTRAVENOUS

## 2016-06-17 MED ORDER — MORPHINE SULFATE (PF) 2 MG/ML IV SOLN: 2.0000 mg | INTRAVENOUS | Status: DC | PRN

## 2016-06-17 MED ORDER — PROPOFOL 10 MG/ML IV BOLUS
INTRAVENOUS | Status: DC | PRN
  Administered 2016-06-17: 150 mg via INTRAVENOUS

## 2016-06-17 MED ORDER — TAMSULOSIN HCL 0.4 MG PO CAPS
0.4000 mg | ORAL_CAPSULE | Freq: Every day | ORAL | Status: DC
  Administered 2016-06-18: 0.4 mg via ORAL
  Filled 2016-06-17: qty 1

## 2016-06-17 MED ORDER — ATORVASTATIN CALCIUM 10 MG PO TABS
20.0000 mg | ORAL_TABLET | Freq: Every day | ORAL | Status: DC
  Administered 2016-06-17: 20 mg via ORAL
  Filled 2016-06-17: qty 2

## 2016-06-17 MED ORDER — LACTATED RINGERS IR SOLN
Status: DC | PRN
  Administered 2016-06-17: 3000 mL

## 2016-06-17 MED ORDER — PANTOPRAZOLE SODIUM 40 MG PO TBEC
40.0000 mg | DELAYED_RELEASE_TABLET | Freq: Every day | ORAL | Status: DC
  Administered 2016-06-18: 40 mg via ORAL
  Filled 2016-06-17: qty 1

## 2016-06-17 MED ORDER — OXYCODONE HCL 5 MG/5ML PO SOLN
5.0000 mg | Freq: Once | ORAL | Status: DC | PRN
  Filled 2016-06-17: qty 5

## 2016-06-17 MED ORDER — OXYCODONE HCL 5 MG PO TABS: ORAL_TABLET | ORAL | 0 refills | Status: DC

## 2016-06-17 MED ORDER — HYDROMORPHONE HCL 1 MG/ML IJ SOLN: 0.2500 mg | INTRAMUSCULAR | Status: DC | PRN

## 2016-06-17 MED ORDER — MIDAZOLAM HCL 2 MG/2ML IJ SOLN
INTRAMUSCULAR | Status: AC
  Filled 2016-06-17: qty 2

## 2016-06-17 MED ORDER — SODIUM CHLORIDE 0.9 % IJ SOLN
INTRAMUSCULAR | Status: DC | PRN
  Administered 2016-06-17: 20 mL

## 2016-06-17 MED ORDER — OXYCODONE HCL 5 MG PO TABS: 5.0000 mg | ORAL_TABLET | Freq: Once | ORAL | Status: DC | PRN

## 2016-06-17 MED ORDER — CEFAZOLIN IN D5W 1 GM/50ML IV SOLN
1.0000 g | Freq: Three times a day (TID) | INTRAVENOUS | Status: AC
  Administered 2016-06-17 (×2): 1 g via INTRAVENOUS
  Filled 2016-06-17 (×2): qty 50

## 2016-06-17 MED ORDER — DEXAMETHASONE SODIUM PHOSPHATE 10 MG/ML IJ SOLN
INTRAMUSCULAR | Status: AC
  Filled 2016-06-17: qty 1

## 2016-06-17 MED ORDER — VANCOMYCIN HCL IN DEXTROSE 1-5 GM/200ML-% IV SOLN
1000.0000 mg | INTRAVENOUS | Status: AC
  Administered 2016-06-17: 1000 mg via INTRAVENOUS

## 2016-06-17 SURGICAL SUPPLY — 58 items
ADH SKN CLS APL DERMABOND .7 (GAUZE/BANDAGES/DRESSINGS) ×3
BAG URO CATCHER STRL LF (MISCELLANEOUS) ×4 IMPLANT
CATH INTERMIT  6FR 70CM (CATHETERS) ×4 IMPLANT
CHLORAPREP W/TINT 26ML (MISCELLANEOUS) ×4 IMPLANT
CLIP LIGATING HEM O LOK PURPLE (MISCELLANEOUS) ×3 IMPLANT
CLIP LIGATING HEMO O LOK GREEN (MISCELLANEOUS) ×3 IMPLANT
CLOTH BEACON ORANGE TIMEOUT ST (SAFETY) ×4 IMPLANT
COVER TIP SHEARS 8 DVNC (MISCELLANEOUS) ×3 IMPLANT
COVER TIP SHEARS 8MM DA VINCI (MISCELLANEOUS) ×1
DECANTER SPIKE VIAL GLASS SM (MISCELLANEOUS) ×1 IMPLANT
DERMABOND ADVANCED (GAUZE/BANDAGES/DRESSINGS) ×1
DERMABOND ADVANCED .7 DNX12 (GAUZE/BANDAGES/DRESSINGS) ×3 IMPLANT
DRAIN CHANNEL 15F RND FF 3/16 (WOUND CARE) ×4 IMPLANT
DRAPE ARM DVNC X/XI (DISPOSABLE) ×12 IMPLANT
DRAPE COLUMN DVNC XI (DISPOSABLE) ×3 IMPLANT
DRAPE DA VINCI XI ARM (DISPOSABLE) ×4
DRAPE DA VINCI XI COLUMN (DISPOSABLE) ×1
DRAPE INCISE IOBAN 66X45 STRL (DRAPES) ×4 IMPLANT
DRAPE LAPAROSCOPIC ABDOMINAL (DRAPES) ×1 IMPLANT
DRAPE SHEET LG 3/4 BI-LAMINATE (DRAPES) ×4 IMPLANT
DRSG TEGADERM 4X4.75 (GAUZE/BANDAGES/DRESSINGS) ×3 IMPLANT
ELECT PENCIL ROCKER SW 15FT (MISCELLANEOUS) ×4 IMPLANT
ELECT REM PT RETURN 9FT ADLT (ELECTROSURGICAL) ×4
ELECTRODE REM PT RTRN 9FT ADLT (ELECTROSURGICAL) ×3 IMPLANT
EVACUATOR SILICONE 100CC (DRAIN) ×4 IMPLANT
GLOVE BIO SURGEON STRL SZ 6.5 (GLOVE) ×1 IMPLANT
GLOVE BIOGEL M STRL SZ7.5 (GLOVE) ×3 IMPLANT
GOWN STRL REUS W/TWL LRG LVL3 (GOWN DISPOSABLE) ×28 IMPLANT
GUIDEWIRE STR DUAL SENSOR (WIRE) ×4 IMPLANT
IRRIG SUCT STRYKERFLOW 2 WTIP (MISCELLANEOUS) ×4
IRRIGATION SUCT STRKRFLW 2 WTP (MISCELLANEOUS) ×2 IMPLANT
KIT BASIN OR (CUSTOM PROCEDURE TRAY) ×4 IMPLANT
MANIFOLD NEPTUNE II (INSTRUMENTS) ×4 IMPLANT
NS IRRIG 1000ML POUR BTL (IV SOLUTION) ×1 IMPLANT
PACK CYSTO (CUSTOM PROCEDURE TRAY) ×4 IMPLANT
POSITIONER SURGICAL ARM (MISCELLANEOUS) ×8 IMPLANT
SEAL CANN UNIV 5-8 DVNC XI (MISCELLANEOUS) ×12 IMPLANT
SEAL XI 5MM-8MM UNIVERSAL (MISCELLANEOUS) ×4
SOLUTION ELECTROLUBE (MISCELLANEOUS) ×4 IMPLANT
STENT CONTOUR NO GW 8FR 26CM (STENTS) ×3 IMPLANT
SUT ETHILON 3 0 PS 1 (SUTURE) ×4 IMPLANT
SUT MNCRL AB 4-0 PS2 18 (SUTURE) ×8 IMPLANT
SUT SILK 3 0 SH 30 (SUTURE) ×3 IMPLANT
SUT VIC AB 0 CT1 27 (SUTURE) ×4
SUT VIC AB 0 CT1 27XBRD ANTBC (SUTURE) ×5 IMPLANT
SUT VIC AB 0 UR5 27 (SUTURE) ×2 IMPLANT
SUT VIC AB 4-0 RB1 27 (SUTURE) ×24
SUT VIC AB 4-0 RB1 27XBRD (SUTURE) ×18 IMPLANT
SUT VICRYL 0 UR6 27IN ABS (SUTURE) ×2 IMPLANT
SYR BULB IRRIGATION 50ML (SYRINGE) IMPLANT
TOWEL OR 17X26 10 PK STRL BLUE (TOWEL DISPOSABLE) ×4 IMPLANT
TOWEL OR NON WOVEN STRL DISP B (DISPOSABLE) ×8 IMPLANT
TRAY FOLEY CATH SILVER 16FR LF (SET/KITS/TRAYS/PACK) ×3 IMPLANT
TRAY FOLEY W/METER SILVER 16FR (SET/KITS/TRAYS/PACK) ×1 IMPLANT
TRAY LAPAROSCOPIC (CUSTOM PROCEDURE TRAY) ×4 IMPLANT
TROCAR XCEL 12X100 BLDLESS (ENDOMECHANICALS) ×4 IMPLANT
TUBING CONNECTING 10 (TUBING) ×4 IMPLANT
WATER STERILE IRR 1500ML POUR (IV SOLUTION) ×1 IMPLANT

## 2016-06-17 NOTE — Plan of Care (Signed)
Problem: Education: Goal: Knowledge of [facility] Education information/materials will improve Outcome: Completed/Met Date Met: 06/17/16 Discussed continued plan of care with patients daughter and grandson

## 2016-06-17 NOTE — Progress Notes (Signed)
Day of Surgery Post Op Check  Subjective: The patient is doing well.  No nausea or vomiting. Pain is adequately controlled, currently sleeping. Awaiting KUB.  Objective: Vital signs in last 24 hours: Temp:  [97.9 F (36.6 C)-98.5 F (36.9 C)] 98.5 F (36.9 C) (01/08 1319) Pulse Rate:  [60-72] 72 (01/08 1319) Resp:  [11-18] 14 (01/08 1319) BP: (120-145)/(77-88) 145/77 (01/08 1319) SpO2:  [100 %] 100 % (01/08 1319) Weight:  [69.4 kg (153 lb)] 69.4 kg (153 lb) (01/08 0552)  Intake/Output from previous day: No intake/output data recorded. Intake/Output this shift: Total I/O In: 1000 [I.V.:1000] Out: 352 [Urine:300; Drains:2; Blood:50]  Physical Exam:  General: Alert and oriented. CV: RRR Lungs: Clear bilaterally. GI: Soft, Nondistended. Incisions: Clean and dry, JP with scant SS fluid Urine: Clear with >245ml in bag Extremities: Nontender, no erythema, no edema.  Lab Results:  Recent Labs  06/17/16 1200  HGB 12.8  HCT 39.1          Recent Labs  06/11/16 1130 06/17/16 1200  CREATININE 0.66 0.98           Results for orders placed or performed during the hospital encounter of 06/17/16 (from the past 24 hour(s))  Basic metabolic panel     Status: Abnormal   Collection Time: 06/17/16 12:00 PM  Result Value Ref Range   Sodium 141 135 - 145 mmol/L   Potassium 3.1 (L) 3.5 - 5.1 mmol/L   Chloride 104 101 - 111 mmol/L   CO2 29 22 - 32 mmol/L   Glucose, Bld 145 (H) 65 - 99 mg/dL   BUN 12 6 - 20 mg/dL   Creatinine, Ser 0.98 0.44 - 1.00 mg/dL   Calcium 8.8 (L) 8.9 - 10.3 mg/dL   GFR calc non Af Amer >60 >60 mL/min   GFR calc Af Amer >60 >60 mL/min   Anion gap 8 5 - 15  Hemoglobin and hematocrit, blood     Status: None   Collection Time: 06/17/16 12:00 PM  Result Value Ref Range   Hemoglobin 12.8 12.0 - 15.0 g/dL   HCT 39.1 36.0 - 46.0 %    Assessment/Plan: POD# 0 s/p robotic left pyeloplasty doing well.  1) Ambulate, Incentive spirometry 2) Clear liquid  diet, replete potassium 3) Oral/IV pain meds 4) Follow JP output 5) Continue urethral catheter until AM    LOS: 0 days   Lolita Rieger 06/17/2016, 2:50 PM

## 2016-06-17 NOTE — Progress Notes (Signed)
Pt very sleepy/sedated even with painful stimulation. Was able to speak her name after repeated requests and was able to partially follow command to open her eyes. Responding appropriately to questions by nodding/moaning sleepily.  Language barrier causes difficulty in assessing response to commands, but when interpreter was at bedside pt appeared to respond appropriately, while still appearing very sleepy.  Family states pt has had "trouble with anesthesia" in the past, very sleepy and nauseated after previous experiences.  Report given to [employer], Therapist, sports, at bedside.  Pt's vital signs stable.  Appears to be resting comfortably.  Coolidge Breeze, RN 06/17/2016

## 2016-06-17 NOTE — Discharge Summary (Signed)
Date of admission: 06/17/2016  Date of discharge: 06/18/2016  Admission diagnosis: Left UPJ obstruction  Discharge diagnosis: Left UPJ obstruction  History and Physical: For full details, please see admission history and physical. Briefly, Jacqueline Orozco is a 66 y.o. female with symptomatic left UPJ obstruction secondary to lower pole crossing vessel.  After discussing management/treatment options, he elected to proceed with surgical treatment.  Hospital Course: Jacqueline Orozco was taken to the operating room on 06/17/2016 and underwent a robotic assisted laparoscopic left dismembered pyeloplasty. Jacqueline Orozco tolerated this procedure well and without complications. Postoperatively, Jacqueline Orozco was able to be transferred to a regular hospital room following recovery from anesthesia.  Jacqueline Orozco was able to begin ambulating the night of surgery. He remained hemodynamically stable overnight.  Jacqueline Orozco had excellent urine output overnight and Jacqueline Orozco foley catheter was removed on POD#1. Jacqueline Orozco also had appropriately minimal output from Jacqueline Orozco pelvic drain and Jacqueline Orozco pelvic drain was sent for creatinine level, which returned consistent with serum creatinine. Jacqueline Orozco drain was then removed on POD #1. Jacqueline Orozco was passing flatus at the time of discharge. Jacqueline Orozco was transitioned to oral pain medication, tolerated a regular diet, and had met all discharge criteria and was able to be discharged home later on POD#1.  Laboratory values:   Recent Labs  06/17/16 1200 06/18/16 0523  HGB 12.8 12.5  HCT 39.1 37.5    Disposition: Home  Discharge instruction: Jacqueline Orozco was instructed to be ambulatory but to refrain from heavy lifting, strenuous activity, or driving.  Discharge medications:   Allergies as of 06/18/2016      Reactions   Aspirin Other (See Comments)   stomach pain, stomach bleeding   Penicillins Nausea And Vomiting, Other (See Comments)   Dizzy Has patient had a PCN reaction causing immediate rash, facial/tongue/throat swelling, SOB or lightheadedness with  hypotension: No Has patient had a PCN reaction causing severe rash involving mucus membranes or skin necrosis: No Has patient had a PCN reaction that required hospitalization; No Has patient had a PCN reaction occurring within the last 10 years: No If all of the above answers are "NO", then may proceed with Cephalosporin use.   Latex Itching   Streptomycin Nausea And Vomiting, Rash   Tramadol Nausea Only      Medication List    TAKE these medications   acetaminophen 650 MG CR tablet Commonly known as:  TYLENOL 8 HOUR Take 1 tablet (650 mg total) by mouth every 8 (eight) hours as needed for pain.   amLODipine 10 MG tablet Commonly known as:  NORVASC Take 1 tablet (10 mg total) by mouth daily.   atorvastatin 20 MG tablet Commonly known as:  LIPITOR Take 1 tablet (20 mg total) by mouth daily. What changed:  when to take this   dicyclomine 20 MG tablet Commonly known as:  BENTYL Take 1 tablet (20 mg total) by mouth 3 (three) times daily before meals.   hydrochlorothiazide 25 MG tablet Commonly known as:  HYDRODIURIL Take 1 tablet (25 mg total) by mouth daily.   omeprazole 40 MG capsule Commonly known as:  PRILOSEC Take 1 capsule (40 mg total) by mouth daily.   oxyCODONE 5 MG immediate release tablet Commonly known as:  ROXICODONE Take 1-2 tablets every 4-6 hours as needed for pain.   phenazopyridine 100 MG tablet Commonly known as:  PYRIDIUM Take 1 tablet (100 mg total) by mouth 3 (three) times daily as needed for pain.   polyethylene glycol powder powder Commonly known as:  GLYCOLAX/MIRALAX Take 17 g  by mouth daily.   tamsulosin 0.4 MG Caps capsule Commonly known as:  FLOMAX Take 1 capsule (0.4 mg total) by mouth daily.       Followup: Jacqueline Orozco will followup for stent removal and post-op check.

## 2016-06-17 NOTE — Discharge Instructions (Signed)

## 2016-06-17 NOTE — Op Note (Signed)
Preoperative diagnosis: Left ureteropelvic junction obstruction  Postoperative diagnosis: Left ureteropelvic junction obstruction  Procedure:  1. Cystoscopy 2. Left retrograde pyelography with interpretation 3. Left ureteral stent placement (6Fr x 26cm JJ without dangler) 4. Left robotic-assisted laparoscopic dismembered pyeloplasty  Surgeon: Pryor Curia. M.D.  Assistant(s): Debbrah Alar, [place]  Resident: Virginia Crews, MD  Anesthesia: General  Complications: None  EBL: 100 mL  IVF:  1000 mL crystalloid  Specimens: 1. Ureteropelvic junction  Disposition of specimens: Pathology  Intraoperative findings: UPJ obstruction secondary to crossing lower pole artery and vein. Stenotic segment excised and transposition of renal pelvis anterior to obstructing vessels.  Interpretation of left retrograde pyelogram: Mild left renal collecting system dilation with crisp calyces and no filling defects. Normal caliber ureter.  Drains:  1. # 15 Blake perinephric drain 2. 16 Fr Foley catheter  Indication: Jacqueline Orozco is a 66 y.o. patient with a suspected left ureteropelvic junction obstruction.  After a thorough review of the management options for their ureteropelvic junction obstruction, they elected to proceed with surgical treatment and the above procedure.  We have discussed the potential benefits and risks of the procedure, side effects of the proposed treatment, the likelihood of the patient achieving the goals of the procedure, and any potential problems that might occur during the procedure or recuperation. Informed consent has been obtained.  Description of procedure:  The patient was taken to the operating room and a general anesthetic was administered. The patient was given preoperative antibiotics, placed in the dorsal lithotomy position, and prepped and draped in the usual sterile fashion. Next a preoperative timeout was performed.  Cystourethroscopy was performed.  The  patient's urethra was examined and was normal. The bladder was then systematically examined in its entirety. There was no evidence for any bladder tumors, stones, or other mucosal pathology.    Attention then turned to the left ureteral orifice and a ureteral catheter was used to intubate the ureteral orifice.  Omnipaque contrast was injected through the ureteral catheter and a retrograde pyelogram was performed with findings as dictated above.  A 0.38 sensor guidewire was then advanced up the left ureter into the renal pelvis under fluoroscopic guidance.  The wire was then backloaded through the cystoscope and a ureteral stent was advance over the wire using Seldinger technique.  The 6 x 26 cm JJ stent was positioned appropriately under fluoroscopic and cystoscopic guidance.  The wire was then removed with an adequate stent curl noted in the renal pelvis as well as in the bladder. The bladder was then emptied and a 16 Fr Foley catheter was inserted.   The patient was then repositioned in the left modified flank position and prepped and draped in the usual sterile fashion. A site was selected on the left side of the umbilicus for placement of the camera port. This was placed using a standard open Hassan technique which allowed entry into the peritoneal cavity under direct vision and without difficulty. A 12 mm port was placed and a pneumoperitoneum established. The camera was then used to inspect the abdomen and there was no evidence of any intra-abdominal injuries or other abnormalities. The remaining abdominal ports were then placed. 8 mm robotic ports were placed in the ipsilateral upper quadrant, lower quadrant, and far lateral abdominal wall.  All ports were placed under direct vision without difficulty. The surgical cart was then docked.   Utilizing the cautery scissors, the white line of Toldt was incised allowing the plane between the mesocolon and  the anterior layer of Gerota's fascia to be  developed and the kidney exposed.  The ureter and gonadal vein were identified inferiorly and the ureter was lifted anteriorly off the psoas muscle.  There was slight entry into the gonadal vein and decision was made to ligate with after placing clips proximally and distally to control bleeding. We then identified the ureter posterior to the gonadal vein.  The gonadal vein was ligated with Weck clips and divided. Dissection proceeded superiorly along the ureter until the main renal hilum and renal pelvis was identified. The ureteropelvic junction was isolated from the surrounding structures with a combination of sharp and blunt dissection.    There was noted to be two lower pole crossing renal vessels, with both and artery and vein. These were seen just inferior to the main renal hilum. Both of these vessels were isolated away from the renal pelvis. We then placed a holding stitch in the renal pelvis. The ureteropelvic junction was divided allowing exposure of the indwelling stent and the ureteropelvic junction was excised and removed. The ureter and renal pelvis were then transposed anterior to the crossing lower pole renal vessels.  The ureter and renal pelvis were then examined.  There was no excess renal pelvis to excised so then the renal pelvis and ureter were then spatulated appropriately.  We used 4-0 Vicryl sutures to perform interrupted sutures at both ends of our anastomosis and to provide a tag to hold. 4-0 vicryl sutures were then used to reapproximate the renal pelvis and ureter in a running fashion, first on the posterior aspect, then on the anterior aspect.  The anastomosis was performed in a tension-free, watertight fashion with the ureter reconnected to the renal pelvis in a position to provide dependent drainage of the renal collecting system. The ureteral stent was repositioned appropriately prior to securing the final sutures of the ureteropelvic anastomosis.  A # 15 Blake perinephric  drain was then brought through the lateral lower abdominal port site and positioned appropriately.  It was secured to the skin with a nylon suture.  The 12 mm port site were then closed with 0-vicryl sutures placed laparoscopically with the laparoscopic suture passer. All remaining ports were removed under direct vision after hemostasis was confirmed with the pneumoperiotneum let down. All port sites were injected with Exparel and reapproximated at the skin level with 4-0 monocryl subcuticular sutures. Dermabond was applied to the skin.  The patient appeared to tolerate the procedure well and without complications.  The patient was able to be extubated and transferred to the recovery unit in satisfactory condition.   Dr. Alinda Money was present for the entire procedure.

## 2016-06-17 NOTE — Interval H&P Note (Signed)
History and Physical Interval Note:  06/17/2016 7:04 AM  Jacqueline Orozco  has presented today for surgery, with the diagnosis of LEFT URETEROPELVIC JUNCTION OBSTRUCTION  The various methods of treatment have been discussed with the patient and family. After consideration of risks, benefits and other options for treatment, the patient has consented to  Procedure(s): XI ROBOTIC ASSISTED PYELOPLASTY WITH STENT PLACEMENT (Left) CYSTOSCOPY (N/A) as a surgical intervention .  The patient's history has been reviewed, patient examined, no change in status, stable for surgery.  I have reviewed the patient's chart and labs.  Questions were answered to the patient's satisfaction.  I discussed the potential benefits and risks of the procedure, side effects of the proposed treatment, the likelihood of the patient achieving the goals of the procedure, and any potential problems that might occur during the procedure or recuperation. She gives informed consent to proceed.  All discussion were held with the assistance of a professional interpreter.   Emonee Winkowski,LES

## 2016-06-17 NOTE — Progress Notes (Signed)
Patient arrived to unit with [place] interpreter. Patient assessed, very lethargic. VS are stable, see flow sheet. Pt was only arousable to pain. PACU RN at bedside, whom reports pt responded appropriately during observation. After speaking to patients family pt has had "problems with anesthesia" in the past. Will continue to monitor patient.

## 2016-06-17 NOTE — Anesthesia Procedure Notes (Signed)
Procedure Name: Intubation Date/Time: 06/17/2016 7:24 AM Performed by: Lind Covert Pre-anesthesia Checklist: Patient identified, Emergency Drugs available, Suction available, Patient being monitored and Timeout performed Patient Re-evaluated:Patient Re-evaluated prior to inductionOxygen Delivery Method: Circle system utilized Preoxygenation: Pre-oxygenation with 100% oxygen Intubation Type: IV induction Ventilation: Mask ventilation without difficulty Laryngoscope Size: Mac and 3 Grade View: Grade I Tube type: Oral Tube size: 7.5 mm Number of attempts: 1 Airway Equipment and Method: Stylet Placement Confirmation: ETT inserted through vocal cords under direct vision,  positive ETCO2 and breath sounds checked- equal and bilateral Secured at: 21 cm Tube secured with: Tape Dental Injury: Teeth and Oropharynx as per pre-operative assessment

## 2016-06-17 NOTE — Transfer of Care (Signed)
Immediate Anesthesia Transfer of Care Note  Patient: Chinelo Clift  Procedure(s) Performed: Procedure(s): XI ROBOTIC ASSISTED PYELOPLASTY (Left) CYSTOSCOPY WITH RETROGRADE PYELOGRAM/URETERAL STENT PLACEMENT (Left)  Patient Location: PACU  Anesthesia Type:General  Level of Consciousness: sedated  Airway & Oxygen Therapy: Patient Spontanous Breathing and Patient connected to face mask oxygen  Post-op Assessment: Report given to RN and Post -op Vital signs reviewed and stable  Post vital signs: Reviewed and stable  Last Vitals:  Vitals:   06/17/16 0542  BP: 120/83  Pulse: 66  Resp: 18  Temp: 36.6 C    Last Pain:  Vitals:   06/17/16 0542  TempSrc: Oral      Patients Stated Pain Goal: 4 ([phone] [id number])  Complications: No apparent anesthesia complications

## 2016-06-17 NOTE — Anesthesia Preprocedure Evaluation (Signed)
Anesthesia Evaluation  Patient identified by MRN, date of birth, ID band Patient awake    Reviewed: Allergy & Precautions, NPO status , Patient's Chart, lab work & pertinent test results  History of Anesthesia Complications (+) PONV  Airway Mallampati: II  TM Distance: >3 FB Neck ROM: Full    Dental no notable dental hx.    Pulmonary neg pulmonary ROS,    Pulmonary exam normal breath sounds clear to auscultation       Cardiovascular hypertension, Normal cardiovascular exam Rhythm:Regular Rate:Normal     Neuro/Psych negative neurological ROS  negative psych ROS   GI/Hepatic negative GI ROS, (+) Hepatitis -, B  Endo/Other  negative endocrine ROS  Renal/GU negative Renal ROS  negative genitourinary   Musculoskeletal negative musculoskeletal ROS (+)   Abdominal   Peds negative pediatric ROS (+)  Hematology negative hematology ROS (+)   Anesthesia Other Findings   Reproductive/Obstetrics negative OB ROS                             Anesthesia Physical Anesthesia Plan  ASA: III  Anesthesia Plan: General   Post-op Pain Management:    Induction: Intravenous  Airway Management Planned: Oral ETT  Additional Equipment:   Intra-op Plan:   Post-operative Plan: Extubation in OR  Informed Consent: I have reviewed the patients History and Physical, chart, labs and discussed the procedure including the risks, benefits and alternatives for the proposed anesthesia with the patient or authorized representative who has indicated his/her understanding and acceptance.   Dental advisory given  Plan Discussed with: CRNA and Surgeon  Anesthesia Plan Comments:         Anesthesia Quick Evaluation

## 2016-06-17 NOTE — Anesthesia Postprocedure Evaluation (Signed)
Anesthesia Post Note  Patient: Jacqueline Orozco  Procedure(s) Performed: Procedure(s) (LRB): XI ROBOTIC ASSISTED PYELOPLASTY (Left) CYSTOSCOPY WITH RETROGRADE PYELOGRAM/URETERAL STENT PLACEMENT (Left)  Patient location during evaluation: PACU Anesthesia Type: General Level of consciousness: sedated and patient cooperative Pain management: pain level controlled Vital Signs Assessment: post-procedure vital signs reviewed and stable Respiratory status: spontaneous breathing Cardiovascular status: stable Anesthetic complications: no       Last Vitals:  Vitals:   06/17/16 1245 06/17/16 1319  BP: 138/85 (!) 145/77  Pulse: 71 72  Resp: 12 14  Temp:  36.9 C    Last Pain:  Vitals:   06/17/16 1245  TempSrc:   PainSc: Asleep                 Nolon Nations

## 2016-06-18 LAB — CREATININE, FLUID (PLEURAL, PERITONEAL, JP DRAINAGE): Creat, Fluid: 0.8 mg/dL

## 2016-06-18 LAB — BASIC METABOLIC PANEL
Anion gap: 7 (ref 5–15)
BUN: 6 mg/dL (ref 6–20)
CALCIUM: 8.8 mg/dL — AB (ref 8.9–10.3)
CO2: 24 mmol/L (ref 22–32)
CREATININE: 0.63 mg/dL (ref 0.44–1.00)
Chloride: 109 mmol/L (ref 101–111)
Glucose, Bld: 161 mg/dL — ABNORMAL HIGH (ref 65–99)
Potassium: 3.5 mmol/L (ref 3.5–5.1)
SODIUM: 140 mmol/L (ref 135–145)

## 2016-06-18 LAB — HEMOGLOBIN AND HEMATOCRIT, BLOOD
HEMATOCRIT: 37.5 % (ref 36.0–46.0)
HEMOGLOBIN: 12.5 g/dL (ref 12.0–15.0)

## 2016-06-18 MED ORDER — BISACODYL 10 MG RE SUPP
10.0000 mg | Freq: Once | RECTAL | Status: AC
  Administered 2016-06-18: 10 mg via RECTAL
  Filled 2016-06-18: qty 1

## 2016-06-18 MED ORDER — KETOROLAC TROMETHAMINE 15 MG/ML IJ SOLN
15.0000 mg | Freq: Once | INTRAMUSCULAR | Status: AC
  Administered 2016-06-18: 15 mg via INTRAVENOUS
  Filled 2016-06-18: qty 1

## 2016-06-18 NOTE — Progress Notes (Signed)
1 Day Post-Op Subjective: The patient is doing well.  No nausea or vomiting. Pain is adequately controlled. Hb stable, creatinine at baseline. Excellent UOP (2.6L) with only 21ml from drain. Ambulated yesterday with slight dizziness and nausea. No nausea this AM. No flatus yet.  Objective: Vital signs in last 24 hours: Temp:  [98 F (36.7 C)-99.8 F (37.7 C)] 99.8 F (37.7 C) (01/09 0428) Pulse Rate:  [60-81] 71 (01/09 0428) Resp:  [11-16] 16 (01/09 0428) BP: (119-145)/(71-88) 119/71 (01/09 0428) SpO2:  [99 %-100 %] 99 % (01/09 0428)  Intake/Output from previous day: 01/08 0701 - 01/09 0700 In: 5140 [P.O.:340; I.V.:2700; IV Piggyback:350] Out: 2682 [Urine:2600; Drains:32; Blood:50] Intake/Output this shift: No intake/output data recorded.  Physical Exam:  General: Alert and oriented. CV: RRR Lungs: Clear bilaterally. GI: Soft, Nondistended. Incisions: Clean and dry, JP with scant SS fluid Urine: Clear Extremities: Nontender, no erythema, no edema.  Lab Results:  Recent Labs  06/17/16 1200 06/18/16 0523  HGB 12.8 12.5  HCT 39.1 37.5          Recent Labs  06/11/16 1130 06/17/16 1200 06/18/16 0523  CREATININE 0.66 0.98 0.63           Results for orders placed or performed during the hospital encounter of 06/17/16 (from the past 24 hour(s))  Basic metabolic panel     Status: Abnormal   Collection Time: 06/17/16 12:00 PM  Result Value Ref Range   Sodium 141 135 - 145 mmol/L   Potassium 3.1 (L) 3.5 - 5.1 mmol/L   Chloride 104 101 - 111 mmol/L   CO2 29 22 - 32 mmol/L   Glucose, Bld 145 (H) 65 - 99 mg/dL   BUN 12 6 - 20 mg/dL   Creatinine, Ser 0.98 0.44 - 1.00 mg/dL   Calcium 8.8 (L) 8.9 - 10.3 mg/dL   GFR calc non Af Amer >60 >60 mL/min   GFR calc Af Amer >60 >60 mL/min   Anion gap 8 5 - 15  Hemoglobin and hematocrit, blood     Status: None   Collection Time: 06/17/16 12:00 PM  Result Value Ref Range   Hemoglobin 12.8 12.0 - 15.0 g/dL   HCT 39.1 36.0 -  [id number] %  Basic metabolic panel     Status: Abnormal   Collection Time: 06/18/16  5:23 AM  Result Value Ref Range   Sodium 140 135 - 145 mmol/L   Potassium 3.5 3.5 - 5.1 mmol/L   Chloride 109 101 - 111 mmol/L   CO2 24 22 - 32 mmol/L   Glucose, Bld 161 (H) 65 - 99 mg/dL   BUN 6 6 - 20 mg/dL   Creatinine, Ser 0.63 0.44 - 1.00 mg/dL   Calcium 8.8 (L) 8.9 - 10.3 mg/dL   GFR calc non Af Amer >60 >60 mL/min   GFR calc Af Amer >60 >60 mL/min   Anion gap 7 5 - 15  Hemoglobin and hematocrit, blood     Status: None   Collection Time: 06/18/16  5:23 AM  Result Value Ref Range   Hemoglobin 12.5 12.0 - 15.0 g/dL   HCT 37.5 36.0 - 46.0 %    Assessment/Plan: POD# 1 s/p robotic left pyeloplasty.  1) Ambulate, Incentive spirometry 2) Advance diet as tolerated 3) Transition to oral pain medication 4) Dulcolax suppository 5) D/C urethral catheter 6) d/c JP drain 7) Likely d/c later today   LOS: 1 day   Milford Cage Tempestt Silba 06/18/2016, 7:25 AM

## 2016-06-20 ENCOUNTER — Encounter (HOSPITAL_COMMUNITY): Payer: Self-pay | Admitting: Urology

## 2016-06-21 ENCOUNTER — Other Ambulatory Visit: Payer: Self-pay | Admitting: Family Medicine

## 2016-06-21 MED FILL — ?AMLODIPINE BESYLATE 10 MG: 10 | 30 days supply | Qty: 30 | Fill #11

## 2016-06-21 MED FILL — OMEPRAZOLE 40 MG CPDR: 40 | 30 days supply | Qty: 30 | Fill #10

## 2016-06-21 MED FILL — DICYCLOMINE 20 MG TABLET: 20 | 30 days supply | Qty: 90 | Fill #11

## 2016-06-21 MED FILL — HYDROCHLOROTHIAZIDE 25 MG T: 25 | 30 days supply | Qty: 30 | Fill #11

## 2016-06-25 ENCOUNTER — Other Ambulatory Visit: Payer: Self-pay | Admitting: Family Medicine

## 2016-07-02 ENCOUNTER — Encounter: Payer: Self-pay | Admitting: Family Medicine

## 2016-07-02 ENCOUNTER — Ambulatory Visit (HOSPITAL_COMMUNITY)
Admission: RE | Admit: 2016-07-02 | Discharge: 2016-07-02 | Disposition: A | Discharge status: Home / Self Care | Payer: Self-pay | Source: Ambulatory Visit | Attending: Family Medicine | Admitting: Family Medicine

## 2016-07-02 ENCOUNTER — Ambulatory Visit: Payer: Self-pay | Attending: Family Medicine | Admitting: Family Medicine

## 2016-07-02 VITALS — BP 116/76 | HR 68 | Temp 97.5°F | Ht 63.0 in | Wt 154.6 lb

## 2016-07-02 DIAGNOSIS — N135 Crossing vessel and stricture of ureter without hydronephrosis: Secondary | ICD-10-CM

## 2016-07-02 DIAGNOSIS — R05 Cough: Secondary | ICD-10-CM | POA: Insufficient documentation

## 2016-07-02 DIAGNOSIS — N1330 Unspecified hydronephrosis: Secondary | ICD-10-CM | POA: Insufficient documentation

## 2016-07-02 DIAGNOSIS — R059 Cough, unspecified: Secondary | ICD-10-CM

## 2016-07-02 DIAGNOSIS — L231 Allergic contact dermatitis due to adhesives: Secondary | ICD-10-CM

## 2016-07-02 DIAGNOSIS — Z9889 Other specified postprocedural states: Secondary | ICD-10-CM | POA: Insufficient documentation

## 2016-07-02 DIAGNOSIS — Z960 Presence of urogenital implants: Secondary | ICD-10-CM | POA: Insufficient documentation

## 2016-07-02 DIAGNOSIS — R1032 Left lower quadrant pain: Secondary | ICD-10-CM

## 2016-07-02 LAB — COMPLETE METABOLIC PANEL WITH GFR
ALBUMIN: 4.2 g/dL (ref 3.6–5.1)
ALK PHOS: 66 U/L (ref 33–130)
ALT: 31 U/L — AB (ref 6–29)
AST: 26 U/L (ref 10–35)
BILIRUBIN TOTAL: 0.5 mg/dL (ref 0.2–1.2)
BUN: 12 mg/dL (ref 7–25)
CALCIUM: 9.7 mg/dL (ref 8.6–10.4)
CO2: 28 mmol/L (ref 20–31)
CREATININE: 0.79 mg/dL (ref 0.50–0.99)
Chloride: 101 mmol/L (ref 98–110)
GFR, Est African American: >89 mL/min (ref >=60)
GFR, Est Non African American: 80 mL/min (ref >=60)
Glucose, Bld: 90 mg/dL (ref 65–99)
Potassium: 3.7 mmol/L (ref 3.5–5.3)
Sodium: 139 mmol/L (ref 135–146)
Total Protein: 7.9 g/dL (ref 6.1–8.1)

## 2016-07-02 LAB — POCT URINALYSIS DIPSTICK
Bilirubin, UA: NEGATIVE
Glucose, UA: NEGATIVE
Ketones, UA: NEGATIVE
Leukocytes, UA: NEGATIVE
Nitrite, UA: NEGATIVE
Protein, UA: NEGATIVE
SPEC GRAV UA: 1.015
UROBILINOGEN UA: 0.2
pH, UA: 7

## 2016-07-02 LAB — CBC
HCT: 39 % (ref 35.0–45.0)
HEMOGLOBIN: 12.9 g/dL (ref 11.7–15.5)
MCH: 26.9 pg — ABNORMAL LOW (ref 27.0–33.0)
MCHC: 33.1 g/dL (ref 32.0–36.0)
MCV: 81.4 fL (ref 80.0–100.0)
MPV: 9.8 fL (ref 7.5–12.5)
Platelets: 242 10*3/uL (ref 140–400)
RBC: 4.79 MIL/uL (ref 3.80–5.10)
RDW: 14.8 % (ref 11.0–15.0)
WBC: 6 10*3/uL (ref 3.8–10.8)

## 2016-07-02 MED ORDER — TRIAMCINOLONE ACETONIDE 0.1 % EX CREA: 1.0000 "application " | TOPICAL_CREAM | Freq: Two times a day (BID) | CUTANEOUS | 0 refills | Status: DC

## 2016-07-02 MED FILL — ATORVASTATIN 20 MG TABLET: 20 | 30 days supply | Qty: 30 | Fill #10

## 2016-07-02 MED FILL — TRIAMCINOLONE 0.1% CREAM: 0.1 | 30 days supply | Qty: 30 | Fill #0

## 2016-07-02 NOTE — Patient Instructions (Addendum)
Jacqueline Orozco was seen today for follow-up.  Diagnoses and all orders for this visit:  Left lower quadrant pain -     CT Abdomen Pelvis Wo Contrast; Future -     CBC -     COMPLETE METABOLIC PANEL WITH GFR  Ureteropelvic junction obstruction -     POCT urinalysis dipstick  Cough -     DG Chest 2 View; Future  Allergic contact dermatitis due to adhesives -     triamcinolone cream (KENALOG) 0.1 %; Apply 1 application topically 2 (two) times daily.  Please complete chest x-ray today  Keep follow up with urology You will be called with results of CT scan and labs  If you develop fever, severe abdominal pain go to ED  F/u in  4 weeks with me for UPJ obstruction   Dr. Adrian Blackwater

## 2016-07-02 NOTE — Addendum Note (Signed)
Addended by: Boykin Nearing on: 07/02/2016 10:10 AM   Modules accepted: Orders

## 2016-07-02 NOTE — Progress Notes (Signed)
Subjective:  Patient ID: Jacqueline Orozco, female    DOB: 30-Jul-1950  Age: 66 y.o. MRN: [id number]  [place] interpreter used  [place], [place]  CC: Follow-up   HPI Jacqueline Orozco presents for   1 Left hydronephrosis: she is s/p with urethral stent placement on 06/17/2016 for left hydronephrosis, flank pain due to left UPJ obstruction secondary to lower pole crossing vessel. She reports pain in her left lower abdomen and mid abdomen that started 3-4 days after surgery related to cough. Pain is 6/10. Her cough has reduced with Robitussin DM and Delsym. No fever. She has chills. She has burning with urination and pelvic pain with urination. She is taking tylenol, 2 tabs twice daily for pain. She has not taking tylenol today. She has post-op follow up with urology on 07/25/2016.   Urine cultures  03/05/2016 urine culture positive for 50-100 K E. Coli 04/08/16 that was positive for group B strep.   On 04/12/2016 she had CT abdomen and pelvis that was pertinent for: LEFT hydronephrosis without ureteral dilatation or identified urinary tract calcification consistent with LEFT UPJ obstruction.  Probable surgical clip at mid stomach with questionable gastric wall thickening versus artifact from underdistention, question related to history of gastric AVM.   Social History  Substance Use Topics  . Smoking status: Never Smoker  . Smokeless tobacco: Never Used  . Alcohol use Yes     Comment: occasional wine   Past Surgical History:  Procedure Laterality Date  . COLONOSCOPY WITH PROPOFOL N/A 01/03/2015   Procedure: COLONOSCOPY WITH PROPOFOL;  Surgeon: Jerene Bears, MD;  Location: [facility];  Service: Gastroenterology;  Laterality: N/A;  . CYSTOSCOPY W/ URETERAL STENT PLACEMENT Left 06/17/2016   Procedure: CYSTOSCOPY WITH RETROGRADE PYELOGRAM/URETERAL STENT PLACEMENT;  Surgeon: Raynelle Bring, MD;  Location: [facility];  Service: Urology;  Laterality: Left;  . ESOPHAGOGASTRODUODENOSCOPY (EGD) WITH  PROPOFOL N/A 01/03/2015   Procedure: ESOPHAGOGASTRODUODENOSCOPY (EGD) WITH PROPOFOL;  Surgeon: Jerene Bears, MD;  Location: [facility];  Service: Gastroenterology;  Laterality: N/A;  . ESOPHAGOGASTRODUODENOSCOPY ENDOSCOPY     several times  . HOT HEMOSTASIS N/A 01/03/2015   Procedure: HOT HEMOSTASIS (ARGON PLASMA COAGULATION/BICAP);  Surgeon: Jerene Bears, MD;  Location: [facility];  Service: Gastroenterology;  Laterality: N/A;  . NO PAST SURGERIES    . ROBOT ASSISTED PYELOPLASTY Left 06/17/2016   Procedure: XI ROBOTIC ASSISTED PYELOPLASTY;  Surgeon: Raynelle Bring, MD;  Location: [facility];  Service: Urology;  Laterality: Left;    Outpatient Medications Prior to Visit  Medication Sig Dispense Refill  . acetaminophen (TYLENOL 8 HOUR) 650 MG CR tablet Take 1 tablet (650 mg total) by mouth every 8 (eight) hours as needed for pain. 90 tablet 1  . amLODipine (NORVASC) 10 MG tablet Take 1 tablet (10 mg total) by mouth daily. 90 tablet 3  . atorvastatin (LIPITOR) 20 MG tablet Take 1 tablet (20 mg total) by mouth daily. (Patient taking differently: Take 20 mg by mouth at bedtime. ) 90 tablet 3  . dicyclomine (BENTYL) 20 MG tablet Take 1 tablet (20 mg total) by mouth 3 (three) times daily before meals. 90 tablet 11  . hydrochlorothiazide (HYDRODIURIL) 25 MG tablet Take 1 tablet (25 mg total) by mouth daily. 90 tablet 3  . omeprazole (PRILOSEC) 40 MG capsule Take 1 capsule (40 mg total) by mouth daily. 90 capsule 3  . oxyCODONE (ROXICODONE) 5 MG immediate release tablet Take 1-2 tablets every 4-6 hours as needed for pain. 30 tablet 0  .  phenazopyridine (PYRIDIUM) 100 MG tablet Take 1 tablet (100 mg total) by mouth 3 (three) times daily as needed for pain. 20 tablet 0  . polyethylene glycol powder (GLYCOLAX/MIRALAX) powder Take 17 g by mouth daily. 714 g 11  . tamsulosin (FLOMAX) 0.4 MG CAPS capsule Take 1 capsule (0.4 mg total) by mouth daily. 30 capsule 3   No facility-administered medications prior to  visit.     ROS Review of Systems  Constitutional: Negative for chills and fever.  Eyes: Negative for visual disturbance.  Respiratory: Negative for shortness of breath.   Cardiovascular: Negative for chest pain.  Gastrointestinal: Positive for abdominal pain. Negative for blood in stool.  Musculoskeletal: Positive for arthralgias and back pain.  Skin: Negative for rash.  Allergic/Immunologic: Negative for immunocompromised state.  Neurological: Negative for dizziness and headaches.  Hematological: Negative for adenopathy. Does not bruise/bleed easily.  Psychiatric/Behavioral: Negative for dysphoric mood and suicidal ideas.    Objective:  BP 116/76 (BP Location: Left Arm, Patient Position: Sitting, Cuff Size: Small)   Pulse 68   Temp 97.5 F (36.4 C) (Oral)   Ht 5\' 3"  (1.6 m)   Wt 154 lb 9.6 oz (70.1 kg)   SpO2 97%   BMI 27.39 kg/m   BP/Weight 07/02/2016 [id number]  Systolic BP 99991111 [id number] -  Diastolic BP 76 71 -  Wt. (Lbs) 154.6 - 153  BMI 27.39 - 27.1    Physical Exam  Constitutional: She is oriented to person, place, and time. She appears well-developed and well-nourished. No distress.  HENT:  Head: Normocephalic and atraumatic.  Nose: Mucosal edema present.  Cardiovascular: Normal rate, regular rhythm, normal heart sounds and intact distal pulses.   Pulmonary/Chest: Effort normal and breath sounds normal.  Abdominal: Soft. Bowel sounds are normal. She exhibits no distension and no mass. There is tenderness in the left lower quadrant. There is no rebound, no guarding and no CVA tenderness.    Musculoskeletal: She exhibits no edema.  Neurological: She is alert and oriented to person, place, and time.  Skin: Skin is warm and dry. No rash noted.  Psychiatric: She has a normal mood and affect.     Chemistry      Component Value Date/Time   NA 140 06/18/2016 0523   K 3.5 06/18/2016 0523   CL 109 06/18/2016 0523   CO2 24 06/18/2016 0523   BUN 6 06/18/2016 0523     CREATININE 0.63 06/18/2016 0523   CREATININE 0.85 04/19/2016 0954      Component Value Date/Time   CALCIUM 8.8 (L) 06/18/2016 0523   ALKPHOS 60 11/23/2015 1203   AST 20 11/23/2015 1203   ALT 15 11/23/2015 1203   BILITOT 0.3 11/23/2015 1203     Lab Results  Component Value Date   WBC 5.1 06/11/2016   HGB 12.5 06/18/2016   HCT 37.5 06/18/2016   MCV 79.7 06/11/2016   PLT 163 06/11/2016   UA: large blood, otherwise negative   Assessment & Plan:   Chianna was seen today for follow-up.  Diagnoses and all orders for this visit:  Left lower quadrant pain -     CT Abdomen Pelvis Wo Contrast; Future -     CBC -     COMPLETE METABOLIC PANEL WITH GFR  Ureteropelvic junction obstruction -     POCT urinalysis dipstick  Cough -     DG Chest 2 View; Future  Allergic contact dermatitis due to adhesives -     triamcinolone cream (KENALOG) 0.1 %;  Apply 1 application topically 2 (two) times daily.    No orders of the defined types were placed in this encounter.   Follow-up: Return in about 4 weeks (around 07/30/2016) for UPJ obstruction. s/p stent, abdominal pain .   Boykin Nearing MD

## 2016-07-02 NOTE — Assessment & Plan Note (Signed)
Cough following recent hospitalization CXR ordered r/o HCAP

## 2016-07-02 NOTE — Assessment & Plan Note (Signed)
Lower abdominal pain following cystoscopy with stent placement   CT abdomen r/o abscess  CBC, CMP Continue tylenol prn Reviewed s/s to prompt patient to go to ED

## 2016-07-02 NOTE — Assessment & Plan Note (Signed)
Topical steroid cream  

## 2016-07-05 ENCOUNTER — Encounter (HOSPITAL_COMMUNITY): Payer: Self-pay

## 2016-07-05 ENCOUNTER — Ambulatory Visit (HOSPITAL_COMMUNITY)
Admission: RE | Admit: 2016-07-05 | Discharge: 2016-07-05 | Disposition: A | Discharge status: Home / Self Care | Payer: Self-pay | Source: Ambulatory Visit | Attending: Family Medicine | Admitting: Family Medicine

## 2016-07-05 DIAGNOSIS — N135 Crossing vessel and stricture of ureter without hydronephrosis: Secondary | ICD-10-CM

## 2016-07-05 DIAGNOSIS — R1032 Left lower quadrant pain: Secondary | ICD-10-CM | POA: Insufficient documentation

## 2016-07-05 DIAGNOSIS — Z960 Presence of urogenital implants: Secondary | ICD-10-CM | POA: Insufficient documentation

## 2016-07-05 MED ORDER — IOPAMIDOL (ISOVUE-300) INJECTION 61%
30.0000 mL | Freq: Once | INTRAVENOUS | Status: AC | PRN
  Administered 2016-07-05: 30 mL via ORAL

## 2016-07-05 MED ORDER — IOPAMIDOL (ISOVUE-300) INJECTION 61%
INTRAVENOUS | Status: AC
  Filled 2016-07-05: qty 100

## 2016-07-05 MED ORDER — IOPAMIDOL (ISOVUE-300) INJECTION 61%
INTRAVENOUS | Status: AC
  Filled 2016-07-05: qty 30

## 2016-07-05 MED ORDER — IOPAMIDOL (ISOVUE-300) INJECTION 61%
100.0000 mL | Freq: Once | INTRAVENOUS | Status: AC | PRN
  Administered 2016-07-05: 100 mL via INTRAVENOUS

## 2016-07-05 NOTE — Addendum Note (Signed)
Addended by: Boykin Nearing on: 07/05/2016 02:29 PM   Modules accepted: Orders

## 2016-07-08 MED FILL — ?TAMSULOSIN HCL 0.4 MG CAP: 0.4 | 30 days supply | Qty: 30 | Fill #1

## 2016-07-08 MED FILL — POLYETHYLENE GLYCOL 3350 PO: 30 days supply | Qty: 510 | Fill #1

## 2016-07-19 ENCOUNTER — Other Ambulatory Visit: Payer: Self-pay | Admitting: Family Medicine

## 2016-07-19 DIAGNOSIS — I10 Essential (primary) hypertension: Secondary | ICD-10-CM

## 2016-07-19 DIAGNOSIS — K589 Irritable bowel syndrome without diarrhea: Secondary | ICD-10-CM

## 2016-07-19 MED FILL — OMEPRAZOLE DR 40 MG CAPSULE: 40 | 30 days supply | Qty: 30 | Fill #11

## 2016-07-19 MED FILL — AMLODIPINE BESYLATE 10 MG T: 10 | 30 days supply | Qty: 30 | Fill #0

## 2016-07-22 MED FILL — DICYCLOMINE 20 MG TABLET: 20 | 30 days supply | Qty: 90 | Fill #0

## 2016-07-22 MED FILL — HYDROCHLOROTHIAZIDE 25 MG T: 25 | 30 days supply | Qty: 30 | Fill #0

## 2016-07-30 ENCOUNTER — Ambulatory Visit: Payer: Self-pay | Attending: Family Medicine | Admitting: Family Medicine

## 2016-07-30 ENCOUNTER — Encounter: Payer: Self-pay | Admitting: Family Medicine

## 2016-07-30 ENCOUNTER — Ambulatory Visit: Payer: Self-pay

## 2016-07-30 VITALS — BP 127/80 | HR 66 | Temp 98.5°F | Ht 63.0 in | Wt 156.6 lb

## 2016-07-30 DIAGNOSIS — Q620 Congenital hydronephrosis: Secondary | ICD-10-CM

## 2016-07-30 DIAGNOSIS — Q6211 Congenital occlusion of ureteropelvic junction: Secondary | ICD-10-CM

## 2016-07-30 DIAGNOSIS — Z960 Presence of urogenital implants: Secondary | ICD-10-CM | POA: Insufficient documentation

## 2016-07-30 DIAGNOSIS — N131 Hydronephrosis with ureteral stricture, not elsewhere classified: Secondary | ICD-10-CM | POA: Insufficient documentation

## 2016-07-30 LAB — POCT URINALYSIS DIPSTICK
Bilirubin, UA: NEGATIVE
Glucose, UA: NEGATIVE
Ketones, UA: NEGATIVE
Leukocytes, UA: NEGATIVE
Nitrite, UA: NEGATIVE
PROTEIN UA: NEGATIVE
Spec Grav, UA: 1.01
UROBILINOGEN UA: 0.2
pH, UA: 7

## 2016-07-30 NOTE — Assessment & Plan Note (Addendum)
S/p ureteral stent placement She is recovering post op just slight residual flank pain and dysuria She will f/u with urology in 3 months UA today is negative. No blood

## 2016-07-30 NOTE — Progress Notes (Signed)
Pt is here to follow up on surgery she had on January 8.

## 2016-07-30 NOTE — Patient Instructions (Addendum)
Diagnoses and all orders for this visit:  Hydronephrosis with ureteropelvic junction (UPJ) obstruction -     POCT urinalysis dipstick  you are healing welling.   F/u in 6 weeks for wellness physical   Dr. Adrian Blackwater  negat

## 2016-07-30 NOTE — Progress Notes (Signed)
Subjective:  Patient ID: Jacqueline Orozco, female    DOB: October 05, 1950  Age: 66 y.o. MRN: [id number]  [place] interpreter used  [place], [place] CC: No chief complaint on file.   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 Today, she reports that she completed her urology f/u appt on 07/25/16. She return in May 2018. She is having just mild soreness around surgical scar. She has a little pain when she urinates. She has a little pain on her L flank that is significantly better than it was prior to stent placement.  Urine cultures  03/05/2016 urine culture positive for 50-100 K E. Coli 04/08/16 that was positive for group B strep.   On 04/12/2016 she had CT abdomen and pelvis that was pertinent for: LEFT hydronephrosis without ureteral dilatation or identified urinary tract calcification consistent with LEFT UPJ obstruction.  Probable surgical clip at mid stomach with questionable gastric wall thickening versus artifact from underdistention, question related to history of gastric AVM.  CT abdomen and pelvis 07/05/2016 IMPRESSION: No acute finding identified.  Left double-J ureteral stent, with minimal stranding adjacent to the left collecting system which has been completely decompressed since the prior CT 04/12/2016. The stranding is nonspecific, potentially postoperative or inflammatory. If there is concern for infection, correlation with urinalysis may be useful.   Social History  Substance Use Topics  . Smoking status: Never Smoker  . Smokeless tobacco: Never Used  . Alcohol use Yes     Comment: occasional wine   Past Surgical History:  Procedure Laterality Date  . COLONOSCOPY WITH PROPOFOL N/A 01/03/2015   Procedure: COLONOSCOPY WITH PROPOFOL;  Surgeon: Jerene Bears, MD;  Location: [facility];  Service:  Gastroenterology;  Laterality: N/A;  . CYSTOSCOPY W/ URETERAL STENT PLACEMENT Left 06/17/2016   Procedure: CYSTOSCOPY WITH RETROGRADE PYELOGRAM/URETERAL STENT PLACEMENT;  Surgeon: Raynelle Bring, MD;  Location: [facility];  Service: Urology;  Laterality: Left;  . ESOPHAGOGASTRODUODENOSCOPY (EGD) WITH PROPOFOL N/A 01/03/2015   Procedure: ESOPHAGOGASTRODUODENOSCOPY (EGD) WITH PROPOFOL;  Surgeon: Jerene Bears, MD;  Location: [facility];  Service: Gastroenterology;  Laterality: N/A;  . ESOPHAGOGASTRODUODENOSCOPY ENDOSCOPY     several times  . HOT HEMOSTASIS N/A 01/03/2015   Procedure: HOT HEMOSTASIS (ARGON PLASMA COAGULATION/BICAP);  Surgeon: Jerene Bears, MD;  Location: [facility];  Service: Gastroenterology;  Laterality: N/A;  . NO PAST SURGERIES    . ROBOT ASSISTED PYELOPLASTY Left 06/17/2016   Procedure: XI ROBOTIC ASSISTED PYELOPLASTY;  Surgeon: Raynelle Bring, MD;  Location: [facility];  Service: Urology;  Laterality: Left;    Outpatient Medications Prior to Visit  Medication Sig Dispense Refill  . acetaminophen (TYLENOL 8 HOUR) 650 MG CR tablet Take 1 tablet (650 mg total) by mouth every 8 (eight) hours as needed for pain. 90 tablet 1  . amLODipine (NORVASC) 10 MG tablet Take 1 tablet (10 mg total) by mouth daily. 90 tablet 3  . atorvastatin (LIPITOR) 20 MG tablet Take 1 tablet (20 mg total) by mouth daily. (Patient taking differently: Take 20 mg by mouth at bedtime. ) 90 tablet 3  . dicyclomine (BENTYL) 20 MG tablet TAKE 1 TABLET BY MOUTH 3 TIMES DAILY BEFORE MEALS. 90 tablet 11  . hydrochlorothiazide (HYDRODIURIL) 25 MG tablet TAKE 1 TABLET BY MOUTH DAILY 90 tablet 3  . omeprazole (PRILOSEC) 40 MG capsule Take 1 capsule (  40 mg total) by mouth daily. 90 capsule 3  . oxyCODONE (ROXICODONE) 5 MG immediate release tablet Take 1-2 tablets every 4-6 hours as needed for pain. 30 tablet 0  . phenazopyridine (PYRIDIUM) 100 MG tablet Take 1 tablet (100 mg total) by mouth 3 (three) times daily as needed for  pain. 20 tablet 0  . polyethylene glycol powder (GLYCOLAX/MIRALAX) powder Take 17 g by mouth daily. 714 g 11  . tamsulosin (FLOMAX) 0.4 MG CAPS capsule Take 1 capsule (0.4 mg total) by mouth daily. 30 capsule 3  . triamcinolone cream (KENALOG) 0.1 % Apply 1 application topically 2 (two) times daily. 30 g 0   No facility-administered medications prior to visit.     ROS Review of Systems  Constitutional: Negative for chills and fever.  Eyes: Negative for visual disturbance.  Respiratory: Negative for shortness of breath.   Cardiovascular: Negative for chest pain.  Gastrointestinal: Positive for abdominal pain. Negative for blood in stool.  Musculoskeletal: Positive for arthralgias and back pain.  Skin: Negative for rash.  Allergic/Immunologic: Negative for immunocompromised state.  Neurological: Negative for dizziness and headaches.  Hematological: Negative for adenopathy. Does not bruise/bleed easily.  Psychiatric/Behavioral: Negative for dysphoric mood and suicidal ideas.    Objective:  BP 127/80 (BP Location: Left Arm, Patient Position: Sitting, Cuff Size: Small)   Pulse 66   Temp 98.5 F (36.9 C) (Oral)   Ht 5\' 3"  (1.6 m)   Wt 156 lb 9.6 oz (71 kg)   SpO2 98%   BMI 27.74 kg/m   BP/Weight 07/02/2016 [id number]  Systolic BP 99991111 123456 -  Diastolic BP 76 71 -  Wt. (Lbs) 154.6 - 153  BMI 27.39 - 27.1    Physical Exam  Constitutional: She is oriented to person, place, and time. She appears well-developed and well-nourished. No distress.  HENT:  Head: Normocephalic and atraumatic.  Nose: Mucosal edema present.  Cardiovascular: Normal rate, regular rhythm, normal heart sounds and intact distal pulses.   Pulmonary/Chest: Effort normal and breath sounds normal.  Abdominal: Soft. Bowel sounds are normal. She exhibits no distension and no mass. There is no rebound, no guarding and no CVA tenderness.    Musculoskeletal: She exhibits no edema.  Neurological: She is alert  and oriented to person, place, and time.  Skin: Skin is warm and dry. No rash noted.  Psychiatric: She has a normal mood and affect.     Chemistry      Component Value Date/Time   NA 139 07/02/2016 0942   K 3.7 07/02/2016 0942   CL 101 07/02/2016 0942   CO2 28 07/02/2016 0942   BUN 12 07/02/2016 0942   CREATININE 0.79 07/02/2016 0942      Component Value Date/Time   CALCIUM 9.7 07/02/2016 0942   ALKPHOS 66 07/02/2016 0942   AST 26 07/02/2016 0942   ALT 31 (H) 07/02/2016 0942   BILITOT 0.5 07/02/2016 0942     Lab Results  Component Value Date   WBC 6.0 07/02/2016   HGB 12.9 07/02/2016   HCT 39.0 07/02/2016   MCV 81.4 07/02/2016   PLT 242 07/02/2016   UA: negative, no blood.   Assessment & Plan:   Diagnoses and all orders for this visit:  Hydronephrosis with ureteropelvic junction (UPJ) obstruction -     POCT urinalysis dipstick    No orders of the defined types were placed in this encounter.   Follow-up: Return in about 6 weeks (around 09/10/2016) for wellness physical .  Boykin Nearing MD

## 2016-07-31 ENCOUNTER — Other Ambulatory Visit: Payer: Self-pay | Admitting: Family Medicine

## 2016-07-31 DIAGNOSIS — E785 Hyperlipidemia, unspecified: Secondary | ICD-10-CM

## 2016-08-01 MED FILL — ATORVASTATIN 20 MG TABLET: 20 | 30 days supply | Qty: 30 | Fill #0

## 2016-08-16 ENCOUNTER — Other Ambulatory Visit: Payer: Self-pay | Admitting: Family Medicine

## 2016-08-16 DIAGNOSIS — K589 Irritable bowel syndrome without diarrhea: Secondary | ICD-10-CM

## 2016-08-16 DIAGNOSIS — I10 Essential (primary) hypertension: Secondary | ICD-10-CM

## 2016-08-16 MED FILL — POLYETHYLENE GLYCOL 3350 PO: 30 days supply | Qty: 510 | Fill #2

## 2016-08-16 MED FILL — OMEPRAZOLE DR 40 MG CAPSULE: 40 | 30 days supply | Qty: 30 | Fill #0

## 2016-08-16 MED FILL — AMLODIPINE BESYLATE 10 MG T: 10 | 30 days supply | Qty: 30 | Fill #0

## 2016-08-16 MED FILL — DICYCLOMINE 20 MG TABLET: 20 | 30 days supply | Qty: 90 | Fill #1

## 2016-08-16 MED FILL — HYDROCHLOROTHIAZIDE 25 MG T: 25 | 30 days supply | Qty: 30 | Fill #1

## 2016-09-02 MED FILL — ATORVASTATIN 20 MG TABLET: 20 | 30 days supply | Qty: 30 | Fill #1

## 2016-09-10 ENCOUNTER — Ambulatory Visit: Payer: Self-pay | Attending: Family Medicine | Admitting: Family Medicine

## 2016-09-10 ENCOUNTER — Encounter: Payer: Self-pay | Admitting: Family Medicine

## 2016-09-10 VITALS — BP 125/82 | HR 66 | Temp 98.0°F | Ht 63.0 in | Wt 154.4 lb

## 2016-09-10 DIAGNOSIS — Z91040 Latex allergy status: Secondary | ICD-10-CM | POA: Insufficient documentation

## 2016-09-10 DIAGNOSIS — M519 Unspecified thoracic, thoracolumbar and lumbosacral intervertebral disc disorder: Secondary | ICD-10-CM

## 2016-09-10 DIAGNOSIS — K0889 Other specified disorders of teeth and supporting structures: Secondary | ICD-10-CM

## 2016-09-10 DIAGNOSIS — Z0000 Encounter for general adult medical examination without abnormal findings: Secondary | ICD-10-CM | POA: Insufficient documentation

## 2016-09-10 DIAGNOSIS — Z880 Allergy status to penicillin: Secondary | ICD-10-CM | POA: Insufficient documentation

## 2016-09-10 DIAGNOSIS — M5186 Other intervertebral disc disorders, lumbar region: Secondary | ICD-10-CM | POA: Insufficient documentation

## 2016-09-10 DIAGNOSIS — R102 Pelvic and perineal pain: Secondary | ICD-10-CM | POA: Insufficient documentation

## 2016-09-10 LAB — POCT URINALYSIS DIPSTICK
Bilirubin, UA: NEGATIVE
Blood, UA: NEGATIVE
Glucose, UA: NEGATIVE
Ketones, UA: NEGATIVE
LEUKOCYTES UA: NEGATIVE
Nitrite, UA: NEGATIVE
PROTEIN UA: NEGATIVE
Spec Grav, UA: 1.01 (ref 1.030–1.035)
UROBILINOGEN UA: 0.2 (ref ?–2.0)
pH, UA: 7 (ref 5.0–8.0)

## 2016-09-10 LAB — HEMOCCULT GUIAC POC 1CARD (OFFICE): FECAL OCCULT BLD: NEGATIVE

## 2016-09-10 MED ORDER — METHYLPREDNISOLONE 4 MG PO TBPK
ORAL_TABLET | ORAL | 0 refills | Status: DC
Start: 2016-09-10 — End: 2016-10-22

## 2016-09-10 MED ORDER — METHYLPREDNISOLONE 4 MG PO TBPK: ORAL_TABLET | ORAL | 0 refills | Status: DC

## 2016-09-10 MED FILL — ?METHYLPREDNISOLONE 4 MG TA: 4 | 6 days supply | Qty: 21 | Fill #0

## 2016-09-10 NOTE — Progress Notes (Signed)
SUBJECTIVE:  66 y.o. female for annual routine checkup. Her next pap smear is due 09/2017. She had a normal screening mammogram on 06/2015. She complains of burning sensation on R sided of abdomen. Some pain at times at her incision site for cystoscopy w/ ureteral stent placement. She also has bilateral lower pelvic pain. Pain is severe at times and prevents her from walking or standing. She also gets low back pain that occurs after 30 minutes of prolonged standing. Pain is associated with shaking and sensation that she will fall. Pain for past 4-5 years.  Pain and swelling in both lateral ankles and feet. She had a twisting injury to her L ankle about 3 years ago.   Past Surgical History:  Procedure Laterality Date  . COLONOSCOPY WITH PROPOFOL N/A 01/03/2015   Procedure: COLONOSCOPY WITH PROPOFOL;  Surgeon: Jerene Bears, MD;  Location: [facility];  Service: Gastroenterology;  Laterality: N/A;  . CYSTOSCOPY W/ URETERAL STENT PLACEMENT Left 06/17/2016   Procedure: CYSTOSCOPY WITH RETROGRADE PYELOGRAM/URETERAL STENT PLACEMENT;  Surgeon: Raynelle Bring, MD;  Location: [facility];  Service: Urology;  Laterality: Left;  . ESOPHAGOGASTRODUODENOSCOPY (EGD) WITH PROPOFOL N/A 01/03/2015   Procedure: ESOPHAGOGASTRODUODENOSCOPY (EGD) WITH PROPOFOL;  Surgeon: Jerene Bears, MD;  Location: [facility];  Service: Gastroenterology;  Laterality: N/A;  . ESOPHAGOGASTRODUODENOSCOPY ENDOSCOPY     several times  . HOT HEMOSTASIS N/A 01/03/2015   Procedure: HOT HEMOSTASIS (ARGON PLASMA COAGULATION/BICAP);  Surgeon: Jerene Bears, MD;  Location: [facility];  Service: Gastroenterology;  Laterality: N/A;  . NO PAST SURGERIES    . ROBOT ASSISTED PYELOPLASTY Left 06/17/2016   Procedure: XI ROBOTIC ASSISTED PYELOPLASTY;  Surgeon: Raynelle Bring, MD;  Location: [facility];  Service: Urology;  Laterality: Left;    Social History  Substance Use Topics  . Smoking status: Never Smoker  . Smokeless tobacco: Never Used  . Alcohol use Yes    Comment: occasional wine   Current Outpatient Prescriptions  Medication Sig Dispense Refill  . acetaminophen (TYLENOL 8 HOUR) 650 MG CR tablet Take 1 tablet (650 mg total) by mouth every 8 (eight) hours as needed for pain. 90 tablet 1  . atorvastatin (LIPITOR) 20 MG tablet TAKE 1 TABLET BY MOUTH DAILY 90 tablet 0  . dicyclomine (BENTYL) 20 MG tablet TAKE 1 TABLET BY MOUTH 3 TIMES DAILY BEFORE MEALS. 90 tablet 11  . hydrochlorothiazide (HYDRODIURIL) 25 MG tablet TAKE 1 TABLET BY MOUTH DAILY 90 tablet 3  . omeprazole (PRILOSEC) 40 MG capsule TAKE ONE CAPSULE BY MOUTH DAILY 90 capsule 3  . polyethylene glycol powder (GLYCOLAX/MIRALAX) powder Take 17 g by mouth daily. 714 g 11  . tamsulosin (FLOMAX) 0.4 MG CAPS capsule Take 1 capsule (0.4 mg total) by mouth daily. 30 capsule 3  . triamcinolone cream (KENALOG) 0.1 % Apply 1 application topically 2 (two) times daily. 30 g 0  . amLODipine (NORVASC) 10 MG tablet TAKE 1 TABLET BY MOUTH DAILY 90 tablet 3  . oxyCODONE (ROXICODONE) 5 MG immediate release tablet Take 1-2 tablets every 4-6 hours as needed for pain. (Patient not taking: Reported on 09/10/2016) 30 tablet 0  . phenazopyridine (PYRIDIUM) 100 MG tablet Take 1 tablet (100 mg total) by mouth 3 (three) times daily as needed for pain. (Patient not taking: Reported on 09/10/2016) 20 tablet 0   No current facility-administered medications for this visit.    Allergies: Aspirin; Penicillins; Latex; Streptomycin; and Tramadol  No LMP recorded. Patient is postmenopausal.  ROS:  Feeling well. No dyspnea  or chest pain on exertion.  No abdominal pain, change in bowel habits, black or bloody stools.  No urinary tract symptoms. GYN ROS: no breast pain or new or enlarging lumps on self exam, no vaginal bleeding. No neurological complaints.  OBJECTIVE:  The patient appears well, alert, oriented x 3, in no distress. BP 125/82   Pulse 66   Temp 98 F (36.7 C) (Oral)   Ht 5\' 3"  (1.6 m)   Wt 154 lb 6.4 oz (70  kg)   SpO2 97%   BMI 27.35 kg/m  ENT normal. Worn down molars. Swollen nasal turbinates. Neck supple. No adenopathy or thyromegaly. PERLA. Lungs are clear, good air entry, no wheezes, rhonchi or rales. S1 and S2 normal, no murmurs, regular rate and rhythm. Abdomen soft without tenderness, guarding, mass or organomegaly. Extremities show slight swelling in lateral ankles, normal peripheral pulses. Neurological is normal, no focal findings.  Back Exam: Back: Normal Curvature, no deformities or CVA tenderness  Paraspinal Tenderness: b/l lumbar   LE Strength 5/5  LE Sensation: in tact  LE Reflexes 2+ and symmetric  Straight leg raise: + on L    BREAST EXAM: breasts appear normal, no suspicious masses, no skin or nipple changes or axillary nodes  PELVIC EXAM: normal external genitalia, vulva, vagina, cervix, uterus and adnexa, RECTAL: normal rectal, no masses, guaiac negative stool obtained  ASSESSMENT:  well woman  Jacqueline Orozco was seen today for annual exam.  Diagnoses and all orders for this visit:  Physical exam -     POCT urinalysis dipstick  Lumbar disc disease -     DG Lumbar Spine 2-3 Views; Future -     Discontinue: methylPREDNISolone (MEDROL DOSEPAK) 4 MG TBPK tablet; Taper per packet insert -     methylPREDNISolone (MEDROL DOSEPAK) 4 MG TBPK tablet; Taper per packet insert  Healthcare maintenance -     Ambulatory referral to Optometry -     POCT occult blood stool  Pain of molar -     Ambulatory referral to Dentistry

## 2016-09-10 NOTE — Patient Instructions (Addendum)
Jacqueline Orozco was seen today for annual exam.  Diagnoses and all orders for this visit:  Physical exam -     POCT urinalysis dipstick  Lumbar disc disease -     DG Lumbar Spine 2-3 Views; Future -     methylPREDNISolone (MEDROL DOSEPAK) 4 MG TBPK tablet; Taper per packet insert  Healthcare maintenance -     Ambulatory referral to Optometry -     POCT occult blood stool  Pain of molar -     Ambulatory referral to Dentistry   f/u in 6 weeks for low back pain   Dr. Adrian Blackwater   Back Exercises If you have pain in your back, do these exercises 2-3 times each day or as told by your doctor. When the pain goes away, do the exercises once each day, but repeat the steps more times for each exercise (do more repetitions). If you do not have pain in your back, do these exercises once each day or as told by your doctor. Exercises Single Knee to Chest   Do these steps 3-5 times in a row for each leg: 1. Lie on your back on a firm bed or the floor with your legs stretched out. 2. Bring one knee to your chest. 3. Hold your knee to your chest by grabbing your knee or thigh. 4. Pull on your knee until you feel a gentle stretch in your lower back. 5. Keep doing the stretch for 10-30 seconds. 6. Slowly let go of your leg and straighten it. Pelvic Tilt   Do these steps 5-10 times in a row: 1. Lie on your back on a firm bed or the floor with your legs stretched out. 2. Bend your knees so they point up to the ceiling. Your feet should be flat on the floor. 3. Tighten your lower belly (abdomen) muscles to press your lower back against the floor. This will make your tailbone point up to the ceiling instead of pointing down to your feet or the floor. 4. Stay in this position for 5-10 seconds while you gently tighten your muscles and breathe evenly. Cat-Cow   Do these steps until your lower back bends more easily: 1. Get on your hands and knees on a firm surface. Keep your hands under your shoulders, and keep  your knees under your hips. You may put padding under your knees. 2. Let your head hang down, and make your tailbone point down to the floor so your lower back is round like the back of a cat. 3. Stay in this position for 5 seconds. 4. Slowly lift your head and make your tailbone point up to the ceiling so your back hangs low (sags) like the back of a cow. 5. Stay in this position for 5 seconds. Press-Ups   Do these steps 5-10 times in a row: 1. Lie on your belly (face-down) on the floor. 2. Place your hands near your head, about shoulder-width apart. 3. While you keep your back relaxed and keep your hips on the floor, slowly straighten your arms to raise the top half of your body and lift your shoulders. Do not use your back muscles. To make yourself more comfortable, you may change where you place your hands. 4. Stay in this position for 5 seconds. 5. Slowly return to lying flat on the floor. Bridges   Do these steps 10 times in a row: 1. Lie on your back on a firm surface. 2. Bend your knees so they point up to  the ceiling. Your feet should be flat on the floor. 3. Tighten your butt muscles and lift your butt off of the floor until your waist is almost as high as your knees. If you do not feel the muscles working in your butt and the back of your thighs, slide your feet 1-2 inches farther away from your butt. 4. Stay in this position for 3-5 seconds. 5. Slowly lower your butt to the floor, and let your butt muscles relax. If this exercise is too easy, try doing it with your arms crossed over your chest. Belly Crunches   Do these steps 5-10 times in a row: 1. Lie on your back on a firm bed or the floor with your legs stretched out. 2. Bend your knees so they point up to the ceiling. Your feet should be flat on the floor. 3. Cross your arms over your chest. 4. Tip your chin a little bit toward your chest but do not bend your neck. 5. Tighten your belly muscles and slowly raise your  chest just enough to lift your shoulder blades a tiny bit off of the floor. 6. Slowly lower your chest and your head to the floor. Back Lifts  Do these steps 5-10 times in a row: 1. Lie on your belly (face-down) with your arms at your sides, and rest your forehead on the floor. 2. Tighten the muscles in your legs and your butt. 3. Slowly lift your chest off of the floor while you keep your hips on the floor. Keep the back of your head in line with the curve in your back. Look at the floor while you do this. 4. Stay in this position for 3-5 seconds. 5. Slowly lower your chest and your face to the floor. Contact a doctor if:  Your back pain gets a lot worse when you do an exercise.  Your back pain does not lessen 2 hours after you exercise. If you have any of these problems, stop doing the exercises. Do not do them again unless your doctor says it is okay. Get help right away if:  You have sudden, very bad back pain. If this happens, stop doing the exercises. Do not do them again unless your doctor says it is okay. This information is not intended to replace advice given to you by your health care provider. Make sure you discuss any questions you have with your health care provider. Document Released: 06/29/2010 Document Revised: 11/02/2015 Document Reviewed: 07/21/2014 Elsevier Interactive Patient Education  2017 [employer].

## 2016-09-10 NOTE — Assessment & Plan Note (Signed)
Chronic back pain with radicular symptoms on the L side  Plan: Pain film Steroid course

## 2016-09-17 MED FILL — AMLODIPINE BESYLATE 10 MG T: 10 | 30 days supply | Qty: 30 | Fill #1

## 2016-09-17 MED FILL — OMEPRAZOLE DR 40 MG CAPSULE: 40 | 30 days supply | Qty: 30 | Fill #1

## 2016-09-17 MED FILL — POLYETHYLENE GLYCOL 3350 PO: 30 days supply | Qty: 510 | Fill #3

## 2016-09-17 MED FILL — DICYCLOMINE 20 MG TABLET: 20 | 30 days supply | Qty: 90 | Fill #2

## 2016-09-17 MED FILL — HYDROCHLOROTHIAZIDE 25 MG T: 25 | 30 days supply | Qty: 30 | Fill #2

## 2016-10-01 MED FILL — ?ATORVASTATIN 20 MG TABLET: 20 | 30 days supply | Qty: 30 | Fill #2

## 2016-10-16 ENCOUNTER — Other Ambulatory Visit: Payer: Self-pay | Admitting: Family Medicine

## 2016-10-16 DIAGNOSIS — E785 Hyperlipidemia, unspecified: Secondary | ICD-10-CM

## 2016-10-16 MED FILL — DICYCLOMINE 20 MG TABLET: 20 | 30 days supply | Qty: 90 | Fill #3

## 2016-10-16 MED FILL — AMLODIPINE BESYLATE 10 MG T: 10 | 30 days supply | Qty: 30 | Fill #2

## 2016-10-16 MED FILL — OMEPRAZOLE DR 40 MG CAPSULE: 40 | 30 days supply | Qty: 30 | Fill #2

## 2016-10-16 MED FILL — HYDROCHLOROTHIAZIDE 25 MG T: 25 | 30 days supply | Qty: 30 | Fill #3

## 2016-10-21 ENCOUNTER — Other Ambulatory Visit: Payer: Self-pay

## 2016-10-21 DIAGNOSIS — K581 Irritable bowel syndrome with constipation: Secondary | ICD-10-CM

## 2016-10-21 MED ORDER — POLYETHYLENE GLYCOL 3350 17 GM/SCOOP PO POWD: 17.0000 g | Freq: Every day | ORAL | 11 refills | Status: DC

## 2016-10-21 MED FILL — POLYETHYLENE GLYCOL 3350: 15 days supply | Qty: 510 | Fill #0

## 2016-10-22 ENCOUNTER — Encounter: Payer: Self-pay | Admitting: Family Medicine

## 2016-10-22 ENCOUNTER — Ambulatory Visit: Payer: Self-pay | Attending: Family Medicine | Admitting: Family Medicine

## 2016-10-22 VITALS — BP 124/80 | HR 67 | Temp 98.6°F | Ht 63.0 in | Wt 151.4 lb

## 2016-10-22 DIAGNOSIS — E2839 Other primary ovarian failure: Secondary | ICD-10-CM | POA: Insufficient documentation

## 2016-10-22 DIAGNOSIS — J301 Allergic rhinitis due to pollen: Secondary | ICD-10-CM | POA: Insufficient documentation

## 2016-10-22 DIAGNOSIS — M4646 Discitis, unspecified, lumbar region: Secondary | ICD-10-CM | POA: Insufficient documentation

## 2016-10-22 DIAGNOSIS — I10 Essential (primary) hypertension: Secondary | ICD-10-CM | POA: Insufficient documentation

## 2016-10-22 DIAGNOSIS — Z79899 Other long term (current) drug therapy: Secondary | ICD-10-CM | POA: Insufficient documentation

## 2016-10-22 DIAGNOSIS — M519 Unspecified thoracic, thoracolumbar and lumbosacral intervertebral disc disorder: Secondary | ICD-10-CM

## 2016-10-22 MED ORDER — CALCIUM CITRATE 200 MG PO TABS: 400.0000 mg | ORAL_TABLET | Freq: Every day | ORAL | 0 refills | Status: DC

## 2016-10-22 MED ORDER — CETIRIZINE HCL 10 MG PO TABS: 10.0000 mg | ORAL_TABLET | Freq: Every day | ORAL | 11 refills | Status: DC

## 2016-10-22 MED ORDER — CALCIUM CITRATE-VITAMIN D 500-400 MG-UNIT PO CHEW
1.0000 | CHEWABLE_TABLET | Freq: Two times a day (BID) | ORAL | 1 refills | Status: DC
Start: 2016-10-22 — End: 2021-05-17

## 2016-10-22 MED FILL — ?CETIRIZINE HCL 10 MG TABLE: 10 | 30 days supply | Qty: 30 | Fill #0

## 2016-10-22 NOTE — Assessment & Plan Note (Addendum)
Chronic back pain with radicular symptoms on the L side, improved following steroid course. She is uninsured. She is high risk for osteoporosis.  Plan: Pain film PT

## 2016-10-22 NOTE — Assessment & Plan Note (Signed)
seasonal allergies Daily zyrtec

## 2016-10-22 NOTE — Patient Instructions (Addendum)
Jacqueline Orozco was seen today for back pain.  Diagnoses and all orders for this visit:  Lumbar disc disease -     Cancel: DG Bone Density; Future -     Ambulatory referral to Physical Therapy  Estrogen deficiency -     Cancel: DG Bone Density; Future -     Calcium Citrate 200 MG TABS; Take 2 tablets (400 mg total) by mouth daily. -     calcium citrate-vitamin D 500-400 MG-UNIT chewable tablet; Chew 1 tablet by mouth 2 (two) times daily.  Seasonal allergic rhinitis due to pollen -     cetirizine (ZYRTEC) 10 MG tablet; Take 1 tablet (10 mg total) by mouth daily.   Please complete ordered x-ray Due to cost we will wait on bone density scan I do recommend calcium and vitamin D supplements to promote bone health  Reduce heavy lifting to no more than 25 lbs at a time Physical therapy is also helpful for low back pain and has been ordered   It is allergy season continue daily zyrtec Rinse away pollen with nasal saline, sterile boiled water sinus flushes and gargling with warm water and salt.    F/u in 8 weeks for low back pain  Dr. Adrian Blackwater

## 2016-10-22 NOTE — Addendum Note (Signed)
Addended by: [name] on: 10/22/2016 12:17 PM   Modules accepted: Orders

## 2016-10-22 NOTE — Progress Notes (Signed)
Jacqueline Orozco is a 66 yo [place] female with HTN, L sided hydronephrosis due to ureteropelvic junction obstruction who  presents for    1. f/u chronic low back pain:  Pain started in 2013. No injury.  She also gets low back pain that occurs after 30 minutes of prolonged standing. Pain is associated with shaking and sensation, pain is exacerbated by heavy lifting over 30 lbs.   She completed a course of depo medrol prescribed at the last visit. She did not complete ordered low back x-ray.  Previous lumbar spine imaging: 1. CT abdomen and pelvis 07/05/2016:  Musculoskeletal: No displaced fracture. Mild degenerative changes. Grade 1 anterolisthesis of L5 on S1. No significant degenerative changes of the hips.  2. DG lumbar spine 09/30/2013: There is no fracture. There is a grade 1 anterolisthesis of L5 on S1. Moderate loss of disk height is noted at L4-L5 and L5-S1. There is facet degenerative change bilaterally at L4-L5 and L5-S1, greatest at L5-S1   2. Sore throat:  Started 4 days ago. Associated with dry cough and runny nose. She took two allergy tablets and and gargled with salt water. She feels much better today. She denies fever and chills.          Social History   Substance Use Topics   . Smoking status: Never Smoker   . Smokeless tobacco: Never Used   . Alcohol use Yes     Comment: occasional wine           Outpatient Medications Prior to Visit  Medication Sig Dispense Refill  . acetaminophen (TYLENOL 8 HOUR) 650 MG CR tablet Take 1 tablet (650 mg total) by mouth every 8 (eight) hours as needed for pain. 90 tablet 1  . amLODipine (NORVASC) 10 MG tablet TAKE 1 TABLET BY MOUTH DAILY 90 tablet 3  . atorvastatin (LIPITOR) 20 MG tablet TAKE 1 TABLET BY MOUTH DAILY 90 tablet 0  . dicyclomine (BENTYL) 20 MG tablet TAKE 1 TABLET BY MOUTH 3 TIMES DAILY BEFORE MEALS. 90 tablet 11  . hydrochlorothiazide (HYDRODIURIL) 25 MG tablet TAKE 1 TABLET BY MOUTH DAILY 90 tablet 3   . omeprazole (PRILOSEC) 40 MG capsule TAKE ONE CAPSULE BY MOUTH DAILY 90 capsule 3  . polyethylene glycol powder (GLYCOLAX/MIRALAX) powder Take 17 g by mouth daily. 510 g 11  . tamsulosin (FLOMAX) 0.4 MG CAPS capsule Take 1 capsule (0.4 mg total) by mouth daily. 30 capsule 3  . triamcinolone cream (KENALOG) 0.1 % Apply 1 application topically 2 (two) times daily. 30 g 0  . methylPREDNISolone (MEDROL DOSEPAK) 4 MG TBPK tablet Taper per packet insert (Patient not taking: Reported on 10/22/2016) 21 tablet 0   No facility-administered medications prior to visit.     ROS Review of Systems  Constitutional: Negative for chills and fever.  HENT: Positive for rhinorrhea and sore throat.   Eyes: Negative for visual disturbance.  Respiratory: Positive for cough. Negative for shortness of breath.   Cardiovascular: Negative for chest pain.  Gastrointestinal: Negative for abdominal pain and blood in stool.  Musculoskeletal: Positive for back pain. Negative for arthralgias.  Skin: Negative for rash.  Allergic/Immunologic: Negative for immunocompromised state.  Hematological: Negative for adenopathy. Does not bruise/bleed easily.  Psychiatric/Behavioral: Negative for dysphoric mood and suicidal ideas.    Objective:  BP 124/80   Pulse 67   Temp 98.6 F (37 C) (Oral)   Ht 5\' 3"  (1.6 m)   Wt 151 lb 6.4 oz (68.7 kg)   SpO2  99%   BMI 26.82 kg/m   BP/Weight 10/22/2016 09/10/2016 2/41/1464  Systolic BP 314 276 701  Diastolic BP 80 82 80  Wt. (Lbs) 151.4 154.4 156.6  BMI 26.82 27.35 27.74    Physical Exam  Constitutional: She is oriented to person, place, and time. She appears well-developed and well-nourished. No distress.  HENT:  Head: Normocephalic and atraumatic.  Right Ear: Tympanic membrane, external ear and ear canal normal.  Left Ear: Tympanic membrane, external ear and ear canal normal.  Nose: Mucosal edema present.  Mouth/Throat: Oropharynx is clear and moist and mucous  membranes are normal.  Cardiovascular: Normal rate, regular rhythm, normal heart sounds and intact distal pulses.   Pulmonary/Chest: Effort normal and breath sounds normal.  Musculoskeletal: She exhibits no edema.  Neurological: She is alert and oriented to person, place, and time.  Skin: Skin is warm and dry. No rash noted.  Psychiatric: She has a normal mood and affect.     Assessment & Plan:  Jacqueline Orozco was seen today for back pain.  Diagnoses and all orders for this visit:  Lumbar disc disease -     DG Bone Density; Future -     Ambulatory referral to Physical Therapy  Estrogen deficiency -     DG Bone Density; Future  Seasonal allergic rhinitis due to pollen -     cetirizine (ZYRTEC) 10 MG tablet; Take 1 tablet (10 mg total) by mouth daily.   There are no diagnoses linked to this encounter.  No orders of the defined types were placed in this encounter.   Follow-up: No Follow-up on file.   Boykin Nearing MD

## 2016-10-23 ENCOUNTER — Encounter: Payer: Self-pay | Admitting: Family Medicine

## 2016-10-25 ENCOUNTER — Ambulatory Visit: Payer: Self-pay | Attending: Family Medicine

## 2016-10-25 DIAGNOSIS — M4310 Spondylolisthesis, site unspecified: Secondary | ICD-10-CM

## 2016-10-31 ENCOUNTER — Other Ambulatory Visit: Payer: Self-pay | Admitting: Family Medicine

## 2016-10-31 MED ORDER — CALCIUM CITRATE 250 MG PO TABS: 500.0000 mg | ORAL_TABLET | Freq: Every day | ORAL | 11 refills | Status: DC

## 2016-11-06 ENCOUNTER — Ambulatory Visit: Payer: Self-pay | Attending: Family Medicine | Admitting: Physical Therapy

## 2016-11-15 MED FILL — AMLODIPINE BESYLATE 10 MG T: 10 | 30 days supply | Qty: 30 | Fill #3

## 2016-11-15 MED FILL — HYDROCHLOROTHIAZIDE 25 MG T: 25 | 30 days supply | Qty: 30 | Fill #4

## 2016-11-15 MED FILL — OMEPRAZOLE DR 40 MG CAPSULE: 40 | 30 days supply | Qty: 30 | Fill #3

## 2016-11-15 MED FILL — DICYCLOMINE 20 MG TABLET: 20 | 30 days supply | Qty: 90 | Fill #4

## 2016-11-15 MED FILL — ATORVASTATIN 20 MG TABLET: 20 | 30 days supply | Qty: 30 | Fill #0

## 2016-12-13 MED FILL — ?CETIRIZINE HCL 10 MG TABLE: 10 | 30 days supply | Qty: 30 | Fill #1

## 2016-12-13 MED FILL — OMEPRAZOLE DR 40 MG CAPSULE: 40 | 30 days supply | Qty: 30 | Fill #4

## 2016-12-13 MED FILL — HYDROCHLOROTHIAZIDE 25 MG T: 25 | 30 days supply | Qty: 30 | Fill #5

## 2016-12-13 MED FILL — DICYCLOMINE 20 MG TABLET: 20 | 30 days supply | Qty: 90 | Fill #5

## 2016-12-13 MED FILL — POLYETHYLENE GLYCOL 3350 PO: 30 days supply | Qty: 510 | Fill #1

## 2016-12-13 MED FILL — ATORVASTATIN 20 MG TABLET: 20 | 30 days supply | Qty: 30 | Fill #1

## 2016-12-13 MED FILL — ?AMLODIPINE BESYLATE 10 MG: 10 | 30 days supply | Qty: 30 | Fill #4

## 2016-12-23 ENCOUNTER — Ambulatory Visit: Payer: Self-pay | Attending: Internal Medicine | Admitting: Internal Medicine

## 2016-12-23 ENCOUNTER — Encounter: Payer: Self-pay | Admitting: Internal Medicine

## 2016-12-23 VITALS — BP 119/75 | HR 61 | Temp 98.3°F | Resp 16 | Wt 147.8 lb

## 2016-12-23 DIAGNOSIS — I10 Essential (primary) hypertension: Secondary | ICD-10-CM | POA: Insufficient documentation

## 2016-12-23 DIAGNOSIS — M19031 Primary osteoarthritis, right wrist: Secondary | ICD-10-CM | POA: Insufficient documentation

## 2016-12-23 DIAGNOSIS — N135 Crossing vessel and stricture of ureter without hydronephrosis: Secondary | ICD-10-CM | POA: Insufficient documentation

## 2016-12-23 DIAGNOSIS — N1330 Unspecified hydronephrosis: Secondary | ICD-10-CM | POA: Insufficient documentation

## 2016-12-23 DIAGNOSIS — Z86010 Personal history of colonic polyps: Secondary | ICD-10-CM | POA: Insufficient documentation

## 2016-12-23 DIAGNOSIS — Z880 Allergy status to penicillin: Secondary | ICD-10-CM | POA: Insufficient documentation

## 2016-12-23 DIAGNOSIS — L231 Allergic contact dermatitis due to adhesives: Secondary | ICD-10-CM | POA: Insufficient documentation

## 2016-12-23 DIAGNOSIS — M79644 Pain in right finger(s): Secondary | ICD-10-CM | POA: Insufficient documentation

## 2016-12-23 DIAGNOSIS — Z9889 Other specified postprocedural states: Secondary | ICD-10-CM | POA: Insufficient documentation

## 2016-12-23 DIAGNOSIS — Z6379 Other stressful life events affecting family and household: Secondary | ICD-10-CM

## 2016-12-23 DIAGNOSIS — Z800 Family history of malignant neoplasm of digestive organs: Secondary | ICD-10-CM | POA: Insufficient documentation

## 2016-12-23 DIAGNOSIS — Z79899 Other long term (current) drug therapy: Secondary | ICD-10-CM | POA: Insufficient documentation

## 2016-12-23 DIAGNOSIS — E785 Hyperlipidemia, unspecified: Secondary | ICD-10-CM | POA: Insufficient documentation

## 2016-12-23 DIAGNOSIS — R42 Dizziness and giddiness: Secondary | ICD-10-CM | POA: Insufficient documentation

## 2016-12-23 DIAGNOSIS — G8929 Other chronic pain: Secondary | ICD-10-CM | POA: Insufficient documentation

## 2016-12-23 DIAGNOSIS — M545 Low back pain, unspecified: Secondary | ICD-10-CM

## 2016-12-23 DIAGNOSIS — K31819 Angiodysplasia of stomach and duodenum without bleeding: Secondary | ICD-10-CM | POA: Insufficient documentation

## 2016-12-23 DIAGNOSIS — M5412 Radiculopathy, cervical region: Secondary | ICD-10-CM | POA: Insufficient documentation

## 2016-12-23 DIAGNOSIS — Z8249 Family history of ischemic heart disease and other diseases of the circulatory system: Secondary | ICD-10-CM | POA: Insufficient documentation

## 2016-12-23 DIAGNOSIS — R300 Dysuria: Secondary | ICD-10-CM | POA: Insufficient documentation

## 2016-12-23 DIAGNOSIS — K589 Irritable bowel syndrome without diarrhea: Secondary | ICD-10-CM | POA: Insufficient documentation

## 2016-12-23 DIAGNOSIS — J302 Other seasonal allergic rhinitis: Secondary | ICD-10-CM | POA: Insufficient documentation

## 2016-12-23 DIAGNOSIS — Z801 Family history of malignant neoplasm of trachea, bronchus and lung: Secondary | ICD-10-CM | POA: Insufficient documentation

## 2016-12-23 DIAGNOSIS — B181 Chronic viral hepatitis B without delta-agent: Secondary | ICD-10-CM | POA: Insufficient documentation

## 2016-12-23 DIAGNOSIS — R1032 Left lower quadrant pain: Secondary | ICD-10-CM | POA: Insufficient documentation

## 2016-12-23 DIAGNOSIS — M519 Unspecified thoracic, thoracolumbar and lumbosacral intervertebral disc disorder: Secondary | ICD-10-CM | POA: Insufficient documentation

## 2016-12-23 DIAGNOSIS — M722 Plantar fascial fibromatosis: Secondary | ICD-10-CM | POA: Insufficient documentation

## 2016-12-23 DIAGNOSIS — Z888 Allergy status to other drugs, medicaments and biological substances status: Secondary | ICD-10-CM | POA: Insufficient documentation

## 2016-12-23 LAB — POCT URINALYSIS DIPSTICK
Bilirubin, UA: NEGATIVE
Blood, UA: NEGATIVE
GLUCOSE UA: NEGATIVE
KETONES UA: NEGATIVE
Leukocytes, UA: NEGATIVE
Nitrite, UA: NEGATIVE
Protein, UA: NEGATIVE
SPEC GRAV UA: 1.015 (ref 1.010–1.025)
Urobilinogen, UA: 0.2 E.U./dL
pH, UA: 7 (ref 5.0–8.0)

## 2016-12-23 MED ORDER — DICLOFENAC SODIUM 1 % TD GEL: 2.0000 g | Freq: Four times a day (QID) | TRANSDERMAL | 0 refills | Status: DC

## 2016-12-23 MED ORDER — HYDROCHLOROTHIAZIDE 25 MG PO TABS: 25.0000 mg | ORAL_TABLET | Freq: Every day | ORAL | 3 refills | Status: DC

## 2016-12-23 MED FILL — VOLTAREN 1% GEL: 1 | 12 days supply | Qty: 100 | Fill #0

## 2016-12-23 NOTE — Progress Notes (Signed)
Patient ID: Jacqueline Orozco, female    DOB: 11-28-1950  MRN: [id number]  CC: re-establish and Back Pain   Subjective: Jacqueline Orozco is a 66 y.o. female who presents to become est with me as PCP. PCP was Dr. Adrian Blackwater Her concerns today include:  Hx of Chronic LBP due to disc ds, HTN, HL, L hydronephrosis s/p pyeloplasty 06/2016, chronic hep B  1. LBP: -she has not had x-rays done as yet. Plans to go tomorrow. -no call as yet for P.T but looks like she is in the [employer] work Q -pain worse with prolong standing, heavy lifting.  -active around her house and does exercises as recommended by Dr. Adrian Blackwater  2. Chronic pain LT lower pelvis since urologic procedure 06/2016.  No worse than before. Only feels it when she bends over -intermittent burning with urination. Last episode was yesterday. -no fever -saw urologist 2 wks ago and reportedly had U/S in office which was okay.  -Flomax d/c   3.HTN: compliant with meds  4. Stressed in caring for her husband. Has a daughter who helps  Patient Active Problem List   Diagnosis Date Noted  . Allergic contact dermatitis due to adhesives 07/02/2016  . Hydronephrosis with ureteropelvic junction (UPJ) obstruction 04/15/2016  . Pain of right thumb 11/23/2015  . Osteoarthritis of right wrist 07/28/2015  . Gastric and duodenal angiodysplasia   . Seasonal allergies 09/15/2014  . IBS (irritable bowel syndrome) 06/20/2014  . Lumbar disc disease 09/29/2013  . Gastric AVM 12/29/2012  . Hx of adenomatous colonic polyps 10/16/2012  . Plantar fasciitis, right 05/19/2012  . Cervical radiculitis 02/12/2012  . Vertigo 05/22/2011  . HEPATITIS B, CHRONIC 03/28/2010  . HLD (hyperlipidemia) 03/28/2010  . Essential hypertension 03/28/2010     Current Outpatient Prescriptions on File Prior to Visit  Medication Sig Dispense Refill  . acetaminophen (TYLENOL 8 HOUR) 650 MG CR tablet Take 1 tablet (650 mg total) by mouth every 8 (eight) hours as needed for pain.  90 tablet 1  . amLODipine (NORVASC) 10 MG tablet TAKE 1 TABLET BY MOUTH DAILY 90 tablet 3  . atorvastatin (LIPITOR) 20 MG tablet TAKE 1 TABLET BY MOUTH DAILY 90 tablet 0  . Calcium Citrate 250 MG TABS Take 2 tablets (500 mg total) by mouth daily. 60 tablet 11  . calcium citrate-vitamin D 500-400 MG-UNIT chewable tablet Chew 1 tablet by mouth 2 (two) times daily. 180 tablet 1  . cetirizine (ZYRTEC) 10 MG tablet Take 1 tablet (10 mg total) by mouth daily. 30 tablet 11  . dicyclomine (BENTYL) 20 MG tablet TAKE 1 TABLET BY MOUTH 3 TIMES DAILY BEFORE MEALS. 90 tablet 11  . omeprazole (PRILOSEC) 40 MG capsule TAKE ONE CAPSULE BY MOUTH DAILY 90 capsule 3  . polyethylene glycol powder (GLYCOLAX/MIRALAX) powder Take 17 g by mouth daily. 510 g 11  . triamcinolone cream (KENALOG) 0.1 % Apply 1 application topically 2 (two) times daily. 30 g 0   No current facility-administered medications on file prior to visit.     Allergies  Allergen Reactions  . Aspirin Other (See Comments)    stomach pain, stomach bleeding  . Penicillins Nausea And Vomiting and Other (See Comments)    Dizzy Has patient had a PCN reaction causing immediate rash, facial/tongue/throat swelling, SOB or lightheadedness with hypotension: No Has patient had a PCN reaction causing severe rash involving mucus membranes or skin necrosis: No Has patient had a PCN reaction that required hospitalization; No Has patient had  a PCN reaction occurring within the last 10 years: No If all of the above answers are "NO", then may proceed with Cephalosporin use.   . Latex Itching  . Streptomycin Nausea And Vomiting and Rash  . Tramadol Nausea Only    Social History   Social History  . Marital status: Married    Spouse name: N/A  . Number of children: 6  . Years of education: 46    Occupational History  . Unemployed     Social History Main Topics  . Smoking status: Never Smoker  . Smokeless tobacco: Never Used  . Alcohol use Yes      Comment: occasional wine  . Drug use: No  . Sexual activity: Yes    Birth control/ protection: None   Other Topics Concern  . Not on file   Social History Narrative   From [place].   Lived in [place] since 1994.    Live with husband.   6 adult children.    Speaks some English and reads some  [place].     Family History  Problem Relation Age of Onset  . Hypertension Mother   . Stomach cancer Father   . Liver disease Maternal Uncle   . Lung cancer Maternal Grandmother     Past Surgical History:  Procedure Laterality Date  . COLONOSCOPY WITH PROPOFOL N/A 01/03/2015   Procedure: COLONOSCOPY WITH PROPOFOL;  Surgeon: Jerene Bears, MD;  Location: [facility];  Service: Gastroenterology;  Laterality: N/A;  . CYSTOSCOPY W/ URETERAL STENT PLACEMENT Left 06/17/2016   Procedure: CYSTOSCOPY WITH RETROGRADE PYELOGRAM/URETERAL STENT PLACEMENT;  Surgeon: Raynelle Bring, MD;  Location: [facility];  Service: Urology;  Laterality: Left;  . ESOPHAGOGASTRODUODENOSCOPY (EGD) WITH PROPOFOL N/A 01/03/2015   Procedure: ESOPHAGOGASTRODUODENOSCOPY (EGD) WITH PROPOFOL;  Surgeon: Jerene Bears, MD;  Location: [facility];  Service: Gastroenterology;  Laterality: N/A;  . ESOPHAGOGASTRODUODENOSCOPY ENDOSCOPY     several times  . HOT HEMOSTASIS N/A 01/03/2015   Procedure: HOT HEMOSTASIS (ARGON PLASMA COAGULATION/BICAP);  Surgeon: Jerene Bears, MD;  Location: [facility];  Service: Gastroenterology;  Laterality: N/A;  . NO PAST SURGERIES    . ROBOT ASSISTED PYELOPLASTY Left 06/17/2016   Procedure: XI ROBOTIC ASSISTED PYELOPLASTY;  Surgeon: Raynelle Bring, MD;  Location: [facility];  Service: Urology;  Laterality: Left;    ROS: Review of Systems  Respiratory: Negative for cough and shortness of breath.   Cardiovascular: Negative for chest pain.  Psychiatric/Behavioral: Positive for dysphoric mood.    PHYSICAL EXAM: BP 119/75   Pulse 61   Temp 98.3 F (36.8 C) (Oral)   Resp 16   Wt 147 lb 12.8 oz (67 kg)   SpO2 99%    BMI 26.18 kg/m   Physical Exam General appearance - alert, well appearing, and in no distress Mental status - alert, oriented to person, place, and time, normal mood, behavior, speech, dress, motor activity, and thought processes Mouth - mucous membranes moist, pharynx normal without lesions Neck - supple, no significant adenopathy Chest - clear to auscultation, no wheezes, rales or rhonchi, symmetric air entry Heart - normal rate, regular rhythm, normal S1, S2, no murmurs, rubs, clicks or gallops Abdomen - soft, nontender, nondistended, no masses or organomegaly. No guarding or rebound in LLQ Musculoskeletal -mild tenderness over upper LS spine and surrounding muscles Extremities - no LE edema  Depression screen PHQ 2/9 12/23/2016  Decreased Interest 1  Down, Depressed, Hopeless 0  PHQ - 2 Score 1  Altered sleeping -  Tired, decreased energy -  Change in appetite -  Feeling bad or failure about yourself  -  Trouble concentrating -  Moving slowly or fidgety/restless -  Suicidal thoughts -  PHQ-9 Score -  Some recent data might be hidden   GAD 7 : Generalized Anxiety Score 12/23/2016 10/22/2016 07/02/2016 05/10/2016  Nervous, Anxious, on Edge 0 0 1 0  Control/stop worrying 0 1 0 1  Worry too much - different things 0 1 1 1   Trouble relaxing 0 0 2 0  Restless 0 1 2 3   Easily annoyed or irritable 0 0 1 1  Afraid - awful might happen 0 2 1 1   Total GAD 7 Score 0 5 8 7       Chemistry      Component Value Date/Time   NA 139 07/02/2016 0942   K 3.7 07/02/2016 0942   CL 101 07/02/2016 0942   CO2 28 07/02/2016 0942   BUN 12 07/02/2016 0942   CREATININE 0.79 07/02/2016 0942      Component Value Date/Time   CALCIUM 9.7 07/02/2016 0942   ALKPHOS 66 07/02/2016 0942   AST 26 07/02/2016 0942   ALT 31 (H) 07/02/2016 0942   BILITOT 0.5 07/02/2016 0942     Lab Results  Component Value Date   WBC 6.0 07/02/2016   HGB 12.9 07/02/2016   HCT 39.0 07/02/2016   MCV 81.4 07/02/2016     PLT 242 07/02/2016   Results for orders placed or performed in visit on 12/23/16  Urinalysis Dipstick  Result Value Ref Range   Color, UA yellow    Clarity, UA clear    Glucose, UA negative    Bilirubin, UA negative    Ketones, UA negative    Spec Grav, UA 1.015 1.010 - 1.025   Blood, UA negative    pH, UA 7.0 5.0 - 8.0   Protein, UA negative    Urobilinogen, UA 0.2 0.2 or 1.0 E.U./dL   Nitrite, UA negative    Leukocytes, UA Negative Negative     ASSESSMENT AND PLAN: 1. Essential hypertension -at goal.  Continue Norvasc and HCTZ - hydrochlorothiazide (HYDRODIURIL) 25 MG tablet; Take 1 tablet (25 mg total) by mouth daily.  Dispense: 90 tablet; Refill: 3  2. Chronic bilateral low back pain without sciatica -pt to get updated x-rays this wk as ordered on last visit with Dr. Adrian Blackwater. -continue stretching exercises - diclofenac sodium (VOLTAREN) 1 % GEL; Apply 2 g topically 4 (four) times daily.  Dispense: 100 g; Refill: 0  3. Dysuria -UA okay today - Urinalysis Dipstick  4. Left lower quadrant pain -chronic with MSK component. Reassurance that recent US done by urologist revealed no obstruction.    5. Stressful life event affecting family -good support from daughter   Patient was given the opportunity to ask questions.  Patient verbalized understanding of the plan and was able to repeat key elements of the plan.   Orders Placed This Encounter  Procedures  . Urinalysis Dipstick     Requested Prescriptions   Signed Prescriptions Disp Refills  . hydrochlorothiazide (HYDRODIURIL) 25 MG tablet 90 tablet 3    Sig: Take 1 tablet (25 mg total) by mouth daily.  . diclofenac sodium (VOLTAREN) 1 % GEL 100 g 0    Sig: Apply 2 g topically 4 (four) times daily.    Return in about 4 months (around 04/25/2017).  Karle Plumber, MD, FACP

## 2016-12-23 NOTE — Patient Instructions (Addendum)
Please go to [facility] hospital to get x-rays of the lower back as ordered on last visit.  Use the Voltaren gel as needed on lower back to help with pain.

## 2016-12-24 ENCOUNTER — Ambulatory Visit (HOSPITAL_COMMUNITY)
Admission: RE | Admit: 2016-12-24 | Discharge: 2016-12-24 | Disposition: A | Discharge status: Home / Self Care | Payer: Self-pay | Source: Ambulatory Visit | Attending: Family Medicine | Admitting: Family Medicine

## 2016-12-24 DIAGNOSIS — M519 Unspecified thoracic, thoracolumbar and lumbosacral intervertebral disc disorder: Secondary | ICD-10-CM

## 2016-12-24 DIAGNOSIS — M4316 Spondylolisthesis, lumbar region: Secondary | ICD-10-CM | POA: Insufficient documentation

## 2016-12-24 DIAGNOSIS — M4310 Spondylolisthesis, site unspecified: Secondary | ICD-10-CM | POA: Insufficient documentation

## 2016-12-24 NOTE — Assessment & Plan Note (Signed)
Low back x-ray reveals anterior slippage of vertebral body at two levels, L4-L5 and L5-S1 This is mild and can be treated with rest and chiropractic manipulation Chiropractic referral placed

## 2017-01-13 MED FILL — ?CETIRIZINE HCL 10 MG TABLE: 10 | 30 days supply | Qty: 30 | Fill #2

## 2017-01-13 MED FILL — HYDROCHLOROTHIAZIDE 25 MG T: 25 | 30 days supply | Qty: 30 | Fill #6

## 2017-01-13 MED FILL — OMEPRAZOLE DR 40 MG CAPSULE: 40 | 30 days supply | Qty: 30 | Fill #5

## 2017-01-13 MED FILL — AMLODIPINE BESYLATE 10 MG T: 10 | 30 days supply | Qty: 30 | Fill #5

## 2017-01-13 MED FILL — ?DICYCLOMINE 20 MG TABLET: 20 | 30 days supply | Qty: 90 | Fill #6

## 2017-01-13 MED FILL — POLYETHYLENE GLYCOL 3350 PO: 30 days supply | Qty: 510 | Fill #2

## 2017-01-13 MED FILL — ?ATORVASTATIN 20 MG TABLET: 20 | 30 days supply | Qty: 30 | Fill #2

## 2017-01-19 ENCOUNTER — Emergency Department (HOSPITAL_COMMUNITY): Payer: Self-pay

## 2017-01-19 ENCOUNTER — Emergency Department (HOSPITAL_COMMUNITY)
Admission: EM | Admit: 2017-01-19 | Discharge: 2017-01-20 | Disposition: A | Discharge status: Home / Self Care | Payer: Self-pay | Attending: Emergency Medicine | Admitting: Emergency Medicine

## 2017-01-19 ENCOUNTER — Encounter (HOSPITAL_COMMUNITY): Payer: Self-pay | Admitting: *Deleted

## 2017-01-19 DIAGNOSIS — E876 Hypokalemia: Secondary | ICD-10-CM | POA: Insufficient documentation

## 2017-01-19 DIAGNOSIS — I10 Essential (primary) hypertension: Secondary | ICD-10-CM | POA: Insufficient documentation

## 2017-01-19 DIAGNOSIS — H81399 Other peripheral vertigo, unspecified ear: Secondary | ICD-10-CM | POA: Insufficient documentation

## 2017-01-19 DIAGNOSIS — Z880 Allergy status to penicillin: Secondary | ICD-10-CM | POA: Insufficient documentation

## 2017-01-19 DIAGNOSIS — Z91040 Latex allergy status: Secondary | ICD-10-CM | POA: Insufficient documentation

## 2017-01-19 DIAGNOSIS — Z79899 Other long term (current) drug therapy: Secondary | ICD-10-CM | POA: Insufficient documentation

## 2017-01-19 LAB — I-STAT CHEM 8, ED
BUN: 16 mg/dL (ref 6–20)
CALCIUM ION: 1.05 mmol/L — AB (ref 1.15–1.40)
CHLORIDE: 99 mmol/L — AB (ref 101–111)
CREATININE: 0.6 mg/dL (ref 0.44–1.00)
GLUCOSE: 137 mg/dL — AB (ref 65–99)
HCT: 46 % (ref 36.0–46.0)
Hemoglobin: 15.6 g/dL — ABNORMAL HIGH (ref 12.0–15.0)
Potassium: 2.9 mmol/L — ABNORMAL LOW (ref 3.5–5.1)
Sodium: 137 mmol/L (ref 135–145)
TCO2: 24 mmol/L (ref 0–100)

## 2017-01-19 LAB — COMPREHENSIVE METABOLIC PANEL
ALBUMIN: 4.4 g/dL (ref 3.5–5.0)
ALT: 27 U/L (ref 14–54)
AST: 34 U/L (ref 15–41)
Alkaline Phosphatase: 73 U/L (ref 38–126)
Anion gap: 16 — ABNORMAL HIGH (ref 5–15)
BUN: 13 mg/dL (ref 6–20)
CHLORIDE: 98 mmol/L — AB (ref 101–111)
CO2: 21 mmol/L — ABNORMAL LOW (ref 22–32)
CREATININE: 0.83 mg/dL (ref 0.44–1.00)
Calcium: 9.7 mg/dL (ref 8.9–10.3)
GFR calc Af Amer: >60 mL/min (ref >60)
GLUCOSE: 136 mg/dL — AB (ref 65–99)
POTASSIUM: 2.7 mmol/L — AB (ref 3.5–5.1)
Sodium: 135 mmol/L (ref 135–145)
Total Bilirubin: 0.9 mg/dL (ref 0.3–1.2)
Total Protein: 8.5 g/dL — ABNORMAL HIGH (ref 6.5–8.1)

## 2017-01-19 LAB — TROPONIN I: Troponin I: <0.03 ng/mL (ref <0.03)

## 2017-01-19 LAB — DIFFERENTIAL
BASOS PCT: 0 %
Basophils Absolute: 0 10*3/uL (ref 0.0–0.1)
EOS ABS: 0 10*3/uL (ref 0.0–0.7)
Eosinophils Relative: 0 %
LYMPHS ABS: 1.6 10*3/uL (ref 0.7–4.0)
Lymphocytes Relative: 24 %
MONOS PCT: 4 %
Monocytes Absolute: 0.3 10*3/uL (ref 0.1–1.0)
NEUTROS ABS: 4.9 10*3/uL (ref 1.7–7.7)
Neutrophils Relative %: 72 %

## 2017-01-19 LAB — PROTIME-INR
INR: 1.01
Prothrombin Time: 13.3 seconds (ref 11.4–15.2)

## 2017-01-19 LAB — CBC
HEMATOCRIT: 41 % (ref 36.0–46.0)
HEMOGLOBIN: 14.2 g/dL (ref 12.0–15.0)
MCH: 26.8 pg (ref 26.0–34.0)
MCHC: 34.6 g/dL (ref 30.0–36.0)
MCV: 77.5 fL — ABNORMAL LOW (ref 78.0–100.0)
Platelets: 171 10*3/uL (ref 150–400)
RBC: 5.29 MIL/uL — ABNORMAL HIGH (ref 3.87–5.11)
RDW: 13.3 % (ref 11.5–15.5)
WBC: 6.8 10*3/uL (ref 4.0–10.5)

## 2017-01-19 LAB — APTT: aPTT: 30 seconds (ref 24–36)

## 2017-01-19 MED ORDER — LORAZEPAM 2 MG/ML IJ SOLN
0.5000 mg | Freq: Once | INTRAMUSCULAR | Status: AC
  Administered 2017-01-19: 0.5 mg via INTRAVENOUS
  Filled 2017-01-19: qty 1

## 2017-01-19 MED ORDER — POTASSIUM CHLORIDE 10 MEQ/100ML IV SOLN
10.0000 meq | INTRAVENOUS | Status: AC
  Administered 2017-01-19 (×3): 10 meq via INTRAVENOUS
  Filled 2017-01-19 (×3): qty 100

## 2017-01-19 MED ORDER — SODIUM CHLORIDE 0.9 % IV BOLUS (SEPSIS)
500.0000 mL | Freq: Once | INTRAVENOUS | Status: AC
  Administered 2017-01-19: 500 mL via INTRAVENOUS

## 2017-01-19 NOTE — ED Notes (Signed)
Patient transported to MRI 

## 2017-01-19 NOTE — ED Notes (Signed)
Pt out of room.

## 2017-01-19 NOTE — ED Provider Notes (Signed)
[facility] DEPT Provider Note   CSN: [id number] Arrival date & time: 01/19/17  1539     History   Chief Complaint Chief Complaint  Patient presents with  . Emesis    HPI Jacqueline Orozco is a 66 y.o. female.  HPI Patient with acute onset of dizziness described as off balance starting last night. She has some mild nausea and numbness to her left upper extremity. Says she's had this type of dizziness in the past. She endorses sinus pressure and ringing in bilateral ears. No recent fever chills. Past Medical History:  Diagnosis Date  . Adenomatous colon polyp   . Allergy   . Anemia   . 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 Blood transfusion without reported diagnosis   . Cold sore   . Fatty liver   . Gastric AVM   . GERD (gastroesophageal reflux disease)   . HEPATITIS B, CHRONIC 03/28/2010  . HYPERLIPIDEMIA 03/28/2010  . HYPERTENSION 03/28/2010  . IBS (irritable bowel syndrome)   . Internal hemorrhoids   . Lumbar disc disease 09/29/2013  . PONV (postoperative nausea and vomiting)    headache also    Patient Active Problem List   Diagnosis Date Noted  . 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 Anterolisthesis 12/24/2016  . Allergic contact dermatitis due to adhesives 07/02/2016  . Hydronephrosis with ureteropelvic junction (UPJ) obstruction 04/15/2016  . Pain of right thumb 11/23/2015  . Osteoarthritis of right wrist 07/28/2015  . Gastric and duodenal angiodysplasia   . Seasonal allergies 09/15/2014  . IBS (irritable bowel syndrome) 06/20/2014  . Lumbar disc disease 09/29/2013  . Gastric AVM 12/29/2012  . Hx of adenomatous colonic polyps 10/16/2012  . Plantar fasciitis, right 05/19/2012  . Cervical radiculitis 02/12/2012  . Vertigo 05/22/2011  . HEPATITIS B, CHRONIC 03/28/2010  . HLD (hyperlipidemia) 03/28/2010  . Essential hypertension 03/28/2010    Past Surgical History:  Procedure Laterality Date  . COLONOSCOPY WITH PROPOFOL N/A 01/03/2015   Procedure: COLONOSCOPY WITH  PROPOFOL;  Surgeon: Jerene Bears, MD;  Location: [facility];  Service: Gastroenterology;  Laterality: N/A;  . CYSTOSCOPY W/ URETERAL STENT PLACEMENT Left 06/17/2016   Procedure: CYSTOSCOPY WITH RETROGRADE PYELOGRAM/URETERAL STENT PLACEMENT;  Surgeon: Raynelle Bring, MD;  Location: [facility];  Service: Urology;  Laterality: Left;  . ESOPHAGOGASTRODUODENOSCOPY (EGD) WITH PROPOFOL N/A 01/03/2015   Procedure: ESOPHAGOGASTRODUODENOSCOPY (EGD) WITH PROPOFOL;  Surgeon: Jerene Bears, MD;  Location: [facility];  Service: Gastroenterology;  Laterality: N/A;  . ESOPHAGOGASTRODUODENOSCOPY ENDOSCOPY     several times  . HOT HEMOSTASIS N/A 01/03/2015   Procedure: HOT HEMOSTASIS (ARGON PLASMA COAGULATION/BICAP);  Surgeon: Jerene Bears, MD;  Location: [facility];  Service: Gastroenterology;  Laterality: N/A;  . NO PAST SURGERIES    . ROBOT ASSISTED PYELOPLASTY Left 06/17/2016   Procedure: XI ROBOTIC ASSISTED PYELOPLASTY;  Surgeon: Raynelle Bring, MD;  Location: [facility];  Service: Urology;  Laterality: Left;    OB History    No data available       Home Medications    Prior to Admission medications   Medication Sig Start Date End Date Taking? Authorizing Provider  acetaminophen (TYLENOL 8 HOUR) 650 MG CR tablet Take 1 tablet (650 mg total) by mouth every 8 (eight) hours as needed for pain. 07/28/15  Yes Funches, Josalyn, MD  amLODipine (NORVASC) 10 MG tablet TAKE 1 TABLET BY MOUTH DAILY 08/16/16  Yes Funches, Josalyn, MD  atorvastatin (LIPITOR) 20 MG tablet TAKE 1 TABLET BY MOUTH DAILY 10/16/16  Yes Boykin Nearing, MD  Calcium Citrate 250 MG TABS Take 2 tablets (500 mg total) by mouth daily. 10/31/16  Yes Funches, Adriana Mccallum, MD  calcium citrate-vitamin D 500-400 MG-UNIT chewable tablet Chew 1 tablet by mouth 2 (two) times daily. 10/22/16  Yes Funches, Josalyn, MD  diclofenac sodium (VOLTAREN) 1 % GEL Apply 2 g topically 4 (four) times daily. 12/23/16  Yes Ladell Pier, MD  dicyclomine (BENTYL) 20 MG tablet TAKE  1 TABLET BY MOUTH 3 TIMES DAILY BEFORE MEALS. 07/19/16  Yes Funches, Josalyn, MD  hydrochlorothiazide (HYDRODIURIL) 25 MG tablet Take 1 tablet (25 mg total) by mouth daily. 12/23/16  Yes Ladell Pier, MD  omeprazole (PRILOSEC) 40 MG capsule TAKE ONE CAPSULE BY MOUTH DAILY 08/16/16  Yes Funches, Josalyn, MD  polyethylene glycol powder (GLYCOLAX/MIRALAX) powder Take 17 g by mouth daily. 10/21/16  Yes Funches, Josalyn, MD  triamcinolone cream (KENALOG) 0.1 % Apply 1 application topically 2 (two) times daily. 07/02/16  Yes Funches, Josalyn, MD  cetirizine (ZYRTEC) 10 MG tablet Take 1 tablet (10 mg total) by mouth daily. 10/22/16   Funches, Adriana Mccallum, MD  meclizine (ANTIVERT) 25 MG tablet Take 1 tablet (25 mg total) by mouth 3 (three) times daily as needed for dizziness or nausea. 01/20/17   Julianne Rice, MD  potassium chloride SA (K-DUR,KLOR-CON) 20 MEQ tablet Take 1 tablet (20 mEq total) by mouth daily. 01/20/17   Julianne Rice, MD    Family History Family History  Problem Relation Age of Onset  . Hypertension Mother   . Stomach cancer Father   . Liver disease Maternal Uncle   . Lung cancer Maternal Grandmother     Social History Social History  Substance Use Topics  . Smoking status: Never Smoker  . Smokeless tobacco: Never Used  . Alcohol use Yes     Comment: occasional wine     Allergies   Aspirin; Penicillins; Latex; Streptomycin; and Tramadol   Review of Systems Review of Systems  Constitutional: Negative for chills and fever.  HENT: Positive for congestion, sinus pressure and tinnitus. Negative for ear pain.   Eyes: Negative for visual disturbance.  Respiratory: Negative for cough and shortness of breath.   Cardiovascular: Negative for chest pain, palpitations and leg swelling.  Gastrointestinal: Positive for nausea and vomiting. Negative for abdominal pain and diarrhea.  Genitourinary: Negative for flank pain and frequency.  Musculoskeletal: Positive for gait problem.  Negative for arthralgias, myalgias, neck pain and neck stiffness.  Skin: Negative for rash and wound.  Neurological: Positive for dizziness, light-headedness and numbness. Negative for weakness and headaches.  Psychiatric/Behavioral: The patient is nervous/anxious.   All other systems reviewed and are negative.    Physical Exam Updated Vital Signs BP 115/75 (BP Location: Left Arm)   Pulse 69   Temp 97.9 F (36.6 C) (Oral)   Resp 18   Ht 5\' 2"  (1.575 m)   Wt 68.9 kg (152 lb)   SpO2 98%   BMI 27.80 kg/m   Physical Exam  Constitutional: She is oriented to person, place, and time. She appears well-developed and well-nourished.  HENT:  Head: Normocephalic and atraumatic.  Mouth/Throat: Oropharynx is clear and moist. No oropharyngeal exudate.  Eyes: Pupils are equal, round, and reactive to light. EOM are normal.  Few beats of fatigable horizontal nystagmus  Neck: Normal range of motion. Neck supple.  Cardiovascular: Normal rate and regular rhythm.  Exam reveals no gallop and no friction rub.   No murmur heard. Pulmonary/Chest: Effort normal and breath sounds normal. No respiratory  distress. She has no wheezes. She has no rales. She exhibits no tenderness.  Abdominal: Soft. Bowel sounds are normal. There is no tenderness. There is no rebound and no guarding.  Musculoskeletal: Normal range of motion. She exhibits no edema or tenderness.  No lower extremity swelling, asymmetry or tenderness. Distal pulses are 2+.  Lymphadenopathy:    She has no cervical adenopathy.  Neurological: She is alert and oriented to person, place, and time.  Diminished sensation to light touch to the left shoulder and left upper extremity. Questionable ataxia with bilateral finger to nose testing. 5/5 motor in all extremities.  Skin: Skin is warm and dry. Capillary refill takes less than 2 seconds. No rash noted. No erythema.  Psychiatric: Her behavior is normal.  Very anxious appearing  Nursing note and  vitals reviewed.    ED Treatments / Results  Labs (all labs ordered are listed, but only abnormal results are displayed) Labs Reviewed  CBC - Abnormal; Notable for the following:       Result Value   RBC 5.29 (*)    MCV 77.5 (*)    All other components within normal limits  COMPREHENSIVE METABOLIC PANEL - Abnormal; Notable for the following:    Potassium 2.7 (*)    Chloride 98 (*)    CO2 21 (*)    Glucose, Bld 136 (*)    Total Protein 8.5 (*)    Anion gap 16 (*)    All other components within normal limits  I-STAT CHEM 8, ED - Abnormal; Notable for the following:    Potassium 2.9 (*)    Chloride 99 (*)    Glucose, Bld 137 (*)    Calcium, Ion 1.05 (*)    Hemoglobin 15.6 (*)    All other components within normal limits  PROTIME-INR  APTT  DIFFERENTIAL  TROPONIN I    EKG  EKG Interpretation  Date/Time:  Sunday January 19 2017 15:48:53 EDT Ventricular Rate:  77 PR Interval:  140 QRS Duration: 86 QT Interval:  436 QTC Calculation: 493 R Axis:   72 Text Interpretation:  Normal sinus rhythm with sinus arrhythmia Cannot rule out Anterior infarct , age undetermined Abnormal ECG Confirmed by Lita Mains  MD, Delmon Andrada ([id number]) on 01/19/2017 4:22:49 PM       Radiology No results found.  Procedures Procedures (including critical care time)  Medications Ordered in ED Medications  LORazepam (ATIVAN) injection 0.5 mg (0.5 mg Intravenous Given 01/19/17 1757)  potassium chloride 10 mEq in 100 mL IVPB (0 mEq Intravenous Stopped 01/19/17 2304)  sodium chloride 0.9 % bolus 500 mL (0 mLs Intravenous Stopped 01/19/17 2203)  meclizine (ANTIVERT) tablet 25 mg (25 mg Oral Given 01/20/17 0047)  potassium chloride SA (K-DUR,KLOR-CON) CR tablet 40 mEq (40 mEq Oral Given 01/20/17 0048)     Initial Impression / Assessment and Plan / ED Course  I have reviewed the triage vital signs and the nursing notes.  Pertinent labs & imaging results that were available during my care of the patient were  reviewed by me and considered in my medical decision making (see chart for details).    MRI without acute findings. Symptoms have improved. Potassium has been replaced. We'll give neurology follow-up. Return precautions given.   Final Clinical Impressions(s) / ED Diagnoses   Final diagnoses:  Peripheral vertigo, unspecified laterality  Hypokalemia    New Prescriptions Discharge Medication List as of 01/20/2017 12:13 AM    START taking these medications   Details  meclizine (ANTIVERT) 25  MG tablet Take 1 tablet (25 mg total) by mouth 3 (three) times daily as needed for dizziness or nausea., Starting Mon 01/20/2017, Print    potassium chloride SA (K-DUR,KLOR-CON) 20 MEQ tablet Take 1 tablet (20 mEq total) by mouth daily., Starting Mon 01/20/2017, Print         Julianne Rice, MD 01/24/17 [phone]

## 2017-01-19 NOTE — ED Triage Notes (Signed)
The pt has had dizziness nausea vomiting and lt arm num bness since last night.  No previous history  Unsteady gait from the dizziness when she walks

## 2017-01-20 MED ORDER — MECLIZINE HCL 25 MG PO TABS
25.0000 mg | ORAL_TABLET | Freq: Once | ORAL | Status: AC
  Administered 2017-01-20: 25 mg via ORAL
  Filled 2017-01-20: qty 1

## 2017-01-20 MED ORDER — MECLIZINE HCL 25 MG PO TABS: 25.0000 mg | ORAL_TABLET | Freq: Three times a day (TID) | ORAL | 0 refills | Status: DC | PRN

## 2017-01-20 MED ORDER — POTASSIUM CHLORIDE CRYS ER 20 MEQ PO TBCR
40.0000 meq | EXTENDED_RELEASE_TABLET | Freq: Once | ORAL | Status: AC
  Administered 2017-01-20: 40 meq via ORAL
  Filled 2017-01-20: qty 2

## 2017-01-20 MED ORDER — POTASSIUM CHLORIDE CRYS ER 20 MEQ PO TBCR: 20.0000 meq | EXTENDED_RELEASE_TABLET | Freq: Every day | ORAL | 0 refills | Status: DC

## 2017-01-20 MED FILL — TRAVEL SICKNESS 25 MG TAB C: 25 | 10 days supply | Qty: 30 | Fill #0

## 2017-01-20 MED FILL — POTASSIUM CL ER 20 MEQ TAB: 20 | 3 days supply | Qty: 3 | Fill #0

## 2017-01-21 ENCOUNTER — Ambulatory Visit (INDEPENDENT_AMBULATORY_CARE_PROVIDER_SITE_OTHER): Payer: Self-pay | Admitting: Neurology

## 2017-01-21 ENCOUNTER — Encounter: Payer: Self-pay | Admitting: Neurology

## 2017-01-21 VITALS — BP 110/70 | HR 67 | Ht 62.0 in | Wt 147.5 lb

## 2017-01-21 DIAGNOSIS — R42 Dizziness and giddiness: Secondary | ICD-10-CM

## 2017-01-21 DIAGNOSIS — G43109 Migraine with aura, not intractable, without status migrainosus: Secondary | ICD-10-CM

## 2017-01-21 DIAGNOSIS — Z5181 Encounter for therapeutic drug level monitoring: Secondary | ICD-10-CM

## 2017-01-21 HISTORY — DX: Migraine with aura, not intractable, without status migrainosus: G43.109

## 2017-01-21 NOTE — Progress Notes (Signed)
Reason for visit: Vertigo  Referring physician: [place]  Jacqueline Orozco is a 66 y.o. female  History of present illness:  Jacqueline Orozco is a 66 year old right-handed Asian female with a history of migraine headaches as a child and throughout her life. The patient also indicates that she has significant issues with car sickness, if she is not driving the car she gets sick very easily. The patient has had 3 events within the last one year of headache associated with vertigo. The first event was in the fall of 2017, a second event was 2 or 3 months ago and the third event was on 01/19/2017. The patient has had some left arm and leg numbness with these events as well. The patient may have some bright flashing lights in the vision, she has blurring of vision and she has a headache that comes up in the back of the neck into the back of the head. The patient has significant nausea, she has photophobia with the events. The patient went to the emergency room when her headache lasted for more than 24 hours. She underwent blood work that showed a low potassium level of 2.7, the patient did report some cramping in the left leg. The patient underwent MRI of the brain that did not show evidence of an acute stroke. The patient claims that the headache is better, she still having some residual vertigo. The patient still has some residual left arm and leg numbness. She comes to this office for an evaluation. She has not reported any changes in speech, sometimes she feels tight in the throat and has difficulty talking or singing or swallowing. An interpreter was not required for this evaluation.  Past Medical History:  Diagnosis Date  . Adenomatous colon polyp   . Allergy   . Anemia   . Blood transfusion without reported diagnosis   . Cold sore   . Fatty liver   . Gastric AVM   . GERD (gastroesophageal reflux disease)   . HEPATITIS B, CHRONIC 03/28/2010  . HYPERLIPIDEMIA 03/28/2010  . HYPERTENSION 03/28/2010  .  IBS (irritable bowel syndrome)   . Internal hemorrhoids   . Lumbar disc disease 09/29/2013  . PONV (postoperative nausea and vomiting)    headache also    Past Surgical History:  Procedure Laterality Date  . COLONOSCOPY WITH PROPOFOL N/A 01/03/2015   Procedure: COLONOSCOPY WITH PROPOFOL;  Surgeon: Jerene Bears, MD;  Location: [facility];  Service: Gastroenterology;  Laterality: N/A;  . CYSTOSCOPY W/ URETERAL STENT PLACEMENT Left 06/17/2016   Procedure: CYSTOSCOPY WITH RETROGRADE PYELOGRAM/URETERAL STENT PLACEMENT;  Surgeon: Raynelle Bring, MD;  Location: [facility];  Service: Urology;  Laterality: Left;  . ESOPHAGOGASTRODUODENOSCOPY (EGD) WITH PROPOFOL N/A 01/03/2015   Procedure: ESOPHAGOGASTRODUODENOSCOPY (EGD) WITH PROPOFOL;  Surgeon: Jerene Bears, MD;  Location: [facility];  Service: Gastroenterology;  Laterality: N/A;  . ESOPHAGOGASTRODUODENOSCOPY ENDOSCOPY     several times  . HOT HEMOSTASIS N/A 01/03/2015   Procedure: HOT HEMOSTASIS (ARGON PLASMA COAGULATION/BICAP);  Surgeon: Jerene Bears, MD;  Location: [facility];  Service: Gastroenterology;  Laterality: N/A;  . NO PAST SURGERIES    . ROBOT ASSISTED PYELOPLASTY Left 06/17/2016   Procedure: XI ROBOTIC ASSISTED PYELOPLASTY;  Surgeon: Raynelle Bring, MD;  Location: [facility];  Service: Urology;  Laterality: Left;    Family History  Problem Relation Age of Onset  . Hypertension Mother   . Stomach cancer Father   . Liver disease Maternal Uncle   . Lung cancer Maternal  Grandmother     Social history:  reports that she has never smoked. She has never used smokeless tobacco. She reports that she drinks alcohol. She reports that she does not use drugs.  Medications:  Prior to Admission medications   Medication Sig Start Date End Date Taking? Authorizing Provider  acetaminophen (TYLENOL 8 HOUR) 650 MG CR tablet Take 1 tablet (650 mg total) by mouth every 8 (eight) hours as needed for pain. 07/28/15  Yes Funches, Josalyn, MD  amLODipine  (NORVASC) 10 MG tablet TAKE 1 TABLET BY MOUTH DAILY 08/16/16  Yes Funches, Josalyn, MD  atorvastatin (LIPITOR) 20 MG tablet TAKE 1 TABLET BY MOUTH DAILY 10/16/16  Yes Funches, Josalyn, MD  Calcium Citrate 250 MG TABS Take 2 tablets (500 mg total) by mouth daily. 10/31/16  Yes Funches, Adriana Mccallum, MD  calcium citrate-vitamin D 500-400 MG-UNIT chewable tablet Chew 1 tablet by mouth 2 (two) times daily. 10/22/16  Yes Funches, Josalyn, MD  cetirizine (ZYRTEC) 10 MG tablet Take 1 tablet (10 mg total) by mouth daily. 10/22/16  Yes Funches, Josalyn, MD  diclofenac sodium (VOLTAREN) 1 % GEL Apply 2 g topically 4 (four) times daily. 12/23/16  Yes Ladell Pier, MD  dicyclomine (BENTYL) 20 MG tablet TAKE 1 TABLET BY MOUTH 3 TIMES DAILY BEFORE MEALS. 07/19/16  Yes Funches, Josalyn, MD  hydrochlorothiazide (HYDRODIURIL) 25 MG tablet Take 1 tablet (25 mg total) by mouth daily. 12/23/16  Yes Ladell Pier, MD  meclizine (ANTIVERT) 25 MG tablet Take 1 tablet (25 mg total) by mouth 3 (three) times daily as needed for dizziness or nausea. 01/20/17  Yes Julianne Rice, MD  omeprazole (PRILOSEC) 40 MG capsule TAKE ONE CAPSULE BY MOUTH DAILY 08/16/16  Yes Funches, Josalyn, MD  polyethylene glycol powder (GLYCOLAX/MIRALAX) powder Take 17 g by mouth daily. 10/21/16  Yes Funches, Josalyn, MD  potassium chloride SA (K-DUR,KLOR-CON) 20 MEQ tablet Take 1 tablet (20 mEq total) by mouth daily. 01/20/17  Yes Julianne Rice, MD  triamcinolone cream (KENALOG) 0.1 % Apply 1 application topically 2 (two) times daily. 07/02/16  Yes Funches, Josalyn, MD        ROS:  Out of a complete 14 system review of symptoms, the patient complains only of the following symptoms, and all other reviewed systems are negative.  Chills Blurred vision Swelling in the legs Easy bruising Feeling cold Headache, numbness, weakness, dizziness Not enough sleep  Blood pressure 110/70, pulse 67, height 5\' 2"  (1.575 m), weight 147 lb 8 oz (66.9 kg), SpO2  98 %.  Physical Exam  General: The patient is alert and cooperative at the time of the examination.  Eyes: Pupils are equal, round, and reactive to light. Discs are flat bilaterally.  Neck: The neck is supple, no carotid bruits are noted.  Respiratory: The respiratory examination is clear.  Cardiovascular: The cardiovascular examination reveals a regular rate and rhythm, no obvious murmurs or rubs are noted.  Skin: Extremities are without significant edema.  Neurologic Exam  Mental status: The patient is alert and oriented x 3 at the time of the examination. The patient has apparent normal recent and remote memory, with an apparently normal attention span and concentration ability.  Cranial nerves: Facial symmetry is present. There is good sensation of the face to pinprick and soft touch bilaterally. The strength of the facial muscles and the muscles to head turning and shoulder shrug are normal bilaterally. Speech is well enunciated, no aphasia or dysarthria is noted. Extraocular movements are full. Visual fields  are full. The tongue is midline, and the patient has symmetric elevation of the soft palate. No obvious hearing deficits are noted.  Motor: The motor testing reveals 5 over 5 strength of all 4 extremities. Good symmetric motor tone is noted throughout.  Sensory: Sensory testing is intact to pinprick, soft touch, vibration sensation, and position sense on all 4 extremities, with exception of some decreased pinprick sensation on the left forearm. No evidence of extinction is noted.  Coordination: Cerebellar testing reveals good finger-nose-finger and heel-to-shin bilaterally.  Gait and station: Gait is normal. Tandem gait is normal. Romberg is negative. No drift is seen.  Reflexes: Deep tendon reflexes are symmetric and normal bilaterally. Toes are downgoing bilaterally.   MRI brain 01/19/17:  IMPRESSION: 1. T1, DWI, and T2 sequences were acquired. 2. No acute/early  subacute infarct or focal mass effect. No definite intracranial hemorrhage. 3. Mild chronic microvascular ischemic changes and mild parenchymal volume loss of the brain.   * MRI scan images were reviewed online. I agree with the written report.    Assessment/Plan:  1. Probable basilar migraine  2. Hypokalemia  The patient appears to have headaches associated with vertigo, nausea and vomiting. The patient has also reported some focal symptoms of left arm and leg numbness. MRI of the brain done recently was unremarkable. The patient has had headaches off and on throughout her entire life. The events are relatively infrequent, the patient has had only 3 events over the last year. The patient may benefit from a short course of dexamethasone if she has another episode. Given the focal symptoms, she will undergo a carotid Doppler study. She will follow-up in 3 months. Blood work will be done to recheck the potassium level.  Jill Alexanders MD 01/21/2017 11:41 AM  [facility] [address] [place], [place] [address]  Phone [phone] Fax [phone]

## 2017-01-21 NOTE — Patient Instructions (Signed)
    We will check a carotid doppler study to look at the circulation to the brain.

## 2017-01-22 ENCOUNTER — Other Ambulatory Visit: Payer: Self-pay | Admitting: *Deleted

## 2017-01-22 DIAGNOSIS — R42 Dizziness and giddiness: Secondary | ICD-10-CM

## 2017-01-22 LAB — COMPREHENSIVE METABOLIC PANEL
ALBUMIN: 4.9 g/dL — AB (ref 3.6–4.8)
ALT: 24 IU/L (ref 0–32)
AST: 26 IU/L (ref 0–40)
Albumin/Globulin Ratio: 1.4 (ref 1.2–2.2)
Alkaline Phosphatase: 81 IU/L (ref 39–117)
BUN/Creatinine Ratio: 13 (ref 12–28)
BUN: 12 mg/dL (ref 8–27)
Bilirubin Total: 0.4 mg/dL (ref 0.0–1.2)
CALCIUM: 10.1 mg/dL (ref 8.7–10.3)
CO2: 25 mmol/L (ref 20–29)
Chloride: 101 mmol/L (ref 96–106)
Creatinine, Ser: 0.89 mg/dL (ref 0.57–1.00)
GFR, EST AFRICAN AMERICAN: 80 mL/min/{1.73_m2} (ref >59)
GFR, EST NON AFRICAN AMERICAN: 69 mL/min/{1.73_m2} (ref >59)
GLUCOSE: 107 mg/dL — AB (ref 65–99)
Globulin, Total: 3.4 g/dL (ref 1.5–4.5)
Potassium: 3.7 mmol/L (ref 3.5–5.2)
Sodium: 141 mmol/L (ref 134–144)
TOTAL PROTEIN: 8.3 g/dL (ref 6.0–8.5)

## 2017-01-28 ENCOUNTER — Ambulatory Visit (HOSPITAL_COMMUNITY)
Admission: RE | Admit: 2017-01-28 | Discharge: 2017-01-28 | Disposition: A | Discharge status: Home / Self Care | Payer: Self-pay | Source: Ambulatory Visit | Attending: Neurology | Admitting: Neurology

## 2017-01-28 ENCOUNTER — Telehealth: Payer: Self-pay | Admitting: Neurology

## 2017-01-28 DIAGNOSIS — R42 Dizziness and giddiness: Secondary | ICD-10-CM | POA: Insufficient documentation

## 2017-01-28 DIAGNOSIS — R200 Anesthesia of skin: Secondary | ICD-10-CM | POA: Insufficient documentation

## 2017-01-28 LAB — VAS US CAROTID
LCCADDIAS: -28 cm/s
LEFT ECA DIAS: -24 cm/s
LEFT VERTEBRAL DIAS: 18 cm/s
LICADSYS: -41 cm/s
LICAPDIAS: -26 cm/s
Left CCA dist sys: -77 cm/s
Left CCA prox dias: 31 cm/s
Left CCA prox sys: 115 cm/s
Left ICA dist dias: -14 cm/s
Left ICA prox sys: -64 cm/s
RCCADSYS: -65 cm/s
RCCAPDIAS: -25 cm/s
RIGHT ECA DIAS: -21 cm/s
RIGHT VERTEBRAL DIAS: 13 cm/s
Right CCA prox sys: -94 cm/s

## 2017-01-28 NOTE — Progress Notes (Signed)
*  PRELIMINARY RESULTS* Vascular Ultrasound Carotid Duplex (Doppler) has been completed.  Preliminary findings: Bilateral 1-39% ICA stenosis, antegrade vertebral flow.   Everrett Coombe 01/28/2017, 9:14 AM

## 2017-01-28 NOTE — Telephone Encounter (Signed)
I called the husband. The carotid Doppler study was unremarkable.   Carotid doppler 01/28/17:  Summary:  - The vertebral arteries appear patent with antegrade flow. - Findings consistent with a 1-39 percent stenosis involving the right internal carotid artery and the left internal carotid artery.

## 2017-02-11 ENCOUNTER — Ambulatory Visit: Payer: Self-pay | Attending: Internal Medicine

## 2017-02-14 ENCOUNTER — Other Ambulatory Visit: Payer: Self-pay | Admitting: Family Medicine

## 2017-02-14 DIAGNOSIS — E785 Hyperlipidemia, unspecified: Secondary | ICD-10-CM

## 2017-02-14 MED FILL — HYDROCHLOROTHIAZIDE 25 MG T: 25 | 30 days supply | Qty: 30 | Fill #7

## 2017-02-14 MED FILL — ATORVASTATIN 20 MG TABLET: 20 | 30 days supply | Qty: 30 | Fill #0

## 2017-02-14 MED FILL — POLYETHYLENE GLYCOL 3350 PO: 30 days supply | Qty: 510 | Fill #3

## 2017-02-14 MED FILL — ?DICYCLOMINE 20 MG TABLET: 20 | 30 days supply | Qty: 90 | Fill #7

## 2017-02-14 MED FILL — AMLODIPINE BESYLATE 10 MG T: 10 | 30 days supply | Qty: 30 | Fill #6

## 2017-02-14 MED FILL — OMEPRAZOLE DR 40 MG CAPSULE: 40 | 30 days supply | Qty: 30 | Fill #6

## 2017-03-17 MED FILL — HYDROCHLOROTHIAZIDE 25 MG T: 25 | 30 days supply | Qty: 30 | Fill #8

## 2017-03-17 MED FILL — POLYETHYLENE GLYCOL 3350 PO: 30 days supply | Qty: 510 | Fill #4

## 2017-03-17 MED FILL — OMEPRAZOLE DR 40 MG CAPSULE: 40 | 30 days supply | Qty: 30 | Fill #7

## 2017-03-17 MED FILL — ?ATORVASTATIN 20 MG TABLET: 20 | 30 days supply | Qty: 30 | Fill #1

## 2017-03-17 MED FILL — ?DICYCLOMINE 20 MG TABLET: 20 | 30 days supply | Qty: 90 | Fill #8

## 2017-03-17 MED FILL — AMLODIPINE BESYLATE 10 MG T: 10 | 30 days supply | Qty: 30 | Fill #7

## 2017-04-18 MED FILL — AMLODIPINE BESYLATE 10 MG T: 10 | 30 days supply | Qty: 30 | Fill #8

## 2017-04-18 MED FILL — ?DICYCLOMINE 20 MG TABLET: 20 | 30 days supply | Qty: 90 | Fill #9

## 2017-04-18 MED FILL — HYDROCHLOROTHIAZIDE 25 MG T: 25 | 30 days supply | Qty: 30 | Fill #9

## 2017-04-18 MED FILL — ?ATORVASTATIN 20 MG TABLET: 20 | 30 days supply | Qty: 30 | Fill #2

## 2017-04-18 MED FILL — POLYETHYLENE GLYCOL 3350: 30 days supply | Qty: 510 | Fill #5

## 2017-04-18 MED FILL — OMEPRAZOLE DR 40 MG CAPSULE: 40 | 30 days supply | Qty: 30 | Fill #8

## 2017-04-24 ENCOUNTER — Ambulatory Visit: Payer: Self-pay | Admitting: Adult Health

## 2017-04-28 ENCOUNTER — Ambulatory Visit: Payer: Self-pay | Attending: Internal Medicine

## 2017-05-20 ENCOUNTER — Other Ambulatory Visit: Payer: Self-pay | Admitting: Internal Medicine

## 2017-05-20 DIAGNOSIS — E785 Hyperlipidemia, unspecified: Secondary | ICD-10-CM

## 2017-05-20 MED FILL — OMEPRAZOLE DR 40 MG CAPSULE: 40 | 30 days supply | Qty: 30 | Fill #9

## 2017-05-20 MED FILL — ?DICYCLOMINE 20 MG TABLET: 20 | 30 days supply | Qty: 90 | Fill #10

## 2017-05-20 MED FILL — AMLODIPINE BESYLATE 10 MG T: 10 | 30 days supply | Qty: 30 | Fill #9

## 2017-05-20 MED FILL — POLYETHYLENE GLYCOL 3350 PO: 28 days supply | Qty: 476 | Fill #6

## 2017-05-20 MED FILL — HYDROCHLOROTHIAZIDE 25 MG T: 25 | 30 days supply | Qty: 30 | Fill #10

## 2017-05-21 MED FILL — ?ATORVASTATIN 20 MG TABLET: 20 | 30 days supply | Qty: 30 | Fill #0

## 2017-06-18 ENCOUNTER — Other Ambulatory Visit: Payer: Self-pay | Admitting: Internal Medicine

## 2017-06-18 DIAGNOSIS — E785 Hyperlipidemia, unspecified: Secondary | ICD-10-CM

## 2017-06-18 MED FILL — HYDROCHLOROTHIAZIDE 25 MG T: 25 | 30 days supply | Qty: 30 | Fill #11

## 2017-06-18 MED FILL — DICYCLOMINE 20 MG TABLET: 20 | 30 days supply | Qty: 90 | Fill #11

## 2017-06-18 MED FILL — AMLODIPINE BESYLATE 10 MG T: 10 | 30 days supply | Qty: 30 | Fill #10

## 2017-06-18 MED FILL — ?ATORVASTATIN 20 MG TABLET: 20 | 30 days supply | Qty: 30 | Fill #0

## 2017-06-18 MED FILL — POLYETHYLENE GLYCOL 3350 PO: 30 days supply | Qty: 510 | Fill #7

## 2017-06-18 MED FILL — OMEPRAZOLE DR 40 MG CAPSULE: 40 | 30 days supply | Qty: 30 | Fill #10

## 2017-07-05 IMAGING — DX DG CHEST 2V
2 series · 2 of 2 positions shown · non-contrast
Comparison: PA and lateral chest x-ray May 22, 2011

CLINICAL DATA: Onset of cough 2 weeks ago following
hospitalization. Patient porch left-sided chest discomfort -pain.
History of urinary tract surgery including hila plasty on June 17, 2016.

EXAM:
CHEST  2 VIEW

[chest pa]
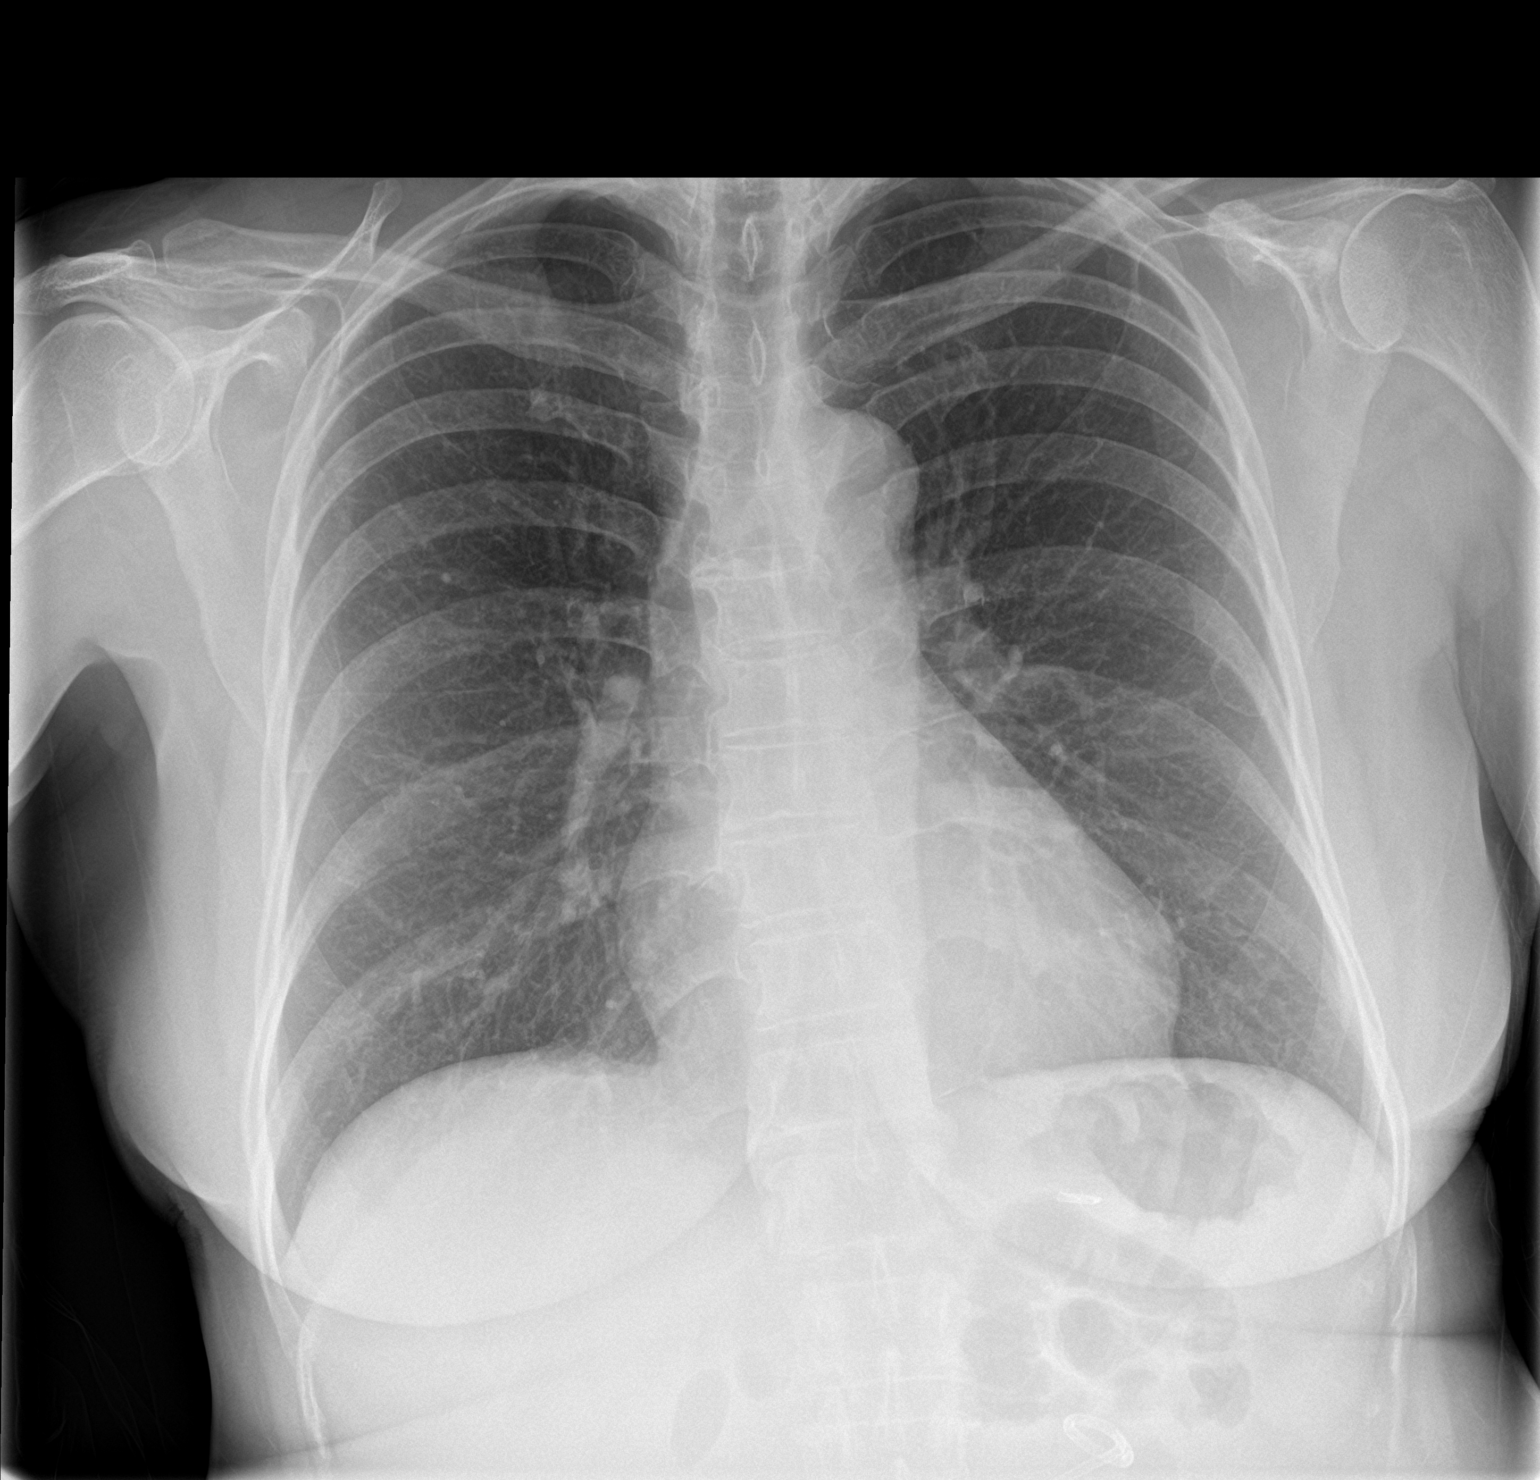

[chest lat]
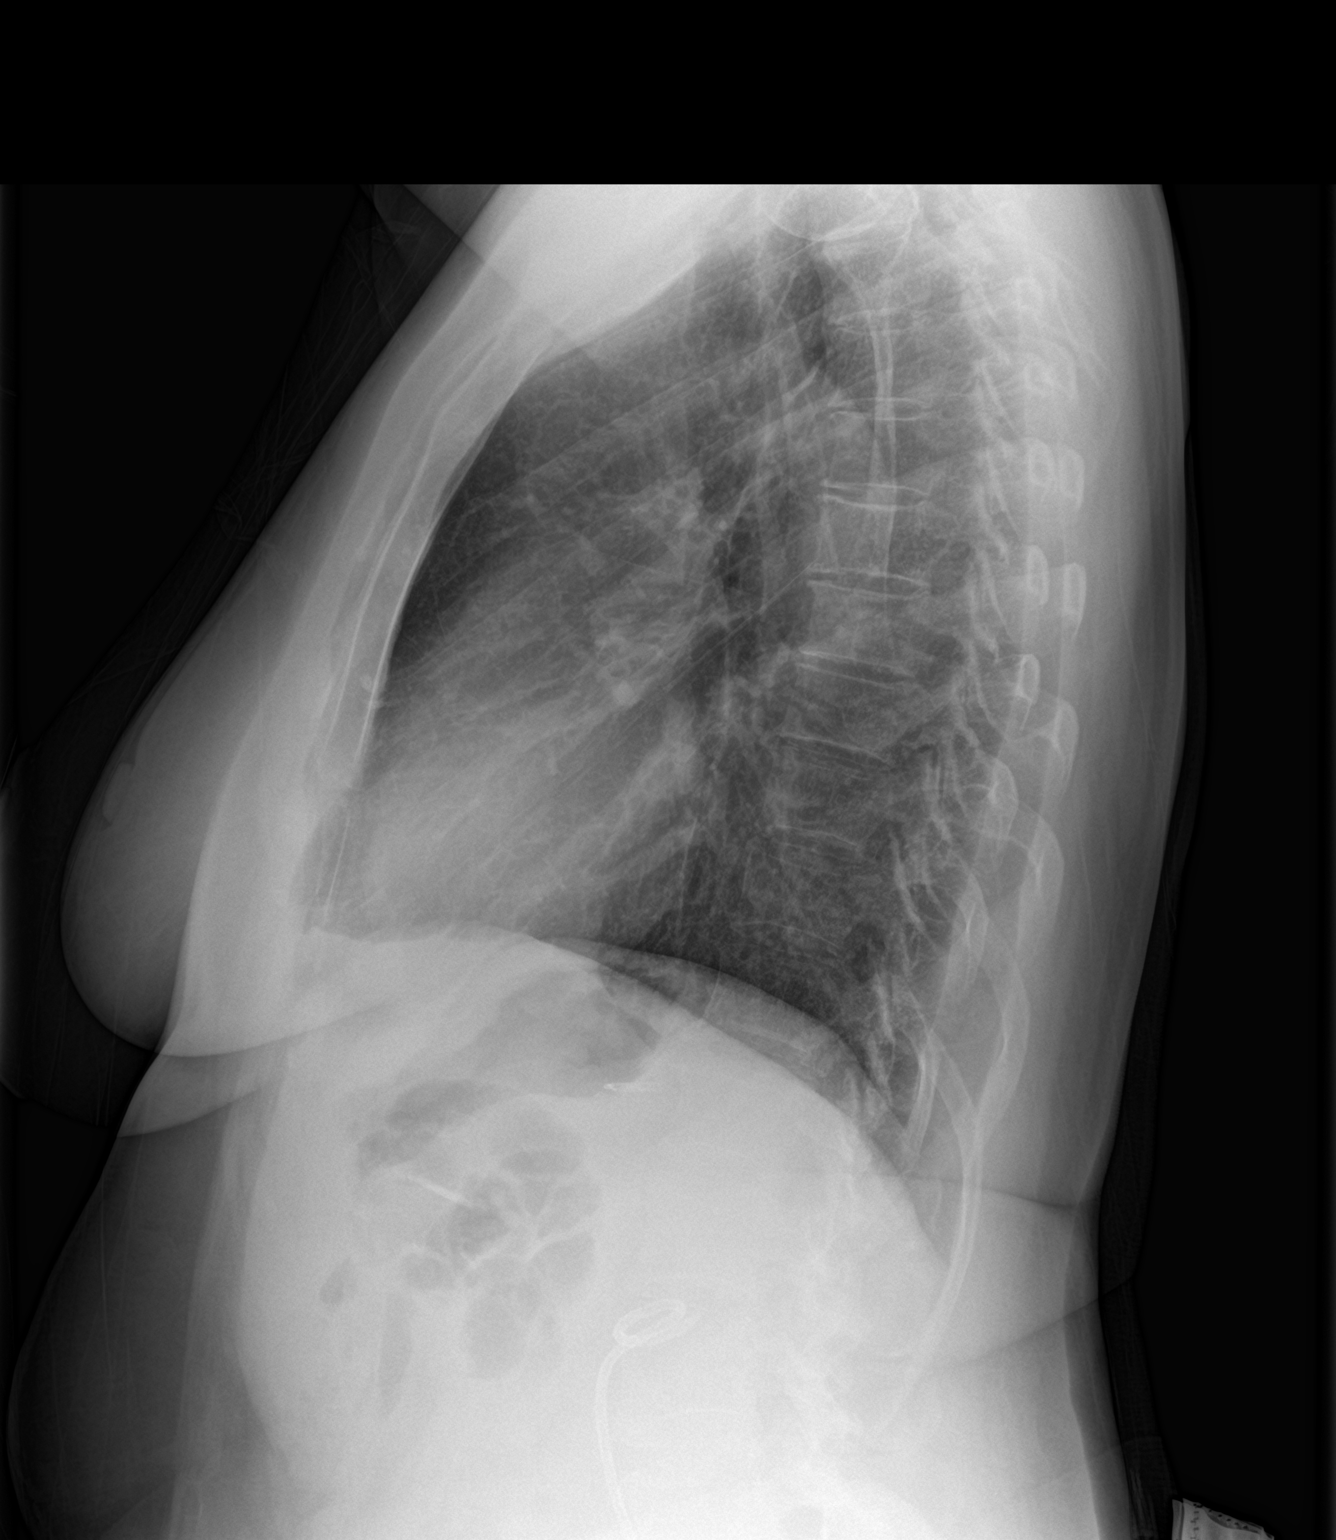

[2 of 2 positions shown; findings below may reference images not displayed]

FINDINGS: The lungs are well-expanded and clear. The heart and pulmonary
vascularity are normal. The mediastinum is normal in width. There is
no pleural effusion. The bony thorax exhibits no acute abnormality.
IMPRESSION: There is no acute cardiopulmonary abnormality.

## 2017-07-15 ENCOUNTER — Other Ambulatory Visit: Payer: Self-pay

## 2017-07-15 ENCOUNTER — Emergency Department (HOSPITAL_COMMUNITY): Payer: Self-pay

## 2017-07-15 ENCOUNTER — Emergency Department (HOSPITAL_COMMUNITY)
Admission: EM | Admit: 2017-07-15 | Discharge: 2017-07-16 | Disposition: A | Discharge status: Home / Self Care | Payer: Self-pay | Attending: Emergency Medicine | Admitting: Emergency Medicine

## 2017-07-15 ENCOUNTER — Encounter (HOSPITAL_COMMUNITY): Payer: Self-pay

## 2017-07-15 DIAGNOSIS — R197 Diarrhea, unspecified: Secondary | ICD-10-CM | POA: Insufficient documentation

## 2017-07-15 DIAGNOSIS — I10 Essential (primary) hypertension: Secondary | ICD-10-CM | POA: Insufficient documentation

## 2017-07-15 DIAGNOSIS — Z79899 Other long term (current) drug therapy: Secondary | ICD-10-CM | POA: Insufficient documentation

## 2017-07-15 DIAGNOSIS — R1011 Right upper quadrant pain: Secondary | ICD-10-CM

## 2017-07-15 DIAGNOSIS — E876 Hypokalemia: Secondary | ICD-10-CM | POA: Insufficient documentation

## 2017-07-15 DIAGNOSIS — R112 Nausea with vomiting, unspecified: Secondary | ICD-10-CM | POA: Insufficient documentation

## 2017-07-15 LAB — COMPREHENSIVE METABOLIC PANEL
ALBUMIN: 4.4 g/dL (ref 3.5–5.0)
ALK PHOS: 72 U/L (ref 38–126)
ALT: 22 U/L (ref 14–54)
ANION GAP: 13 (ref 5–15)
AST: 28 U/L (ref 15–41)
BUN: 11 mg/dL (ref 6–20)
CALCIUM: 9.6 mg/dL (ref 8.9–10.3)
CHLORIDE: 101 mmol/L (ref 101–111)
CO2: 24 mmol/L (ref 22–32)
Creatinine, Ser: 0.77 mg/dL (ref 0.44–1.00)
GFR calc Af Amer: >60 mL/min (ref >60)
GFR calc non Af Amer: >60 mL/min (ref >60)
GLUCOSE: 137 mg/dL — AB (ref 65–99)
Potassium: 2.3 mmol/L — CL (ref 3.5–5.1)
SODIUM: 138 mmol/L (ref 135–145)
Total Bilirubin: 0.7 mg/dL (ref 0.3–1.2)
Total Protein: 8.3 g/dL — ABNORMAL HIGH (ref 6.5–8.1)

## 2017-07-15 LAB — CBC
HEMATOCRIT: 39.9 % (ref 36.0–46.0)
HEMOGLOBIN: 13.3 g/dL (ref 12.0–15.0)
MCH: 26.4 pg (ref 26.0–34.0)
MCHC: 33.3 g/dL (ref 30.0–36.0)
MCV: 79.2 fL (ref 78.0–100.0)
Platelets: 181 10*3/uL (ref 150–400)
RBC: 5.04 MIL/uL (ref 3.87–5.11)
RDW: 13.6 % (ref 11.5–15.5)
WBC: 8 10*3/uL (ref 4.0–10.5)

## 2017-07-15 LAB — LIPASE, BLOOD: Lipase: 36 U/L (ref 11–51)

## 2017-07-15 MED ORDER — ONDANSETRON HCL 4 MG/2ML IJ SOLN
4.0000 mg | Freq: Once | INTRAMUSCULAR | Status: AC
  Administered 2017-07-15: 4 mg via INTRAVENOUS
  Filled 2017-07-15: qty 2

## 2017-07-15 MED ORDER — POTASSIUM CHLORIDE CRYS ER 20 MEQ PO TBCR
40.0000 meq | EXTENDED_RELEASE_TABLET | Freq: Once | ORAL | Status: AC
  Administered 2017-07-15: 40 meq via ORAL
  Filled 2017-07-15: qty 2

## 2017-07-15 MED ORDER — SODIUM CHLORIDE 0.9 % IV BOLUS (SEPSIS)
500.0000 mL | Freq: Once | INTRAVENOUS | Status: AC
  Administered 2017-07-15: 500 mL via INTRAVENOUS

## 2017-07-15 MED ORDER — POTASSIUM CHLORIDE 10 MEQ/100ML IV SOLN
10.0000 meq | INTRAVENOUS | Status: AC
  Administered 2017-07-15 – 2017-07-16 (×3): 10 meq via INTRAVENOUS
  Filled 2017-07-15 (×3): qty 100

## 2017-07-15 MED ORDER — MORPHINE SULFATE (PF) 4 MG/ML IV SOLN
4.0000 mg | Freq: Once | INTRAVENOUS | Status: AC
  Administered 2017-07-15: 4 mg via INTRAVENOUS
  Filled 2017-07-15: qty 1

## 2017-07-15 MED ORDER — IOPAMIDOL (ISOVUE-300) INJECTION 61%
INTRAVENOUS | Status: AC
  Administered 2017-07-15: 100 mL
  Filled 2017-07-15: qty 100

## 2017-07-15 NOTE — ED Triage Notes (Signed)
Onset 2pm abd pain, N/V/D .  Vomited on way to ED.

## 2017-07-15 NOTE — ED Notes (Signed)
Pt appears to be lethargic in triage, pale. Pt's vitals are stable and she is AOX4.

## 2017-07-15 NOTE — ED Notes (Signed)
Patient transported to Ultrasound 

## 2017-07-15 NOTE — ED Provider Notes (Signed)
[facility] EMERGENCY DEPARTMENT Provider Note   CSN: [id number] Arrival date & time: 07/15/17  1721     History   Chief Complaint Chief Complaint  Patient presents with  . Abdominal Pain    HPI Jacqueline Orozco is a 67 y.o. female with history of GERD, gastric AVM, hepatitis B, HLD, HTN, IBS presents today for evaluation of acute onset, constant right upper quadrant abdominal pain.  She states pain began around 2 PM with associated lightheadedness but no syncope.  She states that she has vomited at least 6 times today, nonbloody nonbilious.  She has also had 3 episodes of nonbloody watery stools.  She has tried her home medications including dicyclomine without significant relief of her symptoms.  She denies fevers or chills.  She denies chest pain or shortness of breath.  Pain is burning in nature and radiates all across the upper abdomen.  She has not been able to tolerate any PO food or fluids today.  The history is provided by the patient.    Past Medical History:  Diagnosis Date  . Adenomatous colon polyp   . Allergy   . Anemia   . 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 Blood transfusion without reported diagnosis   . Cold sore   . Fatty liver   . Gastric AVM   . GERD (gastroesophageal reflux disease)   . HEPATITIS B, CHRONIC 03/28/2010  . HYPERLIPIDEMIA 03/28/2010  . HYPERTENSION 03/28/2010  . IBS (irritable bowel syndrome)   . Internal hemorrhoids   . Lumbar disc disease 09/29/2013  . PONV (postoperative nausea and vomiting)    headache also    Patient Active Problem List   Diagnosis Date Noted  . 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 Anterolisthesis 12/24/2016  . Allergic contact dermatitis due to adhesives 07/02/2016  . Hydronephrosis with ureteropelvic junction (UPJ) obstruction 04/15/2016  . Pain of right thumb 11/23/2015  . Osteoarthritis of right wrist 07/28/2015  . Gastric and duodenal angiodysplasia   . Seasonal allergies 09/15/2014  . IBS (irritable  bowel syndrome) 06/20/2014  . Lumbar disc disease 09/29/2013  . Gastric AVM 12/29/2012  . Hx of adenomatous colonic polyps 10/16/2012  . Plantar fasciitis, right 05/19/2012  . Cervical radiculitis 02/12/2012  . Vertigo 05/22/2011  . HEPATITIS B, CHRONIC 03/28/2010  . HLD (hyperlipidemia) 03/28/2010  . Essential hypertension 03/28/2010    Past Surgical History:  Procedure Laterality Date  . COLONOSCOPY WITH PROPOFOL N/A 01/03/2015   Procedure: COLONOSCOPY WITH PROPOFOL;  Surgeon: Jerene Bears, MD;  Location: [facility];  Service: Gastroenterology;  Laterality: N/A;  . CYSTOSCOPY W/ URETERAL STENT PLACEMENT Left 06/17/2016   Procedure: CYSTOSCOPY WITH RETROGRADE PYELOGRAM/URETERAL STENT PLACEMENT;  Surgeon: Raynelle Bring, MD;  Location: [facility];  Service: Urology;  Laterality: Left;  . ESOPHAGOGASTRODUODENOSCOPY (EGD) WITH PROPOFOL N/A 01/03/2015   Procedure: ESOPHAGOGASTRODUODENOSCOPY (EGD) WITH PROPOFOL;  Surgeon: Jerene Bears, MD;  Location: [facility];  Service: Gastroenterology;  Laterality: N/A;  . ESOPHAGOGASTRODUODENOSCOPY ENDOSCOPY     several times  . HOT HEMOSTASIS N/A 01/03/2015   Procedure: HOT HEMOSTASIS (ARGON PLASMA COAGULATION/BICAP);  Surgeon: Jerene Bears, MD;  Location: [facility];  Service: Gastroenterology;  Laterality: N/A;  . NO PAST SURGERIES    . ROBOT ASSISTED PYELOPLASTY Left 06/17/2016   Procedure: XI ROBOTIC ASSISTED PYELOPLASTY;  Surgeon: Raynelle Bring, MD;  Location: [facility];  Service: Urology;  Laterality: Left;    OB History    No data available       Home Medications  Prior to Admission medications   Medication Sig Start Date End Date Taking? Authorizing Provider  acetaminophen (TYLENOL 8 HOUR) 650 MG CR tablet Take 1 tablet (650 mg total) by mouth every 8 (eight) hours as needed for pain. 07/28/15   Funches, Adriana Mccallum, MD  amLODipine (NORVASC) 10 MG tablet TAKE 1 TABLET BY MOUTH DAILY 08/16/16   Boykin Nearing, MD  atorvastatin (LIPITOR) 20 MG  tablet TAKE 1 TABLET BY MOUTH DAILY 06/18/17   Ladell Pier, MD  Calcium Citrate 250 MG TABS Take 2 tablets (500 mg total) by mouth daily. 10/31/16   Boykin Nearing, MD  calcium citrate-vitamin D 500-400 MG-UNIT chewable tablet Chew 1 tablet by mouth 2 (two) times daily. 10/22/16   Funches, Adriana Mccallum, MD  cetirizine (ZYRTEC) 10 MG tablet Take 1 tablet (10 mg total) by mouth daily. 10/22/16   Funches, Adriana Mccallum, MD  diclofenac sodium (VOLTAREN) 1 % GEL Apply 2 g topically 4 (four) times daily. 12/23/16   Ladell Pier, MD  dicyclomine (BENTYL) 20 MG tablet TAKE 1 TABLET BY MOUTH 3 TIMES DAILY BEFORE MEALS. 07/19/16   Funches, Adriana Mccallum, MD  hydrochlorothiazide (HYDRODIURIL) 25 MG tablet Take 1 tablet (25 mg total) by mouth daily. 12/23/16   Ladell Pier, MD  meclizine (ANTIVERT) 25 MG tablet Take 1 tablet (25 mg total) by mouth 3 (three) times daily as needed for dizziness or nausea. 01/20/17   Julianne Rice, MD  omeprazole (PRILOSEC) 40 MG capsule TAKE ONE CAPSULE BY MOUTH DAILY 08/16/16   Funches, Adriana Mccallum, MD  polyethylene glycol powder (GLYCOLAX/MIRALAX) powder Take 17 g by mouth daily. 10/21/16   Funches, Adriana Mccallum, MD  potassium chloride SA (K-DUR,KLOR-CON) 20 MEQ tablet Take 1 tablet (20 mEq total) by mouth daily. 01/20/17   Julianne Rice, MD  triamcinolone cream (KENALOG) 0.1 % Apply 1 application topically 2 (two) times daily. 07/02/16   Boykin Nearing, MD    Family History Family History  Problem Relation Age of Onset  . Hypertension Mother   . Stomach cancer Father   . Liver disease Maternal Uncle   . Lung cancer Maternal Grandmother     Social History Social History   Tobacco Use  . Smoking status: Never Smoker  . Smokeless tobacco: Never Used  Substance Use Topics  . Alcohol use: Yes    Comment: occasional wine  . Drug use: No     Allergies   Aspirin; Penicillins; Latex; Streptomycin; and Tramadol   Review of Systems Review of Systems  Constitutional: Negative  for chills and fever.  Respiratory: Negative for shortness of breath.   Cardiovascular: Negative for chest pain.  Gastrointestinal: Positive for abdominal pain, diarrhea, nausea and vomiting.  Genitourinary: Positive for dysuria. Negative for frequency, hematuria and urgency.  All other systems reviewed and are negative.    Physical Exam Updated Vital Signs BP 115/60 (BP Location: Left Arm)   Pulse 75   Temp 98.2 F (36.8 C) (Oral)   Resp 15   SpO2 96%   Physical Exam  Constitutional: She appears well-developed and well-nourished. No distress.  HENT:  Head: Normocephalic and atraumatic.  Eyes: Conjunctivae are normal. Right eye exhibits no discharge. Left eye exhibits no discharge.  Neck: No JVD present. No tracheal deviation present.  Cardiovascular: Normal rate, regular rhythm and normal heart sounds.  Pulmonary/Chest: Effort normal and breath sounds normal.  Abdominal: Soft. Normal appearance. She exhibits no distension. Bowel sounds are decreased. There is tenderness in the right upper quadrant, right lower quadrant and epigastric area.  There is no rigidity, no rebound, no guarding, no CVA tenderness, no tenderness at McBurney's point and negative Murphy's sign.  Musculoskeletal: She exhibits no edema.  No midline spine TTP, no paraspinal muscle tenderness, no deformity, crepitus, or step-off noted   Neurological: She is alert.  Skin: Skin is warm and dry. No erythema.  Psychiatric: She has a normal mood and affect. Her behavior is normal.  Nursing note and vitals reviewed.    ED Treatments / Results  Labs (all labs ordered are listed, but only abnormal results are displayed) Labs Reviewed  COMPREHENSIVE METABOLIC PANEL - Abnormal; Notable for the following components:      Result Value   Potassium 2.3 (*)    Glucose, Bld 137 (*)    Total Protein 8.3 (*)    All other components within normal limits  LIPASE, BLOOD  CBC  URINALYSIS, ROUTINE W REFLEX MICROSCOPIC    MAGNESIUM    EKG  EKG Interpretation  Date/Time:  Tuesday July 15 2017 17:59:05 EST Ventricular Rate:  65 PR Interval:  154 QRS Duration: 80 QT Interval:  444 QTC Calculation: 461 R Axis:   78 Text Interpretation:  Normal sinus rhythm with sinus arrhythmia ST-t wave abnormality Artifact Abnormal ekg Confirmed by Carmin Muskrat [phone]) on 07/15/2017 9:58:19 PM       Radiology US Abdomen Limited Ruq  Result Date: 07/15/2017 CLINICAL DATA:  Acute onset of right upper quadrant abdominal pain. EXAM: ULTRASOUND ABDOMEN LIMITED RIGHT UPPER QUADRANT COMPARISON:  CT of the abdomen and pelvis from 07/05/2016 FINDINGS: Gallbladder: There is gallbladder wall thickening, with suggestion of a small amount of sludge in the gallbladder. No ultrasonographic Murphy's sign is elicited. No pericholecystic fluid is seen. No definite stones are identified. Common bile duct: Diameter: 0.4 cm, within normal limits in caliber. Liver: No focal lesion identified. Within normal limits in parenchymal echogenicity. Portal vein is patent on color Doppler imaging with normal direction of blood flow towards the liver. IMPRESSION: Gallbladder wall thickening, with suggestion of a small amount of sludge in the gallbladder. This may reflect chronic inflammation. No definite evidence of acute cholecystitis. No stones seen within the gallbladder. Electronically Signed   By: Garald Balding M.D.   On: 07/15/2017 23:44    Procedures Procedures (including critical care time)  Medications Ordered in ED Medications  potassium chloride 10 mEq in 100 mL IVPB (10 mEq Intravenous New Bag/Given 07/16/17 0103)  iopamidol (ISOVUE-300) 61 % injection (not administered)  sodium chloride 0.9 % bolus 500 mL (500 mLs Intravenous New Bag/Given 07/15/17 2223)  morphine 4 MG/ML injection 4 mg (4 mg Intravenous Given 07/15/17 2223)  ondansetron (ZOFRAN) injection 4 mg (4 mg Intravenous Given 07/15/17 2223)  potassium chloride SA (K-DUR,KLOR-CON)  CR tablet 40 mEq (40 mEq Oral Given 07/15/17 2223)     Initial Impression / Assessment and Plan / ED Course  I have reviewed the triage vital signs and the nursing notes.  Pertinent labs & imaging results that were available during my care of the patient were reviewed by me and considered in my medical decision making (see chart for details).  Clinical Course as of Jul 18 1509  Wed Jul 16, 2017  0420 Dr. Reesa Chew recommending recheck potassium; if >3 he recommends pt is safe for discharge w PCP and GI follow up.  [JR]  0518 Potassium improved to 3.4   [JR]    Clinical Course User Index [JR] Robinson, [place] N, PA-C   Patient presents with multiple episodes of vomiting  and diarrhea as well as persistent nausea beginning acutely today.  Maximally tender to palpation in the right upper quadrant.  Afebrile, vital signs are stable.  No leukocytosis.  Lab work is significant for hypokalemia with potassium 2.3.  No EKG changes.  She has been hypokalemic in the past but never quite this low.  She does take HCTZ which could be contributing to her hypokalemia.  Alternatively, hypokalemia may also be due to her persistent vomiting today.  She was given fluids, pain medicine, and Zofran with some improvement and on reevaluation is resting in bed.  LFTs and creatinine within normal limits.  Right upper quadrant ultrasound shows gallbladder wall thickening with a small amount of sludge in the gallbladder, no evidence of gallbladder stones.  She is followed by [facility] gastro[facility]ology but has not seen them in 2 years.  Unclear if gallbladder findings are acute versus chronic. 1:15 AM Spoke with Dr.Nandigam with gastroenterology who agrees to follow patient while she is in the hospital.  1:38AM Signed out to oncoming provider PA Quentin Cornwall.  Awaiting CT scan of the abdomen for further evaluation of the patient's symptoms and rule out of hydronephrosis.  Patient states that she had a stent procedure over 1 year ago and  has ongoing problems with her kidneys.  She is also complaining of dysuria.  Awaiting UA for further evaluation.  In the absence of any acute surgical abdominal pathology on imaging, hospitalist service will be consulted for admission for patient's significant hypokalemia and GI follow-up.  Patient seen and evaluated by Dr. Vanita Panda who agrees with assessment and plan at this time.  CRITICAL CARE Performed by: Renita Papa   Total critical care time: 40 minutes  Critical care time was exclusive of separately billable procedures and treating other patients.  Critical care was necessary to treat or prevent imminent or life-threatening deterioration.  Critical care was time spent personally by me on the following activities: development of treatment plan with patient and/or surrogate as well as nursing, discussions with consultants, evaluation of patient's response to treatment, examination of patient, obtaining history from patient or surrogate, ordering and performing treatments and interventions, ordering and review of laboratory studies, ordering and review of radiographic studies, pulse oximetry and re-evaluation of patient's condition.   Final Clinical Impressions(s) / ED Diagnoses   Final diagnoses:  RUQ pain  Hypokalemia  Nausea vomiting and diarrhea    ED Discharge Orders    None       Debroah Baller 07/16/17 8937    This was a shared visit with a mid-level provided (NP or PA).  Throughout the patient's course I was available for consultation/collaboration.  I saw the ECG (if appropriate), relevant labs and studies - I agree with the interpretation.  On my exam the patient was in no distress.  She had begun to have some relief from her persistent nausea, vomiting, was receiving her IV potassium, but had not yet gone for planned CT scan to evaluate for etiology of her nausea, vomiting, substantial hypokalemia. On signout, this study was pending. Chart review  afterwards, CT was unremarkable.       Carmin Muskrat, MD 07/18/17 [phone]

## 2017-07-16 ENCOUNTER — Emergency Department (HOSPITAL_COMMUNITY): Payer: Self-pay

## 2017-07-16 LAB — I-STAT CHEM 8, ED
BUN: 13 mg/dL (ref 6–20)
CALCIUM ION: 1.08 mmol/L — AB (ref 1.15–1.40)
CHLORIDE: 102 mmol/L (ref 101–111)
Creatinine, Ser: 0.5 mg/dL (ref 0.44–1.00)
Glucose, Bld: 116 mg/dL — ABNORMAL HIGH (ref 65–99)
HEMATOCRIT: 38 % (ref 36.0–46.0)
Hemoglobin: 12.9 g/dL (ref 12.0–15.0)
Potassium: 3.4 mmol/L — ABNORMAL LOW (ref 3.5–5.1)
SODIUM: 140 mmol/L (ref 135–145)
TCO2: 26 mmol/L (ref 22–32)

## 2017-07-16 LAB — MAGNESIUM: MAGNESIUM: 2 mg/dL (ref 1.7–2.4)

## 2017-07-16 MED ORDER — MECLIZINE HCL 25 MG PO TABS
25.0000 mg | ORAL_TABLET | Freq: Once | ORAL | Status: AC
  Administered 2017-07-16: 25 mg via ORAL
  Filled 2017-07-16: qty 1

## 2017-07-16 MED ORDER — POTASSIUM CHLORIDE 10 MEQ/100ML IV SOLN
10.0000 meq | Freq: Once | INTRAVENOUS | Status: AC
  Administered 2017-07-16: 10 meq via INTRAVENOUS

## 2017-07-16 MED ORDER — MAGNESIUM SULFATE IN D5W 1-5 GM/100ML-% IV SOLN
1.0000 g | Freq: Once | INTRAVENOUS | Status: AC
  Administered 2017-07-16: 1 g via INTRAVENOUS
  Filled 2017-07-16 (×2): qty 100

## 2017-07-16 MED ORDER — ONDANSETRON 4 MG PO TBDP: 4.0000 mg | ORAL_TABLET | Freq: Three times a day (TID) | ORAL | 0 refills | Status: DC | PRN

## 2017-07-16 MED ORDER — ONDANSETRON HCL 4 MG/2ML IJ SOLN
4.0000 mg | Freq: Once | INTRAMUSCULAR | Status: AC
  Administered 2017-07-16: 4 mg via INTRAVENOUS
  Filled 2017-07-16: qty 2

## 2017-07-16 NOTE — Discharge Instructions (Signed)
Please schedule an appointment with your gastroenterologist to follow up on your visit today. Schedule an appointment with your primary care provider to discuss your visit today as well.  You can take zofran

## 2017-07-16 NOTE — ED Notes (Signed)
Transporter reports episode of emesis while pt was in CT.

## 2017-07-16 NOTE — ED Notes (Signed)
Pt ambulated to bathroom independently, vomiting when returned to bed. Pts daughter voices concerns about taking pt home actively vomiting. PA made aware, zofran ordered

## 2017-07-16 NOTE — ED Provider Notes (Signed)
Care assumed from [facility], [place]. See her note for Full HPI and workup. Briefly, pt presenting with abd pain, N/V/D; w hx of same, followed by GI. Labs revealing hypokalemia; reported hx of hypokalemia though not that low. No acute EKG changes. Sx managed in ED. Pt given IV potassium in ED. Plan to follow up CT abd and admit for hypokalemia. Her GI was consulted, who will follow pt in hospital; though abdominal complaints do not warrant admission.   Physical Exam  BP 113/70 (BP Location: Left Arm)   Pulse 76   Temp 99 F (37.2 C) (Oral)   Resp 17   SpO2 98%   Physical Exam  Constitutional: She appears well-developed and well-nourished.  Resting comfortably   HENT:  Head: Normocephalic and atraumatic.  Eyes: Conjunctivae are normal.  Cardiovascular: Normal rate.  Pulmonary/Chest: Effort normal.  Abdominal: Soft.  Neurological: She is alert.  Skin: Skin is warm.  Psychiatric: She has a normal mood and affect. Her behavior is normal.  Nursing note and vitals reviewed.   ED Course/Procedures   Clinical Course as of Jul 16 553  Wed Jul 16, 2017  0420 Dr. Reesa Chew recommending recheck potassium; if >3 he recommends pt is safe for discharge w PCP and GI follow up.  [JR]  0518 Potassium improved to 3.4   [JR]    Clinical Course User Index [JR] Robinson, [place] N, PA-C    Procedures  MDM  CT abd without acute pathology. Pt sx improved in ED. Dr. Reesa Chew w triad consulted for admission; he recommends recheck potassium level, with goal of >3. He states if abdominal sx improved and potassium improved, pt safe for discharge.   Potassium with significant improvement to 3.4. Pt discussed with Dr. Randal Buba, who agrees with discharge. Discussed these results and plan with pt and her daughter. Recommend GI follow up, as well as PCP follow up to discuss hypokalemia. Pt's daughter mentions pt was told she has vertigo during last visit as possible cause of nausea with room-spinning dizziness with  movement. Pt began feeling nauseous again upon discharge, another dose of zafran given and meclizine for possible hx of vertigo. Recommended PCP follow up. Pt safe for discharge at this time.  Discussed results, findings, treatment and follow up. Patient advised of return precautions. Patient verbalized understanding and agreed with plan.     Robinson, [place] N, PA-C 07/16/17 3009    Robinson, [place] N, PA-C 07/16/17 2330    Carmin Muskrat, MD 07/18/17 [phone]

## 2017-07-16 NOTE — ED Notes (Signed)
Patient transported to CT 

## 2017-07-18 ENCOUNTER — Other Ambulatory Visit: Payer: Self-pay | Admitting: Internal Medicine

## 2017-07-18 DIAGNOSIS — E785 Hyperlipidemia, unspecified: Secondary | ICD-10-CM

## 2017-07-18 MED FILL — OMEPRAZOLE DR 40 MG CAPSULE: 40 | 30 days supply | Qty: 30 | Fill #11

## 2017-07-18 MED FILL — AMLODIPINE BESYLATE 10 MG T: 10 | 30 days supply | Qty: 30 | Fill #11

## 2017-07-18 MED FILL — POLYETHYLENE GLYCOL 3350 PO: 28 days supply | Qty: 476 | Fill #8

## 2017-07-18 MED FILL — HYDROCHLOROTHIAZIDE 25 MG T: 25 | 30 days supply | Qty: 30 | Fill #0

## 2017-07-21 ENCOUNTER — Other Ambulatory Visit: Payer: Self-pay | Admitting: Internal Medicine

## 2017-07-21 DIAGNOSIS — E785 Hyperlipidemia, unspecified: Secondary | ICD-10-CM

## 2017-07-22 ENCOUNTER — Telehealth: Payer: Self-pay | Admitting: Pharmacist

## 2017-07-22 DIAGNOSIS — K589 Irritable bowel syndrome without diarrhea: Secondary | ICD-10-CM

## 2017-07-22 MED ORDER — DICYCLOMINE HCL 20 MG PO TABS: ORAL_TABLET | ORAL | 0 refills | Status: DC

## 2017-07-22 NOTE — Telephone Encounter (Signed)
Received fax from [facility] requesting refill for dicyclomine. Will forward to PCP

## 2017-07-22 NOTE — Telephone Encounter (Signed)
1 RF on Bentyl.  Message sent to scheduling to let them know pt is over due for f/u visit.

## 2017-07-22 NOTE — Addendum Note (Signed)
Addended by: Karle Plumber B on: 07/22/2017 09:33 PM   Modules accepted: Orders

## 2017-07-23 MED FILL — DICYCLOMINE 20 MG TABLET: 20 | 30 days supply | Qty: 90 | Fill #0

## 2017-07-25 ENCOUNTER — Encounter: Payer: Self-pay | Admitting: Internal Medicine

## 2017-07-25 ENCOUNTER — Ambulatory Visit: Payer: Self-pay | Attending: Internal Medicine | Admitting: Internal Medicine

## 2017-07-25 VITALS — BP 122/75 | HR 68 | Temp 98.1°F | Resp 16 | Wt 151.8 lb

## 2017-07-25 DIAGNOSIS — H8102 Meniere's disease, left ear: Secondary | ICD-10-CM

## 2017-07-25 DIAGNOSIS — E876 Hypokalemia: Secondary | ICD-10-CM | POA: Insufficient documentation

## 2017-07-25 DIAGNOSIS — R42 Dizziness and giddiness: Secondary | ICD-10-CM | POA: Insufficient documentation

## 2017-07-25 DIAGNOSIS — M19031 Primary osteoarthritis, right wrist: Secondary | ICD-10-CM | POA: Insufficient documentation

## 2017-07-25 DIAGNOSIS — G8929 Other chronic pain: Secondary | ICD-10-CM | POA: Insufficient documentation

## 2017-07-25 DIAGNOSIS — K589 Irritable bowel syndrome without diarrhea: Secondary | ICD-10-CM | POA: Insufficient documentation

## 2017-07-25 DIAGNOSIS — M5412 Radiculopathy, cervical region: Secondary | ICD-10-CM | POA: Insufficient documentation

## 2017-07-25 DIAGNOSIS — H8109 Meniere's disease, unspecified ear: Secondary | ICD-10-CM | POA: Insufficient documentation

## 2017-07-25 DIAGNOSIS — B181 Chronic viral hepatitis B without delta-agent: Secondary | ICD-10-CM | POA: Insufficient documentation

## 2017-07-25 DIAGNOSIS — I10 Essential (primary) hypertension: Secondary | ICD-10-CM | POA: Insufficient documentation

## 2017-07-25 DIAGNOSIS — E785 Hyperlipidemia, unspecified: Secondary | ICD-10-CM | POA: Insufficient documentation

## 2017-07-25 DIAGNOSIS — Z79899 Other long term (current) drug therapy: Secondary | ICD-10-CM | POA: Insufficient documentation

## 2017-07-25 DIAGNOSIS — G43109 Migraine with aura, not intractable, without status migrainosus: Secondary | ICD-10-CM | POA: Insufficient documentation

## 2017-07-25 MED ORDER — ATORVASTATIN CALCIUM 20 MG PO TABS: 20.0000 mg | ORAL_TABLET | Freq: Every day | ORAL | 6 refills | Status: DC

## 2017-07-25 MED ORDER — LOSARTAN POTASSIUM 25 MG PO TABS
25.0000 mg | ORAL_TABLET | Freq: Every day | ORAL | 5 refills | Status: DC
Start: 2017-07-25 — End: 2018-01-20

## 2017-07-25 MED FILL — LOSARTAN POTASSIUM 25 MG TA: 25 | 30 days supply | Qty: 30 | Fill #0

## 2017-07-25 MED FILL — ATORVASTATIN 20 MG TABLET: 20 | 30 days supply | Qty: 30 | Fill #0

## 2017-07-25 NOTE — Patient Instructions (Addendum)
Stop hydrochlorothiazide and potassium.  Try to drink several glasses of water a day to keep yourself hydrated.  I have added a new blood pressure medicine for you call Losartan.  Take it once a day.  Use the Meclizine once a day as needed for dizziness.   Meniere Disease Meniere disease is an inner ear disorder. It causes attacks of a spinning sensation (vertigo), dizziness, and ringing in the ear (tinnitus). It also causes hearing loss and a feeling of fullness or pressure in the ear. This is a lifelong condition, and it may get worse over time. You may have drop attacks or severe dizziness that makes you fall. A drop attack is when you suddenly fall without losing consciousness and you quickly recover after a few seconds or minutes. What are the causes? This condition is caused by having too much of the fluid that is in your inner ear (endolymph). When fluid builds up in your inner ear, it affects the nerves that control balance and hearing. The reason for the fluid buildup is not known. Possible causes include:  Allergies.  An abnormal reaction of the body's defense system (autoimmune disease).  Viral infection of the inner ear.  Head injury.  What increases the risk? You are more likely to develop this condition if:  You are older than age 54.  You have a family history of Meniere disease.  You have a history of autoimmune disease.  You have a history of migraine headaches.  What are the signs or symptoms? Symptoms of this condition can come and go and may last for up to 4 hours at a time. Symptoms usually start in one ear. They may become more frequent and eventually involve both ears. Symptoms can include:  Fullness and pressure in your ear.  Roaring or ringing in your ear.  Vertigo and loss of balance.  Dizziness.  Decreased hearing.  Nausea and vomiting.  How is this diagnosed? This condition is diagnosed based on:  A physical exam.  Tests , such as: ? A  hearing test (audiogram). ? An electronystagmogram. This tests your balance nerve (vestibular nerve). ? Imaging studies of your inner ear, such as CT scan or MRI. ? Other balance tests, such as rotational or balance platform tests.  How is this treated? There is no cure for this condition, but treatment can help to manage your symptoms. Treatment may include:  A low-salt diet. Limiting salt may help to reduce fluid in the body and relieve symptoms.  Oral or injected medicines to reduce or control: ? Vertigo. ? Nausea. ? Fluid retention. ? Dizziness.  Use of an air pressure pulse generator. This is a machine that sends small pressure pulses into your ear canal.  Hearing aids.  Inner ear surgery. This is rare.  When you have symptoms, it can be helpful to lie down on a flat surface and focus your eyes on one object that does not move. Try to stay in that position until your symptoms go away. Follow these instructions at home: Eating and drinking  Eat the same amount of food at the same time every day, including snacks.  Do not skip meals.  Avoid caffeine.  Drink enough fluids to keep your urine clear or pale yellow.  Limit alcoholic drinks to one drink a day for non-pregnant women and 2 drinks a day for men. One drink equals 12 oz of beer, 5 oz of wine, or 1 oz of hard liquor.  Limit the salt (sodium) in  your diet as told by your health care provider. Check ingredients and nutrition facts on packaged foods and beverages.  Do not eat foods that contain monosodium glutamate (MSG). General instructions  Do not use any products that contain nicotine or tobacco, such as cigarettes and e-cigarettes. If you need help quitting, ask your health care provider.  Take over-the-counter and prescription medicines only as told by your health care provider.  Find ways to reduce or avoid stress. If you need help with this, ask your health care provider.  Do not drive if you have vertigo  or dizziness. Contact a health care provider if:  You have symptoms that last longer than 4 hours.  You have new or worse symptoms. Get help right away if:  You have been vomiting for 24 hours.  You cannot keep fluids down.  You have chest pain or trouble breathing. Summary  Meniere disease is an inner ear disorder. It causes attacks of a spinning sensation (vertigo), dizziness, and ringing in the ear (tinnitus). It also causes hearing loss and a feeling of fullness or pressure in the ear.  Symptoms of this condition can come and go and may last for up to 4 hours at a time.  When you have symptoms, it can be helpful to lie down on a flat surface and focus your eyes on one object that does not move. Try to stay in that position until your symptoms go away. This information is not intended to replace advice given to you by your health care provider. Make sure you discuss any questions you have with your health care provider. Document Released: 05/24/2000 Document Revised: 04/17/2016 Document Reviewed: 04/17/2016 Elsevier Interactive Patient Education  2017 [employer].

## 2017-07-25 NOTE — Progress Notes (Signed)
Patient ID: Jacqueline Orozco, female    DOB: June 02, 1951  MRN: [id number]  CC: Hospitalization Follow-up   Subjective: Jacqueline Orozco is a 67 y.o. female who presents for ER f/u Her concerns today include:  Hx of Chronic LBP due to disc ds, HTN, HL, L hydronephrosis s/p pyeloplasty 06/2016, chronic hep B Migraines  1.  Pt seen in ER 07/15/2017 for acute onset N/V and dizziness.  Reports also abdominal pain with 3 loose stools that morning.  Labs ok except for low K+.  Lipase nl.  U/S abd revealed GB wall thickening suggestive of sludge, no cholecystitis.  CT scan was negative for any acute findings. -for 3 days prior to this she had dec hearing and pain in LT ear.  Used some oil in the ear to try to decrease pain.  Still having a little pain. Intermittent -feels better since ER but still gets dizziness intermittently almost daily.  Last 10-15 mins.  Can occur at anytime, not necessarily with position changes.  "Feels like a noise in my head."  -endorses ringing in LT ear with dizziness -Reports similar episode of nausea, vomiting and vertigo last year where she had to be taken to urgent care.  Patient Active Problem List   Diagnosis Date Noted  . 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 Anterolisthesis 12/24/2016  . Allergic contact dermatitis due to adhesives 07/02/2016  . Hydronephrosis with ureteropelvic junction (UPJ) obstruction 04/15/2016  . Pain of right thumb 11/23/2015  . Osteoarthritis of right wrist 07/28/2015  . Gastric and duodenal angiodysplasia   . Seasonal allergies 09/15/2014  . IBS (irritable bowel syndrome) 06/20/2014  . Lumbar disc disease 09/29/2013  . Gastric AVM 12/29/2012  . Hx of adenomatous colonic polyps 10/16/2012  . Plantar fasciitis, right 05/19/2012  . Cervical radiculitis 02/12/2012  . Vertigo 05/22/2011  . HEPATITIS B, CHRONIC 03/28/2010  . HLD (hyperlipidemia) 03/28/2010  . Essential hypertension 03/28/2010     Current Outpatient Medications on File Prior to Visit    Medication Sig Dispense Refill  . acetaminophen (TYLENOL 8 HOUR) 650 MG CR tablet Take 1 tablet (650 mg total) by mouth every 8 (eight) hours as needed for pain. 90 tablet 1  . amLODipine (NORVASC) 10 MG tablet TAKE 1 TABLET BY MOUTH DAILY 90 tablet 3  . Calcium Citrate 250 MG TABS Take 2 tablets (500 mg total) by mouth daily. 60 tablet 11  . calcium citrate-vitamin D 500-400 MG-UNIT chewable tablet Chew 1 tablet by mouth 2 (two) times daily. 180 tablet 1  . cetirizine (ZYRTEC) 10 MG tablet Take 1 tablet (10 mg total) by mouth daily. 30 tablet 11  . diclofenac sodium (VOLTAREN) 1 % GEL Apply 2 g topically 4 (four) times daily. 100 g 0  . dicyclomine (BENTYL) 20 MG tablet TAKE 1 TABLET BY MOUTH 3 TIMES DAILY BEFORE MEALS.  Needs to be seen by PCP prior to next RF request. 90 tablet 0  . meclizine (ANTIVERT) 25 MG tablet Take 1 tablet (25 mg total) by mouth 3 (three) times daily as needed for dizziness or nausea. (Patient not taking: Reported on 07/25/2017) 30 tablet 0  . omeprazole (PRILOSEC) 40 MG capsule TAKE ONE CAPSULE BY MOUTH DAILY 90 capsule 3   No current facility-administered medications on file prior to visit.     Allergies  Allergen Reactions  . Aspirin Other (See Comments)    stomach pain, stomach bleeding  . Penicillins Nausea And Vomiting and Other (See Comments)    Dizzy Has patient  had a PCN reaction causing immediate rash, facial/tongue/throat swelling, SOB or lightheadedness with hypotension: No Has patient had a PCN reaction causing severe rash involving mucus membranes or skin necrosis: No Has patient had a PCN reaction that required hospitalization; No Has patient had a PCN reaction occurring within the last 10 years: No If all of the above answers are "NO", then may proceed with Cephalosporin use.   . Latex Itching  . Streptomycin Nausea And Vomiting and Rash  . Tramadol Nausea Only    Social History   Socioeconomic History  . Marital status: Married    Spouse  name: Not on file  . Number of children: 6  . Years of education: 31   . Highest education level: Not on file  Social Needs  . Financial resource strain: Not on file  . Food insecurity - worry: Not on file  . Food insecurity - inability: Not on file  . Transportation needs - medical: Not on file  . Transportation needs - non-medical: Not on file  Occupational History  . Occupation: Unemployed   Tobacco Use  . Smoking status: Never Smoker  . Smokeless tobacco: Never Used  Substance and Sexual Activity  . Alcohol use: Yes    Comment: occasional wine  . Drug use: No  . Sexual activity: Yes    Birth control/protection: None  Other Topics Concern  . Not on file  Social History Narrative   From [place].   Lived in [place] since 1994.    Live with husband.   6 adult children.    Speaks some English and reads some  [place].    Caffeine use: Coffee daily   Right handed    Family History  Problem Relation Age of Onset  . Hypertension Mother   . Stomach cancer Father   . Liver disease Maternal Uncle   . Lung cancer Maternal Grandmother     Past Surgical History:  Procedure Laterality Date  . COLONOSCOPY WITH PROPOFOL N/A 01/03/2015   Procedure: COLONOSCOPY WITH PROPOFOL;  Surgeon: Jerene Bears, MD;  Location: [facility];  Service: Gastroenterology;  Laterality: N/A;  . CYSTOSCOPY W/ URETERAL STENT PLACEMENT Left 06/17/2016   Procedure: CYSTOSCOPY WITH RETROGRADE PYELOGRAM/URETERAL STENT PLACEMENT;  Surgeon: Raynelle Bring, MD;  Location: [facility];  Service: Urology;  Laterality: Left;  . ESOPHAGOGASTRODUODENOSCOPY (EGD) WITH PROPOFOL N/A 01/03/2015   Procedure: ESOPHAGOGASTRODUODENOSCOPY (EGD) WITH PROPOFOL;  Surgeon: Jerene Bears, MD;  Location: [facility];  Service: Gastroenterology;  Laterality: N/A;  . ESOPHAGOGASTRODUODENOSCOPY ENDOSCOPY     several times  . HOT HEMOSTASIS N/A 01/03/2015   Procedure: HOT HEMOSTASIS (ARGON PLASMA COAGULATION/BICAP);  Surgeon: Jerene Bears, MD;   Location: [facility];  Service: Gastroenterology;  Laterality: N/A;  . NO PAST SURGERIES    . ROBOT ASSISTED PYELOPLASTY Left 06/17/2016   Procedure: XI ROBOTIC ASSISTED PYELOPLASTY;  Surgeon: Raynelle Bring, MD;  Location: [facility];  Service: Urology;  Laterality: Left;    ROS: Review of Systems Negative except as stated above PHYSICAL EXAM: BP 122/75   Pulse 68   Temp 98.1 F (36.7 C) (Oral)   Resp 16   Wt 151 lb 12.8 oz (68.9 kg)   SpO2 98%   BMI 27.76 kg/m   Blood pressure sitting 138/72.  Blood pressure standing 124/78. Physical Exam General appearance - alert, well appearing, and in no distress Mental status - alert, oriented to person, place, and time, normal mood, behavior, speech, dress, motor activity, and thought processes Ears -  bilateral TM's and external ear canals normal Mouth - mucous membranes moist, pharynx normal without lesions Chest - clear to auscultation, no wheezes, rales or rhonchi, symmetric air entry Heart - normal rate, regular rhythm, normal S1, S2, no murmurs, rubs, clicks or gallops Abdomen - soft, nontender, nondistended, no masses or organomegaly Neurological - cranial nerves II through XII intact, motor and sensory grossly normal bilaterally, Romberg sign negative, normal gait and station Extremities -no lower extremity edema.  Lab Results  Component Value Date   WBC 8.0 07/15/2017   HGB 12.9 07/16/2017   HCT 38.0 07/16/2017   MCV 79.2 07/15/2017   PLT 181 07/15/2017     Chemistry      Component Value Date/Time   NA 140 07/16/2017 0513   NA 141 01/21/2017 1243   K 3.4 (L) 07/16/2017 0513   CL 102 07/16/2017 0513   CO2 24 07/15/2017 1805   BUN 13 07/16/2017 0513   BUN 12 01/21/2017 1243   CREATININE 0.50 07/16/2017 0513   CREATININE 0.79 07/02/2016 0942      Component Value Date/Time   CALCIUM 9.6 07/15/2017 1805   ALKPHOS 72 07/15/2017 1805   AST 28 07/15/2017 1805   ALT 22 07/15/2017 1805   BILITOT 0.7 07/15/2017 1805   BILITOT  0.4 01/21/2017 1243       ASSESSMENT AND PLAN: 1. Dizziness 2. Meniere disease, left -Encourage her to drink fluids during the day to keep herself hydrated. -Recommend use of meclizine as needed but not 3 times a day as prescribed from the ER. -Printed information given on Mnire's disease  3. HLD (hyperlipidemia) - atorvastatin (LIPITOR) 20 MG tablet; Take 1 tablet (20 mg total) by mouth daily.  Dispense: 30 tablet; Refill: 6  4. Hypokalemia -Looks like she has had several episodes in the past with potassium being very low.  Will have her stop HCTZ and potassium - Potassium - Magnesium  5. Essential hypertension Stop HCTZ and potassium.  Start Cozaar instead - losartan (COZAAR) 25 MG tablet; Take 1 tablet (25 mg total) by mouth daily.  Dispense: 30 tablet; Refill: 5  Patient was given the opportunity to ask questions.  Patient verbalized understanding of the plan and was able to repeat key elements of the plan.   Orders Placed This Encounter  Procedures  . Potassium  . Magnesium     Requested Prescriptions   Signed Prescriptions Disp Refills  . atorvastatin (LIPITOR) 20 MG tablet 30 tablet 6    Sig: Take 1 tablet (20 mg total) by mouth daily.  Marland Kitchen losartan (COZAAR) 25 MG tablet 30 tablet 5    Sig: Take 1 tablet (25 mg total) by mouth daily.    Return in about 3 weeks (around 08/15/2017) for dizziness.  Karle Plumber, MD, FACP

## 2017-07-26 LAB — MAGNESIUM: Magnesium: 2.5 mg/dL — ABNORMAL HIGH (ref 1.6–2.3)

## 2017-07-26 LAB — POTASSIUM: Potassium: 3.6 mmol/L (ref 3.5–5.2)

## 2017-08-20 MED FILL — ATORVASTATIN 20 MG TABLET: 20 | 30 days supply | Qty: 30 | Fill #1

## 2017-08-20 MED FILL — LOSARTAN POTASSIUM 25 MG TA: 25 | 30 days supply | Qty: 30 | Fill #1

## 2017-08-20 MED FILL — POLYETHYLENE GLYCOL 3350 PO: 28 days supply | Qty: 476 | Fill #9

## 2017-08-21 ENCOUNTER — Other Ambulatory Visit: Payer: Self-pay

## 2017-08-21 DIAGNOSIS — K589 Irritable bowel syndrome without diarrhea: Secondary | ICD-10-CM

## 2017-08-21 DIAGNOSIS — I10 Essential (primary) hypertension: Secondary | ICD-10-CM

## 2017-08-21 MED ORDER — OMEPRAZOLE 40 MG PO CPDR: 40.0000 mg | DELAYED_RELEASE_CAPSULE | Freq: Every day | ORAL | 3 refills | Status: DC

## 2017-08-21 MED ORDER — AMLODIPINE BESYLATE 10 MG PO TABS: 10.0000 mg | ORAL_TABLET | Freq: Every day | ORAL | 3 refills | Status: DC

## 2017-08-21 MED FILL — AMLODIPINE BESYLATE 10 MG T: 10 | 30 days supply | Qty: 30 | Fill #0

## 2017-08-21 MED FILL — OMEPRAZOLE DR 40 MG CAPSULE: 40 | 30 days supply | Qty: 30 | Fill #0

## 2017-08-26 ENCOUNTER — Ambulatory Visit: Payer: Self-pay | Attending: Internal Medicine | Admitting: Internal Medicine

## 2017-08-26 ENCOUNTER — Encounter: Payer: Self-pay | Admitting: Internal Medicine

## 2017-08-26 VITALS — BP 130/80 | HR 68 | Temp 98.2°F | Resp 16 | Wt 154.4 lb

## 2017-08-26 DIAGNOSIS — Z79899 Other long term (current) drug therapy: Secondary | ICD-10-CM | POA: Insufficient documentation

## 2017-08-26 DIAGNOSIS — Z9889 Other specified postprocedural states: Secondary | ICD-10-CM | POA: Insufficient documentation

## 2017-08-26 DIAGNOSIS — T753XXA Motion sickness, initial encounter: Secondary | ICD-10-CM | POA: Insufficient documentation

## 2017-08-26 DIAGNOSIS — Z888 Allergy status to other drugs, medicaments and biological substances status: Secondary | ICD-10-CM | POA: Insufficient documentation

## 2017-08-26 DIAGNOSIS — M722 Plantar fascial fibromatosis: Secondary | ICD-10-CM | POA: Insufficient documentation

## 2017-08-26 DIAGNOSIS — G43109 Migraine with aura, not intractable, without status migrainosus: Secondary | ICD-10-CM | POA: Insufficient documentation

## 2017-08-26 DIAGNOSIS — R498 Other voice and resonance disorders: Secondary | ICD-10-CM | POA: Insufficient documentation

## 2017-08-26 DIAGNOSIS — N1330 Unspecified hydronephrosis: Secondary | ICD-10-CM | POA: Insufficient documentation

## 2017-08-26 DIAGNOSIS — E785 Hyperlipidemia, unspecified: Secondary | ICD-10-CM | POA: Insufficient documentation

## 2017-08-26 DIAGNOSIS — K589 Irritable bowel syndrome without diarrhea: Secondary | ICD-10-CM | POA: Insufficient documentation

## 2017-08-26 DIAGNOSIS — R42 Dizziness and giddiness: Secondary | ICD-10-CM | POA: Insufficient documentation

## 2017-08-26 DIAGNOSIS — G4489 Other headache syndrome: Secondary | ICD-10-CM | POA: Insufficient documentation

## 2017-08-26 DIAGNOSIS — E876 Hypokalemia: Secondary | ICD-10-CM | POA: Insufficient documentation

## 2017-08-26 DIAGNOSIS — I10 Essential (primary) hypertension: Secondary | ICD-10-CM | POA: Insufficient documentation

## 2017-08-26 DIAGNOSIS — M19031 Primary osteoarthritis, right wrist: Secondary | ICD-10-CM | POA: Insufficient documentation

## 2017-08-26 DIAGNOSIS — H8109 Meniere's disease, unspecified ear: Secondary | ICD-10-CM | POA: Insufficient documentation

## 2017-08-26 DIAGNOSIS — B181 Chronic viral hepatitis B without delta-agent: Secondary | ICD-10-CM | POA: Insufficient documentation

## 2017-08-26 NOTE — Progress Notes (Signed)
Pt states her right arm and leg goes numb during the day and night Pt states the numbness comes and goes Pt states when she feels stress she feels it in her chest Pt states she still have feeling dizzy

## 2017-08-26 NOTE — Progress Notes (Signed)
Patient ID: Jacqueline Orozco, female    DOB: 22-Dec-1950  MRN: [id number]  CC: Follow-up and Headache   Subjective: Jacqueline Orozco is a 67 y.o. female who presents for f/u dizziness.  Last seen 07/25/2017 Her concerns today include:  Hx of Chronic LBPdue to disc ds, HTN, HL, L hydronephrosis s/p pyeloplasty 06/2016, chronic hepB Migraines  1. Dizziness:  Better.  But notice she gets dizzy and nausea when she is a passenger in the car. Dizziness if she turns her head to look out the window.  No dizziness if she stares straight ahead.  2.  Had LT side HA twice last wk.  Associated with numbness down LT LTt arm and leg.  Intermittent, lasting 5-10 mins. + dizziness but no N/V/blurred vision/photophobia.  Sometimes she takes Tylenol and Advil with relief after about 1-2 hrs later. Sometimes she does not have to take medications at all because the HA resolves with drinking ginger root tea.  Occurs 1-2 x a wk  3.  HTN: HCTZ stopped on last visit visit due to hypokalemia. Tolerating Losartan.  No device to check BP. -Mg level was elev. Sent pt Mychart message to stop Mg supplement  4.  C/o voice strain x 4-5 yrs.  When she talks or tries to sing, she feels limited in vocal range and strains to get it out.  No hoarseness.  She does not smoke.  Patient Active Problem List   Diagnosis Date Noted  . Meniere disease, left 07/25/2017  . 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 Anterolisthesis 12/24/2016  . Allergic contact dermatitis due to adhesives 07/02/2016  . Hydronephrosis with ureteropelvic junction (UPJ) obstruction 04/15/2016  . Osteoarthritis of right wrist 07/28/2015  . Gastric and duodenal angiodysplasia   . Seasonal allergies 09/15/2014  . IBS (irritable bowel syndrome) 06/20/2014  . Lumbar disc disease 09/29/2013  . Gastric AVM 12/29/2012  . Hx of adenomatous colonic polyps 10/16/2012  . Plantar fasciitis, right 05/19/2012  . Cervical radiculitis 02/12/2012  . Vertigo 05/22/2011  . HEPATITIS B,  CHRONIC 03/28/2010  . HLD (hyperlipidemia) 03/28/2010  . Essential hypertension 03/28/2010     Current Outpatient Medications on File Prior to Visit  Medication Sig Dispense Refill  . acetaminophen (TYLENOL 8 HOUR) 650 MG CR tablet Take 1 tablet (650 mg total) by mouth every 8 (eight) hours as needed for pain. 90 tablet 1  . amLODipine (NORVASC) 10 MG tablet Take 1 tablet (10 mg total) by mouth daily. 90 tablet 3  . atorvastatin (LIPITOR) 20 MG tablet Take 1 tablet (20 mg total) by mouth daily. 30 tablet 6  . Calcium Citrate 250 MG TABS Take 2 tablets (500 mg total) by mouth daily. 60 tablet 11  . calcium citrate-vitamin D 500-400 MG-UNIT chewable tablet Chew 1 tablet by mouth 2 (two) times daily. 180 tablet 1  . cetirizine (ZYRTEC) 10 MG tablet Take 1 tablet (10 mg total) by mouth daily. 30 tablet 11  . diclofenac sodium (VOLTAREN) 1 % GEL Apply 2 g topically 4 (four) times daily. 100 g 0  . dicyclomine (BENTYL) 20 MG tablet TAKE 1 TABLET BY MOUTH 3 TIMES DAILY BEFORE MEALS.  Needs to be seen by PCP prior to next RF request. 90 tablet 0  . losartan (COZAAR) 25 MG tablet Take 1 tablet (25 mg total) by mouth daily. 30 tablet 5  . meclizine (ANTIVERT) 25 MG tablet Take 1 tablet (25 mg total) by mouth 3 (three) times daily as needed for dizziness or nausea. (Patient  not taking: Reported on 07/25/2017) 30 tablet 0  . omeprazole (PRILOSEC) 40 MG capsule Take 1 capsule (40 mg total) by mouth daily. 90 capsule 3   No current facility-administered medications on file prior to visit.     Allergies  Allergen Reactions  . Aspirin Other (See Comments)    stomach pain, stomach bleeding  . Penicillins Nausea And Vomiting and Other (See Comments)    Dizzy Has patient had a PCN reaction causing immediate rash, facial/tongue/throat swelling, SOB or lightheadedness with hypotension: No Has patient had a PCN reaction causing severe rash involving mucus membranes or skin necrosis: No Has patient had a PCN  reaction that required hospitalization; No Has patient had a PCN reaction occurring within the last 10 years: No If all of the above answers are "NO", then may proceed with Cephalosporin use.   . Latex Itching  . Streptomycin Nausea And Vomiting and Rash  . Tramadol Nausea Only    Social History   Socioeconomic History  . Marital status: Married    Spouse name: Not on file  . Number of children: 6  . Years of education: 34   . Highest education level: Not on file  Social Needs  . Financial resource strain: Not on file  . Food insecurity - worry: Not on file  . Food insecurity - inability: Not on file  . Transportation needs - medical: Not on file  . Transportation needs - non-medical: Not on file  Occupational History  . Occupation: Unemployed   Tobacco Use  . Smoking status: Never Smoker  . Smokeless tobacco: Never Used  Substance and Sexual Activity  . Alcohol use: Yes    Comment: occasional wine  . Drug use: No  . Sexual activity: Yes    Birth control/protection: None  Other Topics Concern  . Not on file  Social History Narrative   From [place].   Lived in [place] since 1994.    Live with husband.   6 adult children.    Speaks some English and reads some  [place].    Caffeine use: Coffee daily   Right handed    Family History  Problem Relation Age of Onset  . Hypertension Mother   . Stomach cancer Father   . Liver disease Maternal Uncle   . Lung cancer Maternal Grandmother     Past Surgical History:  Procedure Laterality Date  . COLONOSCOPY WITH PROPOFOL N/A 01/03/2015   Procedure: COLONOSCOPY WITH PROPOFOL;  Surgeon: Jerene Bears, MD;  Location: [facility];  Service: Gastroenterology;  Laterality: N/A;  . CYSTOSCOPY W/ URETERAL STENT PLACEMENT Left 06/17/2016   Procedure: CYSTOSCOPY WITH RETROGRADE PYELOGRAM/URETERAL STENT PLACEMENT;  Surgeon: Raynelle Bring, MD;  Location: [facility];  Service: Urology;  Laterality: Left;  . ESOPHAGOGASTRODUODENOSCOPY (EGD) WITH  PROPOFOL N/A 01/03/2015   Procedure: ESOPHAGOGASTRODUODENOSCOPY (EGD) WITH PROPOFOL;  Surgeon: Jerene Bears, MD;  Location: [facility];  Service: Gastroenterology;  Laterality: N/A;  . ESOPHAGOGASTRODUODENOSCOPY ENDOSCOPY     several times  . HOT HEMOSTASIS N/A 01/03/2015   Procedure: HOT HEMOSTASIS (ARGON PLASMA COAGULATION/BICAP);  Surgeon: Jerene Bears, MD;  Location: [facility];  Service: Gastroenterology;  Laterality: N/A;  . NO PAST SURGERIES    . ROBOT ASSISTED PYELOPLASTY Left 06/17/2016   Procedure: XI ROBOTIC ASSISTED PYELOPLASTY;  Surgeon: Raynelle Bring, MD;  Location: [facility];  Service: Urology;  Laterality: Left;    ROS: Review of Systems Neg except as above PHYSICAL EXAM: BP (!) 145/82   Pulse 68  Temp 98.2 F (36.8 C) (Oral)   Resp 16   Wt 154 lb 6.4 oz (70 kg)   SpO2 96%   BMI 28.24 kg/m   Repeat BP 130/80  Physical Exam  General appearance - alert, well appearing, and in no distress Mental status - alert, oriented to person, place, and time, normal mood, behavior, speech, dress, motor activity, and thought processes Mouth - mucous membranes moist, pharynx normal without lesions Neck - supple, no significant adenopathy.  No thyroid enlaregment or nodules palpated Chest - clear to auscultation, no wheezes, rales or rhonchi, symmetric air entry Heart - normal rate, regular rhythm, normal S1, S2, no murmurs, rubs, clicks or gallops Neurological - CNs grossly intact. Gross sensation intact.  Grip and power 5/5 BL UEs and LEs.  No carotid bruits  ASSESSMENT AND PLAN: 1. Motion sickness, initial encounter Advised to use meclizine as needed when she is a passenger in a vehicle  2. Other headache syndrome -Does not sound like migraine type headache given that it lasts only 5-10 minutes, has no associated nausea/vomiting or photophobia.  However, the associated numbness on LT side is concerning.  Will refer to neurology. Continue Tylenol, Advil as needed -  Ambulatory referral to Neurology  3. Essential hypertension At goal Continue Norvasc and Cozaar  4. Voice strain - Ambulatory referral to ENT   Patient was given the opportunity to ask questions.  Patient verbalized understanding of the plan and was able to repeat key elements of the plan.   Orders Placed This Encounter  Procedures  . Ambulatory referral to Neurology  . Ambulatory referral to ENT     Requested Prescriptions    No prescriptions requested or ordered in this encounter    Return in about 4 months (around 12/26/2017).  Karle Plumber, MD, FACP

## 2017-08-26 NOTE — Patient Instructions (Signed)
Use the Meclizine whenever you are going to be a passenger in a car.   Your blood pressure is well controlled.  Continue Amlodipine and Losartan.   You have been referred to a neurologist and an ENT specialist.

## 2017-09-15 MED FILL — AMLODIPINE BESYLATE 10 MG T: 10 | 30 days supply | Qty: 30 | Fill #1

## 2017-09-15 MED FILL — ?ATORVASTATIN 20 MG TABLET: 20 | 30 days supply | Qty: 30 | Fill #2

## 2017-09-15 MED FILL — OMEPRAZOLE DR 40 MG CAPSULE: 40 | 30 days supply | Qty: 30 | Fill #1

## 2017-09-15 MED FILL — LOSARTAN POTASSIUM 25 MG TA: 25 | 30 days supply | Qty: 30 | Fill #2

## 2017-09-15 MED FILL — POLYETHYLENE GLYCOL 3350 PO: 28 days supply | Qty: 476 | Fill #10

## 2017-10-02 ENCOUNTER — Ambulatory Visit: Payer: Self-pay | Attending: Internal Medicine | Admitting: Physician Assistant

## 2017-10-02 ENCOUNTER — Ambulatory Visit (HOSPITAL_COMMUNITY)
Admission: RE | Admit: 2017-10-02 | Discharge: 2017-10-02 | Disposition: A | Discharge status: Home / Self Care | Payer: Self-pay | Source: Ambulatory Visit | Attending: Physician Assistant | Admitting: Physician Assistant

## 2017-10-02 VITALS — BP 125/82 | HR 73 | Temp 98.2°F | Resp 16 | Ht 63.0 in | Wt 152.0 lb

## 2017-10-02 DIAGNOSIS — Z79899 Other long term (current) drug therapy: Secondary | ICD-10-CM | POA: Insufficient documentation

## 2017-10-02 DIAGNOSIS — I10 Essential (primary) hypertension: Secondary | ICD-10-CM | POA: Insufficient documentation

## 2017-10-02 DIAGNOSIS — Z789 Other specified health status: Secondary | ICD-10-CM

## 2017-10-02 DIAGNOSIS — J40 Bronchitis, not specified as acute or chronic: Secondary | ICD-10-CM | POA: Insufficient documentation

## 2017-10-02 MED ORDER — AZITHROMYCIN 250 MG PO TABS: ORAL_TABLET | ORAL | 0 refills | Status: DC

## 2017-10-02 MED ORDER — PREDNISONE 10 MG PO TABS: ORAL_TABLET | ORAL | 0 refills | Status: DC

## 2017-10-02 MED FILL — predniSONE 10 MG TABS: 10 | 6 days supply | Qty: 21 | Fill #0

## 2017-10-02 MED FILL — AZITHROMYCIN 250 MG TABLET: 250 | 4 days supply | Qty: 6 | Fill #0

## 2017-10-02 NOTE — Progress Notes (Signed)
Patient ID: Jacqueline Orozco, female   DOB: 31-Jul-1950, 67 y.o.   MRN: [id number]       Jacqueline Orozco, is a 67 y.o. female  NIO:[id number]  GHW:[id number]  DOB - 04-19-1951  Subjective:  Chief Complaint and HPI: Jacqueline Orozco is a 67 y.o. female here today Cough and cold symptoms for 2 weeks.  No fever.  She has felt hot and cold.  Cough is productive of green phlegm and has been blood tinged a couple of times.  No travel.  No night sweats.  Taking OTC with some relief.     [employer] interpreters "Darrick Meigs" translating.    ROS:   Constitutional:  No f/c, No night sweats, No unexplained weight loss. EENT:  No vision changes, No blurry vision, No hearing changes+sneezing and congestion Respiratory: + cough, No SOB Cardiac: No CP, no palpitations GI:  No abd pain, No N/V/D. GU: No Urinary s/sx Musculoskeletal: No joint pain Neuro: No headache, no dizziness, no motor weakness.  Skin: No rash Endocrine:  No polydipsia. No polyuria.  Psych: Denies SI/HI  No problems updated.  ALLERGIES: Allergies  Allergen Reactions  . Aspirin Other (See Comments)    stomach pain, stomach bleeding  . Penicillins Nausea And Vomiting and Other (See Comments)    Dizzy Has patient had a PCN reaction causing immediate rash, facial/tongue/throat swelling, SOB or lightheadedness with hypotension: No Has patient had a PCN reaction causing severe rash involving mucus membranes or skin necrosis: No Has patient had a PCN reaction that required hospitalization; No Has patient had a PCN reaction occurring within the last 10 years: No If all of the above answers are "NO", then may proceed with Cephalosporin use.   . Latex Itching  . Streptomycin Nausea And Vomiting and Rash  . Tramadol Nausea Only    PAST MEDICAL HISTORY: Past Medical History:  Diagnosis Date  . Adenomatous colon polyp   . Allergy   . Anemia   . 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 Blood transfusion without reported diagnosis   . Cold sore   . Fatty  liver   . Gastric AVM   . GERD (gastroesophageal reflux disease)   . HEPATITIS B, CHRONIC 03/28/2010  . HYPERLIPIDEMIA 03/28/2010  . HYPERTENSION 03/28/2010  . IBS (irritable bowel syndrome)   . Internal hemorrhoids   . Lumbar disc disease 09/29/2013  . PONV (postoperative nausea and vomiting)    headache also    MEDICATIONS AT HOME: Prior to Admission medications   Medication Sig Start Date End Date Taking? Authorizing Provider  acetaminophen (TYLENOL 8 HOUR) 650 MG CR tablet Take 1 tablet (650 mg total) by mouth every 8 (eight) hours as needed for pain. 07/28/15  Yes Funches, Josalyn, MD  amLODipine (NORVASC) 10 MG tablet Take 1 tablet (10 mg total) by mouth daily. 08/21/17  Yes Ladell Pier, MD  atorvastatin (LIPITOR) 20 MG tablet Take 1 tablet (20 mg total) by mouth daily. 07/25/17  Yes Ladell Pier, MD  Calcium Citrate 250 MG TABS Take 2 tablets (500 mg total) by mouth daily. 10/31/16  Yes Funches, Adriana Mccallum, MD  calcium citrate-vitamin D 500-400 MG-UNIT chewable tablet Chew 1 tablet by mouth 2 (two) times daily. 10/22/16  Yes Funches, Josalyn, MD  cetirizine (ZYRTEC) 10 MG tablet Take 1 tablet (10 mg total) by mouth daily. 10/22/16  Yes Funches, Josalyn, MD  diclofenac sodium (VOLTAREN) 1 % GEL Apply 2 g topically 4 (four) times daily. 12/23/16  Yes Ladell Pier, MD  dicyclomine (BENTYL)  20 MG tablet TAKE 1 TABLET BY MOUTH 3 TIMES DAILY BEFORE MEALS.  Needs to be seen by PCP prior to next RF request. 07/22/17  Yes Ladell Pier, MD  losartan (COZAAR) 25 MG tablet Take 1 tablet (25 mg total) by mouth daily. 07/25/17  Yes Ladell Pier, MD  omeprazole (PRILOSEC) 40 MG capsule Take 1 capsule (40 mg total) by mouth daily. 08/21/17  Yes Ladell Pier, MD  azithromycin [facility]) 250 MG tablet Take 2 today then 1 daily 10/02/17   Argentina Donovan, PA-C  meclizine (ANTIVERT) 25 MG tablet Take 1 tablet (25 mg total) by mouth 3 (three) times daily as needed for dizziness  or nausea. Patient not taking: Reported on 07/25/2017 01/20/17   Julianne Rice, MD  predniSONE (DELTASONE) 10 MG tablet 6,5,4,3,2,1 take each days dose all at once in the morning 10/02/17   Argentina Donovan, PA-C     Objective:  EXAM:   Vitals:   10/02/17 1102  BP: 125/82  Pulse: 73  Resp: 16  Temp: 98.2 F (36.8 C)  TempSrc: Oral  SpO2: 98%  Weight: 152 lb (68.9 kg)  Height: 5\' 3"  (1.6 m)    General appearance : A&OX3. NAD. Non-toxic-appearing HEENT: Atraumatic and Normocephalic.  PERRLA. EOM intact.  TM clear B. Mouth-MMM, post pharynx WNL w/o erythema, No PND. Neck: supple, no JVD. No cervical lymphadenopathy. No thyromegaly Chest/Lungs:  Breathing-non-labored, Good air entry bilaterally, breath sounds with rales R base.  No wheezing, no rhonchi.  Cough is harsh.   CVS: S1 S2 regular, no murmurs, gallops, rubs  Extremities: Bilateral Lower Ext shows no edema, both legs are warm to touch with = pulse throughout Neurology:  CN II-XII grossly intact, Non focal.   Psych:  TP linear. J/I WNL. Normal speech. Appropriate eye contact and affect.  Skin:  No Rash  Data Review Lab Results  Component Value Date   HGBA1C 5.80 07/28/2015     Assessment & Plan   1. Bronchitis - DG Chest 2 View; Future - predniSONE (DELTASONE) 10 MG tablet; 6,5,4,3,2,1 take each days dose all at once in the morning  Dispense: 21 tablet; Refill: 0(blood sugar is normal) - azithromycin (ZITHROMAX) 250 MG tablet; Take 2 today then 1 daily  Dispense: 6 tablet; Refill: 0 - CBC with Differential/Platelet  2. Essential hypertension Controlled-continue current regimen  3. Language barrier [employer] interpreters used and additional time performing visit was required.      Patient have been counseled extensively about nutrition and exercise  Return for keep 7/22 appt with Dr Wynetta Emery.  The patient was given clear instructions to go to ER or return to medical center if symptoms don't improve,  worsen or new problems develop. The patient verbalized understanding. The patient was told to call to get lab results if they haven't heard anything in the next week.     Freeman Caldron, PA-C [facility] [place], [place]   10/02/2017, 11:18 AM

## 2017-10-02 NOTE — Progress Notes (Signed)
Pt. Stated she feels like she have a cold with chills, nasal drainage, sore throat, and cough.  Pt. Stated she's been coughing so much and pt. Stated she saw blood when she cough. Pt. Stated she also have chest pain that radiates to her back and think she may have pneumonia.

## 2017-10-03 ENCOUNTER — Telehealth: Payer: Self-pay

## 2017-10-03 LAB — CBC WITH DIFFERENTIAL/PLATELET
BASOS ABS: 0 10*3/uL (ref 0.0–0.2)
Basos: 1 %
EOS (ABSOLUTE): 0.2 10*3/uL (ref 0.0–0.4)
Eos: 4 %
HEMATOCRIT: 40.9 % (ref 34.0–46.6)
Hemoglobin: 13.1 g/dL (ref 11.1–15.9)
IMMATURE GRANULOCYTES: 0 %
Immature Grans (Abs): 0 10*3/uL (ref 0.0–0.1)
LYMPHS ABS: 2.1 10*3/uL (ref 0.7–3.1)
Lymphs: 50 %
MCH: 26.3 pg — ABNORMAL LOW (ref 26.6–33.0)
MCHC: 32 g/dL (ref 31.5–35.7)
MCV: 82 fL (ref 79–97)
MONOS ABS: 0.5 10*3/uL (ref 0.1–0.9)
Monocytes: 13 %
Neutrophils Absolute: 1.3 10*3/uL — ABNORMAL LOW (ref 1.4–7.0)
Neutrophils: 32 %
PLATELETS: 169 10*3/uL (ref 150–379)
RBC: 4.99 x10E6/uL (ref 3.77–5.28)
RDW: 14.9 % (ref 12.3–15.4)
WBC: 4.1 10*3/uL (ref 3.4–10.8)

## 2017-10-03 NOTE — Telephone Encounter (Deleted)
-----   Message from Argentina Donovan, [place] sent at 10/03/2017 11:38 AM EDT ----- Your blood work and xray look good.  Take the medications I prescribed and you should start to improve.  Go to an Urgent Care or to the Emergency department if you worsen.  Otherwise, follow-up as planned.  Thanks, Freeman Caldron, PA-C

## 2017-10-03 NOTE — Telephone Encounter (Deleted)
CMA attempt to call patient. No answer and left a VM for patient to call back. If patient call back, please inform:  Your blood work and xray look good.  Take the medications I prescribed and you should start to improve.  Go to an Urgent Care or to the Emergency department if you worsen.  Otherwise, follow-up as planned.  Thanks, Freeman Caldron, PA-C

## 2017-10-03 NOTE — Telephone Encounter (Signed)
-----   Message from Argentina Donovan, [place] sent at 10/03/2017 11:38 AM EDT ----- Your blood work and xray look good.  Take the medications I prescribed and you should start to improve.  Go to an Urgent Care or to the Emergency department if you worsen.  Otherwise, follow-up as planned.  Thanks, Freeman Caldron, PA-C

## 2017-10-03 NOTE — Telephone Encounter (Signed)
CMA attempted to call patient to inform on lab results and advising.  No answer and left a VM for patient to call back.  If patient call back, please inform:  Your blood work and xray look good.  Take the medications I prescribed and you should start to improve.  Go to an Urgent Care or to the Emergency department if you worsen.  Otherwise, follow-up as planned.  Thanks, Freeman Caldron, PA-C

## 2017-10-08 ENCOUNTER — Ambulatory Visit: Payer: Self-pay | Attending: Internal Medicine | Admitting: Physician Assistant

## 2017-10-08 VITALS — BP 115/71 | HR 76 | Temp 97.9°F | Resp 16 | Wt 155.4 lb

## 2017-10-08 DIAGNOSIS — Z880 Allergy status to penicillin: Secondary | ICD-10-CM | POA: Insufficient documentation

## 2017-10-08 DIAGNOSIS — Z79899 Other long term (current) drug therapy: Secondary | ICD-10-CM | POA: Insufficient documentation

## 2017-10-08 DIAGNOSIS — R05 Cough: Secondary | ICD-10-CM | POA: Insufficient documentation

## 2017-10-08 DIAGNOSIS — J111 Influenza due to unidentified influenza virus with other respiratory manifestations: Secondary | ICD-10-CM | POA: Insufficient documentation

## 2017-10-08 DIAGNOSIS — Z888 Allergy status to other drugs, medicaments and biological substances status: Secondary | ICD-10-CM | POA: Insufficient documentation

## 2017-10-08 DIAGNOSIS — Z886 Allergy status to analgesic agent status: Secondary | ICD-10-CM | POA: Insufficient documentation

## 2017-10-08 DIAGNOSIS — H9201 Otalgia, right ear: Secondary | ICD-10-CM | POA: Insufficient documentation

## 2017-10-08 MED ORDER — BENZONATATE 100 MG PO CAPS: 200.0000 mg | ORAL_CAPSULE | Freq: Three times a day (TID) | ORAL | 0 refills | Status: DC | PRN

## 2017-10-08 MED ORDER — OSELTAMIVIR PHOSPHATE 75 MG PO CAPS: 75.0000 mg | ORAL_CAPSULE | Freq: Two times a day (BID) | ORAL | 0 refills | Status: DC

## 2017-10-08 MED FILL — BENZONATATE 100 MG CAP: 100 | 6 days supply | Qty: 40 | Fill #0

## 2017-10-08 MED FILL — ?OSELTAMIVIR PHOS 75 MG CAP: 75 | 5 days supply | Qty: 10 | Fill #0

## 2017-10-08 NOTE — Patient Instructions (Signed)
Bê?nh cu?m, Ng???i l??n  (Influenza, Adult)  Bê?nh cu?m, th???ng hay ????c go?i la? "cu?m" la? mô?t bê?nh nhiê?m vi ru?t chu? yê?u a?nh h???ng ?ê?n ????ng hô hâ?p. ????ng hô hâ?p bao gô?m ca?c c? quan giu?p quy? vi? th??, ch??ng ha?n nh? phô?i, mu?i va? ho?ng. Cu?m gây ra nhiê?u triê?u ch??ng ca?m la?nh phô? biê?n, cu?ng nh? sô?t cao va? ?au ng???i.  Cu?m d? dàng lây t? ng??i sang ng??i (d? lây). Tiêm pho?ng cu?m (tiêm v??c xin cu?m) mô?i n?m la? ca?ch tô?t nhâ?t ?ê? pho?ng tra?nh cu?m.  NGUYÊN NHÂN  Bê?nh cu?m do vi rút gây ra. Quy? vi? co? thê? nhiê?m vi ru?t b??ng ca?ch:  · Hít ph?i b?t b?n ra khi ng??i b? nhi?m b?nh ho ho?c h?t h?i.  · Ch?m vào nh?ng v?t ?ã b? nhi?m vi rút g?n ?ây, sau ?ó ch?m vào mi?ng, m?i ho?c m?t mình.  CÁC Y?U T? NGUY C?  Nh?ng y?u t? sau có th? làm quý v? d? b? cu?m h?n:  · Không r??a tay th???ng xuyên b??ng xa? pho?ng va? n???c ho??c thuô?c sa?t tru?ng tay co? cô?n.  · Tiê?p xu?c gâ?n gu?i v??i nhiê?u ng???i trong mu?a la?nh va? ca?m cu?m.  · Cha?m va?o miê?ng, m??t ho??c mu?i ma? không r??a ho??c sa?t tru?ng tay tr???c.  · Không uô?ng ?u? n???c ho??c không ?n mô?t chê? ?ô? ?n co? l??i cho s??c kho?e.  · Không ngu? ho??c tâ?p thê? du?c ?u?.  · Bi? c?ng th??ng râ?t nhiê?u.  · Không tiêm pho?ng cu?m mô?i n?m (ha?ng n?m).  Quy? vi? co? thê? co? nguy c? b? biê?n ch??ng cu?m cao h?n, ch??ng ha?n nh? nhi?m trùng phô?i n??ng (viêm phô?i), nê?u quy? vi?:  · Chelbi?i trên 67.  · Co? thai.  · Co? hê? thô?ng pho?ng chô?ng bê?nh tâ?t (hê? miê?n di?ch) yê?u. Quy? vi? có h? th?ng mi?n d?ch b? suy y?u nê?u quy? vi?:  ? B? nhiê?m HIV ho?c AIDS.  ? ?ang ????c ho?a tri?.  ? ?ang du?ng ca?c loa?i thuô?c ma? la?m gia?m hoa?t ?ô?ng (la?m suy yê?u) hê? miê?n di?ch.  · Co? bê?nh kéo da?i (ma?n ti?nh), ch??ng ha?n bê?nh tim, bê?nh thâ?n, tiê?u ????ng ho??c bê?nh phô?i.  · Bi? m?t b?nh lý ? gan.  · Béo phì.  · Bi? thiê?u ma?u.  TRI?U CH?NG  Ca?c triê?u ch??ng cu?a tình tr?ng na?y th???ng ke?o da?i 4-10 nga?y va? co?  thê? bao gô?m:  · S?t.  · ?n l?nh.  · ?au ??u, ?au nh?c c? th? ho??c ?au nh?c c? b?p.  · ?au h?ng.  · Ho.  · Ch?y n??c m?i ho?c ng?t m?i.  · C?m giác khó ch?u ? ng?c và ho.  · ?n không ngon.  · Yê?u ho??c m?t m?i (m?t).  · Chóng m?t.  · Bu?n nôn ho?c nôn.  CH?N ?OÁN  Tình tr?ng này có th? ???c ch?n ?oán d?a vào khai thác b?nh s? cu?a quy? vi? và khám th?c th?. Chuyên gia ch?m so?c s??c kho?e cu?a quy? vi? co? thê? la?m xe?t nghiê?m b?ng cách dùng t?m bông ngoáy m?i ho?c ngoáy h?ng ?ê? xa?c ??nh ch?n ?oán.  ?I?U TR?  Nê?u cúm ???c pha?t hi?n s??m, quy? vi? co? thê? ???c ?iê?u tri? b??ng thuô?c kha?ng vi ru?t, thu?c co? thê? la?m gia?m th??i gian bi? bê?nh va? gia?m m??c ?? n?ng cu?a ca?c triê?u ch??ng. Thuô?c na?y co? thê? ???c cho du?ng theo ???ng miê?ng (????ng uô?ng) ho??c qua mô?t ô?ng theo ???ng t?nh m?ch (IV) ??t va?o   mô?t trong ca?c ti?nh ma?ch cu?a quy? vi?.  Mu?c tiêu cu?a ?iê?u tri? la? la?m gia?m tri?u ch??ng b??ng ca?ch quý v? t?? ch?m so?c t?i nha?. Viê?c na?y co? thê? bao gô?m du?ng ca?c loa?i thuô?c không câ?n kê ??n, uô?ng nhiê?u n??c va? t?ng ?ô? â?m cho không khi? trong nha? quy? vi?.  Trong mô?t sô? tr??ng h?p, b?nh cúm s? t? kh?i. Bê?nh cu?m n??ng ho??c các biê?n ch??ng c?a bê?nh cu?m co? thê? câ?n ????c ?iê?u tri? trong bê?nh viê?n.  H??NG D?N CH?M SÓC T?I NHÀ  · Ch? s? d?ng thu?c không c?n kê ??n và thu?c c?n kê ??n theo ch? d?n c?a chuyên gia ch?m sóc s?c kh?e.  · S?? du?ng mô?t ma?y ta?o s??ng mu? ma?t ?? làm t?ng ?ô? ?m cho không khi? trong nha? quy? vi?. Vi?c na?y co? thê? la?m quy? vi? th?? dê? h?n.  · Ngh? ng?i khi c?n.  · U?ng ?? n??c ?? gi? cho n??c ti?u trong ho?c có màu vàng nh?t.  · Che mi?ng và m?i khi quý v? ho ho?c h?t h?i.  · Th??ng xuyên r??a tay quy? vi? b??ng xa? pho?ng va? n???c, nh?t là sau khi quy? vi? ho ho??c h??t h?i. N?u không có xà phòng và n??c, hãy dùng thu?c sát trùng tay.  · Nghi? la?m ho??c nghi? ho?c theo chi? dâ?n cu?a chuyên gia ch?m so?c s??c kho?e. Tr?? khi  quy? vi? ?ang ?ê?n g?p chuyên gia ch?m so?c s??c kho?e, ha?y cô? g??ng không r??i nha? cho ?ê?n khi hê?t sô?t trong 24 gi?? sau khi không du?ng thuô?c.  · Livân th? t?t c? các cu?c h?n khám l?i theo ch? d?n c?a chuyên gia ch?m sóc s?c kh?e. ?i?u này có vai trò quan tr?ng.    PHÒNG NG?A  · Tiêm pho?ng cu?m ha?ng n?m la? ca?ch tô?t nhâ?t ?ê? pho?ng tra?nh cu?m. Quy? vi? co? thê? ????c tiêm pho?ng cu?m va?o cuô?i mu?a he?, mu?a thu ho??c mu?a ?ông. Hãy h?i chuyên gia ch?m sóc s?c kh?e ?? bi?t khi nào quý v? c?n tiêm pho?ng cu?m.  · R??a tay th???ng xuyên ho??c s?? du?ng thuô?c sa?t tru?ng tay th???ng xuyên.  · Tra?nh tiê?p xu?c v??i nh??ng ng???i ?ang bi? ô?m trong mu?a la?nh va? mùa cu?m.  · ?n mô?t chê? ?ô? ?n co? l??i cho s??c kho?e, uô?ng nhiê?u n???c, ngu? ?u? va? tâ?p thê? du?c th???ng xuyên.    ?I KHÁM N?U:  · Quý v? có các tri?u ch?ng m?i.  · Quý v? bi?:  ? ?au ng?c.  ? Tiêu ch?y.  ? S?t.  · Quý v? ho tr?m tr?ng h?n.  · Quy? vi? ho ra nhiê?u di?ch nhâ?y h?n.  · Quý v? c?m th?y bu?n nôn ho?c quý v? nôn.    NGAY L?P T?C ?I KHÁM N?U:  · Quy? vi? bi? th? d?c ho?c khó th?.  · Da ho??c các móng c?a quy? vi? chuy?n sang ma?u h?i xanh.  · Quý v? b? ?au c? ho?c c?ng c? d? d?i.  · Quý v? ??t nhiên b? ?au ??u, ho?c ?ô?t nhiên bi? ?au ? m?t ho?c ? tai.  · Quy? vi? không th? ng?ng nôn m?a.    Thông tin này không nh?m m?c ?ích thay th? cho l?i khuyên mà chuyên gia ch?m sóc s?c kh?e nói v?i quý v?. Hãy b?o ??m quý v? ph?i th?o lu?n b?t k? v?n ?? gì mà quý v? có v?i chuyên gia ch?m sóc s?c kh?e c?a quý v?.  Document Released: 05/27/2005

## 2017-10-08 NOTE — Progress Notes (Signed)
Patient ID: Jacqueline Orozco, female   DOB: 1951-04-13, 67 y.o.   MRN: [id number]   Family member translating.      Jacqueline Orozco, is a 67 y.o. female  [id number]:[id number]  [id number]  DOB - 10-10-1950  Subjective:  Chief Complaint and HPI: Jacqueline Orozco is a 67 y.o. female here today and was starting to feel better after taking zpack until last night.  She acutely started having fever, chills, and body aches.  She also has a cough again, congestion, and ST.  Eating and drinking ok.  No N/V/D.  OTCs not helping.    Family member is translating.  She has also been suffering with R ear pain "for a while." ROS:   Constitutional:  No f/c, No night sweats, No unexplained weight loss. EENT:  No vision changes, No blurry vision, No hearing changes. No additional mouth, throat, or ear problems other than as listed above.  Respiratory: + cough, No SOB Cardiac: No CP, no palpitations GI:  No abd pain, No N/V/D. GU: No Urinary s/sx Musculoskeletal: No joint pain Neuro: No headache, no dizziness, no motor weakness.  Skin: No rash Endocrine:  No polydipsia. No polyuria.  Psych: Denies SI/HI  No problems updated.  ALLERGIES: Allergies  Allergen Reactions  . Aspirin Other (See Comments)    stomach pain, stomach bleeding  . Penicillins Nausea And Vomiting and Other (See Comments)    Dizzy Has patient had a PCN reaction causing immediate rash, facial/tongue/throat swelling, SOB or lightheadedness with hypotension: No Has patient had a PCN reaction causing severe rash involving mucus membranes or skin necrosis: No Has patient had a PCN reaction that required hospitalization; No Has patient had a PCN reaction occurring within the last 10 years: No If all of the above answers are "NO", then may proceed with Cephalosporin use.   . Latex Itching  . Streptomycin Nausea And Vomiting and Rash  . Tramadol Nausea Only    PAST MEDICAL HISTORY: Past Medical History:  Diagnosis Date  . Adenomatous colon polyp     . Allergy   . Anemia   . 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 Blood transfusion without reported diagnosis   . Cold sore   . Fatty liver   . Gastric AVM   . GERD (gastroesophageal reflux disease)   . HEPATITIS B, CHRONIC 03/28/2010  . HYPERLIPIDEMIA 03/28/2010  . HYPERTENSION 03/28/2010  . IBS (irritable bowel syndrome)   . Internal hemorrhoids   . Lumbar disc disease 09/29/2013  . PONV (postoperative nausea and vomiting)    headache also    MEDICATIONS AT HOME: Prior to Admission medications   Medication Sig Start Date End Date Taking? Authorizing Provider  acetaminophen (TYLENOL 8 HOUR) 650 MG CR tablet Take 1 tablet (650 mg total) by mouth every 8 (eight) hours as needed for pain. 07/28/15   Funches, Adriana Mccallum, MD  amLODipine (NORVASC) 10 MG tablet Take 1 tablet (10 mg total) by mouth daily. 08/21/17   Ladell Pier, MD  atorvastatin (LIPITOR) 20 MG tablet Take 1 tablet (20 mg total) by mouth daily. 07/25/17   Ladell Pier, MD  azithromycin (ZITHROMAX) 250 MG tablet Take 2 today then 1 daily Patient not taking: Reported on 10/08/2017 10/02/17   Argentina Donovan, PA-C  benzonatate (TESSALON) 100 MG capsule Take 2 capsules (200 mg total) by mouth 3 (three) times daily as needed for cough. 10/08/17   Argentina Donovan, PA-C  Calcium Citrate 250 MG TABS Take 2 tablets (500 mg total) by  mouth daily. 10/31/16   Boykin Nearing, MD  calcium citrate-vitamin D 500-400 MG-UNIT chewable tablet Chew 1 tablet by mouth 2 (two) times daily. 10/22/16   Funches, Adriana Mccallum, MD  cetirizine (ZYRTEC) 10 MG tablet Take 1 tablet (10 mg total) by mouth daily. 10/22/16   Funches, Adriana Mccallum, MD  diclofenac sodium (VOLTAREN) 1 % GEL Apply 2 g topically 4 (four) times daily. 12/23/16   Ladell Pier, MD  dicyclomine (BENTYL) 20 MG tablet TAKE 1 TABLET BY MOUTH 3 TIMES DAILY BEFORE MEALS.  Needs to be seen by PCP prior to next RF request. 07/22/17   Ladell Pier, MD  losartan (COZAAR) 25 MG tablet Take 1 tablet  (25 mg total) by mouth daily. 07/25/17   Ladell Pier, MD  meclizine (ANTIVERT) 25 MG tablet Take 1 tablet (25 mg total) by mouth 3 (three) times daily as needed for dizziness or nausea. Patient not taking: Reported on 07/25/2017 01/20/17   Julianne Rice, MD  omeprazole (PRILOSEC) 40 MG capsule Take 1 capsule (40 mg total) by mouth daily. 08/21/17   Ladell Pier, MD  oseltamivir (TAMIFLU) 75 MG capsule Take 1 capsule (75 mg total) by mouth 2 (two) times daily. 10/08/17   Argentina Donovan, PA-C  predniSONE (DELTASONE) 10 MG tablet 6,5,4,3,2,1 take each days dose all at once in the morning Patient not taking: Reported on 10/08/2017 10/02/17   Argentina Donovan, PA-C     Objective:  EXAM:   Vitals:   10/08/17 1004  BP: 115/71  Pulse: 76  Resp: 16  Temp: 97.9 F (36.6 C)  TempSrc: Oral  SpO2: 95%  Weight: 155 lb 6.4 oz (70.5 kg)    General appearance : A&OX3. NAD. Non-toxic-appearing HEENT: Atraumatic and Normocephalic.  PERRLA. EOM intact.  TM clear B.(No visibile abnormality of R ear appreciated.) Mouth-MMM, post pharynx WNL w/o erythema, No PND. Neck: supple, no JVD. No cervical lymphadenopathy. No thyromegaly Chest/Lungs:  Breathing-non-labored, Good air entry bilaterally, breath sounds normal without rales, rhonchi, or wheezing  CVS: S1 S2 regular, no murmurs, gallops, rubs  Extremities: Bilateral Lower Ext shows no edema, both legs are warm to touch with = pulse throughout Neurology:  CN II-XII grossly intact, Non focal.   Psych:  TP linear. J/I WNL. Normal speech. Appropriate eye contact and affect.  Skin:  No Rash  Data Review Lab Results  Component Value Date   HGBA1C 5.80 07/28/2015     Assessment & Plan   1. Influenza Hydration imperative.  Tylenol or advil for body aches.   - oseltamivir (TAMIFLU) 75 MG capsule; Take 1 capsule (75 mg total) by mouth 2 (two) times daily.  Dispense: 10 capsule; Refill: 0 - benzonatate (TESSALON) 100 MG capsule; Take 2  capsules (200 mg total) by mouth 3 (three) times daily as needed for cough.  Dispense: 40 capsule; Refill: 0  2. Right ear pain No abnormality appreciated.   - Ambulatory referral to ENT     Patient have been counseled extensively about nutrition and exercise  Return for keep 7/22 appt with Dr Wynetta Emery.  The patient was given clear instructions to go to ER or return to medical center if symptoms don't improve, worsen or new problems develop. The patient verbalized understanding. The patient was told to call to get lab results if they haven't heard anything in the next week.     Freeman Caldron, PA-C [facility] and [facility], [place]   10/08/2017, 10:14 AM

## 2017-10-16 MED FILL — AMLODIPINE BESYLATE 10 MG T: 10 | 30 days supply | Qty: 30 | Fill #2

## 2017-10-16 MED FILL — OMEPRAZOLE DR 40 MG CAPSULE: 40 | 30 days supply | Qty: 30 | Fill #2

## 2017-10-16 MED FILL — LOSARTAN POTASSIUM 25 MG TA: 25 | 30 days supply | Qty: 30 | Fill #3

## 2017-10-16 MED FILL — POLYETHYLENE GLYCOL 3350 PO: 28 days supply | Qty: 476 | Fill #11

## 2017-10-16 MED FILL — ?ATORVASTATIN 20 MG TABLET: 20 | 30 days supply | Qty: 30 | Fill #3

## 2017-11-19 ENCOUNTER — Ambulatory Visit: Payer: Self-pay | Attending: Family Medicine

## 2017-11-20 MED FILL — AMLODIPINE BESYLATE 10 MG T: 10 | 30 days supply | Qty: 30 | Fill #3

## 2017-11-20 MED FILL — LOSARTAN POTASSIUM 25 MG TA: 25 | 30 days supply | Qty: 30 | Fill #4

## 2017-11-20 MED FILL — OMEPRAZOLE DR 40 MG CAPSULE: 40 | 30 days supply | Qty: 30 | Fill #3

## 2017-11-20 MED FILL — ?ATORVASTATIN 20 MG TABLET: 20 | 30 days supply | Qty: 30 | Fill #4

## 2017-12-19 MED FILL — LOSARTAN POTASSIUM 25 MG TA: 25 | 30 days supply | Qty: 30 | Fill #5

## 2017-12-19 MED FILL — OMEPRAZOLE DR 40 MG CAPSULE: 40 | 30 days supply | Qty: 30 | Fill #4

## 2017-12-19 MED FILL — ?ATORVASTATIN 20 MG TABLET: 20 | 30 days supply | Qty: 30 | Fill #5

## 2017-12-19 MED FILL — AMLODIPINE BESYLATE 10 MG T: 10 | 30 days supply | Qty: 30 | Fill #4

## 2017-12-24 ENCOUNTER — Other Ambulatory Visit: Payer: Self-pay

## 2017-12-24 MED ORDER — POLYETHYLENE GLYCOL 3350 17 GM/SCOOP PO POWD: ORAL | 0 refills | Status: DC

## 2017-12-24 MED FILL — POLYETHYLENE GLYCOL 3350 PO: 14 days supply | Qty: 238 | Fill #0

## 2017-12-26 ENCOUNTER — Other Ambulatory Visit: Payer: Self-pay | Admitting: Internal Medicine

## 2017-12-26 ENCOUNTER — Other Ambulatory Visit: Payer: Self-pay

## 2017-12-26 ENCOUNTER — Ambulatory Visit: Payer: Self-pay | Attending: Internal Medicine | Admitting: Internal Medicine

## 2017-12-26 ENCOUNTER — Encounter: Payer: Self-pay | Admitting: Internal Medicine

## 2017-12-26 VITALS — BP 132/81 | HR 74 | Temp 98.1°F | Resp 16 | Wt 151.6 lb

## 2017-12-26 DIAGNOSIS — E785 Hyperlipidemia, unspecified: Secondary | ICD-10-CM | POA: Insufficient documentation

## 2017-12-26 DIAGNOSIS — M549 Dorsalgia, unspecified: Secondary | ICD-10-CM | POA: Insufficient documentation

## 2017-12-26 DIAGNOSIS — Z886 Allergy status to analgesic agent status: Secondary | ICD-10-CM | POA: Insufficient documentation

## 2017-12-26 DIAGNOSIS — Z880 Allergy status to penicillin: Secondary | ICD-10-CM | POA: Insufficient documentation

## 2017-12-26 DIAGNOSIS — Z8249 Family history of ischemic heart disease and other diseases of the circulatory system: Secondary | ICD-10-CM | POA: Insufficient documentation

## 2017-12-26 DIAGNOSIS — R0789 Other chest pain: Secondary | ICD-10-CM | POA: Insufficient documentation

## 2017-12-26 DIAGNOSIS — Z800 Family history of malignant neoplasm of digestive organs: Secondary | ICD-10-CM | POA: Insufficient documentation

## 2017-12-26 DIAGNOSIS — Z79899 Other long term (current) drug therapy: Secondary | ICD-10-CM | POA: Insufficient documentation

## 2017-12-26 DIAGNOSIS — G8929 Other chronic pain: Secondary | ICD-10-CM | POA: Insufficient documentation

## 2017-12-26 DIAGNOSIS — Z888 Allergy status to other drugs, medicaments and biological substances status: Secondary | ICD-10-CM | POA: Insufficient documentation

## 2017-12-26 DIAGNOSIS — Z8619 Personal history of other infectious and parasitic diseases: Secondary | ICD-10-CM | POA: Insufficient documentation

## 2017-12-26 DIAGNOSIS — I10 Essential (primary) hypertension: Secondary | ICD-10-CM | POA: Insufficient documentation

## 2017-12-26 LAB — POCT URINALYSIS DIPSTICK
BILIRUBIN UA: NEGATIVE
Blood, UA: NEGATIVE
GLUCOSE UA: NEGATIVE
Ketones, UA: NEGATIVE
Leukocytes, UA: NEGATIVE
Nitrite, UA: NEGATIVE
Protein, UA: NEGATIVE
Spec Grav, UA: 1.01 (ref 1.010–1.025)
Urobilinogen, UA: 0.2 E.U./dL
pH, UA: 6 (ref 5.0–8.0)

## 2017-12-26 MED ORDER — POLYETHYLENE GLYCOL 3350 17 GM/SCOOP PO POWD: ORAL | 4 refills | Status: DC

## 2017-12-26 NOTE — Telephone Encounter (Signed)
RX was sold for one bottle, pharmacy advised that pt can call when ready for refill and ask for it to be sent for 2 small or one large bottle

## 2017-12-26 NOTE — Progress Notes (Signed)
Patient ID: Jacqueline Orozco, female    DOB: [birth year]  MRN: [id number]  CC: Hypertension   Subjective: Jacqueline Orozco is a 67 y.o. female who presents for chronic ds management Her concerns today include:  Hx of Chronic LBPdue to disc ds, HTN, HL, L hydronephrosis s/p pyeloplasty 06/2016, chronic hepB, Migraines  C/o pain LT side since surgery last yr.  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  This required urethral stent placement.  Since then she reports that she always has chronic pain on her left side but felt it was a little worse a few weeks ago for which she took Advil and Tylenol.  It has gotten better.  She denies any dysuria or hematuria.  She had a CAT scan of the abdomen done in February of this year through the ER that did not reveal any recurrent hydronephrosis.   HTN:  Compliant with Cozaar and amlodipine.  She limits salt in the foods.  Complains of sharp intermittent left-sided chest pains for the past 5 months.  Pains are not associated with activity.  As a matter of fact they get better when she is doing housework or mowing her lawn.  Pain does not radiate.  There is no associated shortness of breath.  Patient does not smoke.  Denies any heartburn symptoms.  Pain gets better by rubbing on the chest for several seconds.  No association to foods.  HL:  Tolerating atorvastatin  Patient Active Problem List   Diagnosis Date Noted  . Motion sickness 08/26/2017  . Meniere disease, left 07/25/2017  . 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 Anterolisthesis 12/24/2016  . Allergic contact dermatitis due to adhesives 07/02/2016  . Hydronephrosis with ureteropelvic junction (UPJ) obstruction 04/15/2016  . Osteoarthritis of right wrist 07/28/2015  . Gastric and duodenal angiodysplasia   . Seasonal allergies 09/15/2014  . IBS (irritable bowel syndrome) 06/20/2014  . Lumbar disc disease 09/29/2013  . Gastric AVM 12/29/2012  . Hx of  adenomatous colonic polyps 10/16/2012  . Plantar fasciitis, right 05/19/2012  . Cervical radiculitis 02/12/2012  . Vertigo 05/22/2011  . HEPATITIS B, CHRONIC 03/28/2010  . HLD (hyperlipidemia) 03/28/2010  . Essential hypertension 03/28/2010     Current Outpatient Medications on File Prior to Visit  Medication Sig Dispense Refill  . acetaminophen (TYLENOL 8 HOUR) 650 MG CR tablet Take 1 tablet (650 mg total) by mouth every 8 (eight) hours as needed for pain. 90 tablet 1  . amLODipine (NORVASC) 10 MG tablet Take 1 tablet (10 mg total) by mouth daily. 90 tablet 3  . atorvastatin (LIPITOR) 20 MG tablet Take 1 tablet (20 mg total) by mouth daily. 30 tablet 6  . benzonatate (TESSALON) 100 MG capsule Take 2 capsules (200 mg total) by mouth 3 (three) times daily as needed for cough. 40 capsule 0  . Calcium Citrate 250 MG TABS Take 2 tablets (500 mg total) by mouth daily. 60 tablet 11  . calcium citrate-vitamin D 500-400 MG-UNIT chewable tablet Chew 1 tablet by mouth 2 (two) times daily. 180 tablet 1  . cetirizine (ZYRTEC) 10 MG tablet Take 1 tablet (10 mg total) by mouth daily. 30 tablet 11  . diclofenac sodium (VOLTAREN) 1 % GEL Apply 2 g topically 4 (four) times daily. 100 g 0  . dicyclomine (BENTYL) 20 MG tablet TAKE 1 TABLET BY MOUTH 3 TIMES DAILY BEFORE MEALS.  Needs to be seen by PCP prior to next RF request. 90 tablet  0  . losartan (COZAAR) 25 MG tablet Take 1 tablet (25 mg total) by mouth daily. 30 tablet 5  . meclizine (ANTIVERT) 25 MG tablet Take 1 tablet (25 mg total) by mouth 3 (three) times daily as needed for dizziness or nausea. (Patient not taking: Reported on 07/25/2017) 30 tablet 0  . omeprazole (PRILOSEC) 40 MG capsule Take 1 capsule (40 mg total) by mouth daily. 90 capsule 3   No current facility-administered medications on file prior to visit.     Allergies  Allergen Reactions  . Aspirin Other (See Comments)    stomach pain, stomach bleeding  . Penicillins Nausea And  Vomiting and Other (See Comments)    Dizzy Has patient had a PCN reaction causing immediate rash, facial/tongue/throat swelling, SOB or lightheadedness with hypotension: No Has patient had a PCN reaction causing severe rash involving mucus membranes or skin necrosis: No Has patient had a PCN reaction that required hospitalization; No Has patient had a PCN reaction occurring within the last 10 years: No If all of the above answers are "NO", then may proceed with Cephalosporin use.   . Latex Itching  . Streptomycin Nausea And Vomiting and Rash  . Tramadol Nausea Only    Social History   Socioeconomic History  . Marital status: Married    Spouse name: Not on file  . Number of children: 6  . Years of education: 57   . Highest education level: Not on file  Occupational History  . Occupation: Unemployed   Social Needs  . Financial resource strain: Not on file  . Food insecurity:    Worry: Not on file    Inability: Not on file  . Transportation needs:    Medical: Not on file    Non-medical: Not on file  Tobacco Use  . Smoking status: Never Smoker  . Smokeless tobacco: Never Used  Substance and Sexual Activity  . Alcohol use: Yes    Comment: occasional wine  . Drug use: No  . Sexual activity: Yes    Birth control/protection: None  Lifestyle  . Physical activity:    Days per week: Not on file    Minutes per session: Not on file  . Stress: Not on file  Relationships  . Social connections:    Talks on phone: Not on file    Gets together: Not on file    Attends religious service: Not on file    Active member of club or organization: Not on file    Attends meetings of clubs or organizations: Not on file    Relationship status: Not on file  . Intimate partner violence:    Fear of current or ex partner: Not on file    Emotionally abused: Not on file    Physically abused: Not on file    Forced sexual activity: Not on file  Other Topics Concern  . Not on file  Social  History Narrative   From [place].   Lived in [place] since 1994.    Live with husband.   6 adult children.    Speaks some English and reads some  [place].    Caffeine use: Coffee daily   Right handed    Family History  Problem Relation Age of Onset  . Hypertension Mother   . Stomach cancer Father   . Liver disease Maternal Uncle   . Lung cancer Maternal Grandmother     Past Surgical History:  Procedure Laterality Date  . COLONOSCOPY WITH PROPOFOL N/A 01/03/2015  Procedure: COLONOSCOPY WITH PROPOFOL;  Surgeon: Jerene Bears, MD;  Location: [facility];  Service: Gastroenterology;  Laterality: N/A;  . CYSTOSCOPY W/ URETERAL STENT PLACEMENT Left 06/17/2016   Procedure: CYSTOSCOPY WITH RETROGRADE PYELOGRAM/URETERAL STENT PLACEMENT;  Surgeon: Raynelle Bring, MD;  Location: [facility];  Service: Urology;  Laterality: Left;  . ESOPHAGOGASTRODUODENOSCOPY (EGD) WITH PROPOFOL N/A 01/03/2015   Procedure: ESOPHAGOGASTRODUODENOSCOPY (EGD) WITH PROPOFOL;  Surgeon: Jerene Bears, MD;  Location: [facility];  Service: Gastroenterology;  Laterality: N/A;  . ESOPHAGOGASTRODUODENOSCOPY ENDOSCOPY     several times  . HOT HEMOSTASIS N/A 01/03/2015   Procedure: HOT HEMOSTASIS (ARGON PLASMA COAGULATION/BICAP);  Surgeon: Jerene Bears, MD;  Location: [facility];  Service: Gastroenterology;  Laterality: N/A;  . NO PAST SURGERIES    . ROBOT ASSISTED PYELOPLASTY Left 06/17/2016   Procedure: XI ROBOTIC ASSISTED PYELOPLASTY;  Surgeon: Raynelle Bring, MD;  Location: [facility];  Service: Urology;  Laterality: Left;    ROS: Review of Systems Negative except as stated above. PHYSICAL EXAM: BP 132/81   Pulse 74   Temp 98.1 F (36.7 C) (Oral)   Resp 16   Wt 151 lb 9.6 oz (68.8 kg)   SpO2 98%   BMI 26.85 kg/m   Physical Exam  General appearance - alert, well appearing, and in no distress Mental status - normal mood, behavior, speech, dress, motor activity, and thought processes Neck - supple, no significant  adenopathy Chest - clear to auscultation, no wheezes, rales or rhonchi, symmetric air entry Heart - normal rate, regular rhythm, normal S1, S2, no murmurs, rubs, clicks or gallops.  No reproducible tenderness on palpation of chest wall. Abdomen: Normal bowel sounds, soft and nontender.  No palpable masses Musculoskeletal -no tenderness on palpation of the left side.  No flank tenderness. Extremities - peripheral pulses normal, no pedal edema, no clubbing or cyanosis  EKG revealed sinus bradycardia at 55 otherwise normal EKG  Results for orders placed or performed in visit on 12/26/17  POCT urinalysis dipstick  Result Value Ref Range   Color, UA yellow    Clarity, UA clear    Glucose, UA Negative Negative   Bilirubin, UA negative    Ketones, UA negative    Spec Grav, UA 1.010 1.010 - 1.025   Blood, UA negative    pH, UA 6.0 5.0 - 8.0   Protein, UA Negative Negative   Urobilinogen, UA 0.2 0.2 or 1.0 E.U./dL   Nitrite, UA negative    Leukocytes, UA Negative Negative   Appearance     Odor      ASSESSMENT AND PLAN: 1. Essential hypertension At goal.  Continue current medications  2. Atypical chest pain Based on history physical exam and EKG changes, does not seem consistent with cardiac pain.  Recommend Tylenol as needed - EKG 12-Lead  3. Chronic left-sided back pain, unspecified back location - POCT urinalysis dipstick   Patient was given the opportunity to ask questions.  Patient verbalized understanding of the plan and was able to repeat key elements of the plan.   Orders Placed This Encounter  Procedures  . POCT urinalysis dipstick  . EKG 12-Lead     Requested Prescriptions   Signed Prescriptions Disp Refills  . polyethylene glycol powder (GLYCOLAX/MIRALAX) powder 255 g 4    Sig: Take 17 grams PO PRN    No follow-ups on file.  Karle Plumber, MD, FACP

## 2017-12-29 ENCOUNTER — Ambulatory Visit: Payer: Self-pay | Admitting: Internal Medicine

## 2018-01-16 MED FILL — OMEPRAZOLE DR 40 MG CAPSULE: 40 | 30 days supply | Qty: 30 | Fill #5

## 2018-01-16 MED FILL — POLYETHYLENE GLYCOL 3350 PO: 28 days supply | Qty: 476 | Fill #0

## 2018-01-16 MED FILL — ?ATORVASTATIN 20 MG TABLET: 20 | 30 days supply | Qty: 30 | Fill #6

## 2018-01-19 MED FILL — AMLODIPINE BESYLATE 10 MG T: 10 | 30 days supply | Qty: 30 | Fill #5

## 2018-01-20 ENCOUNTER — Other Ambulatory Visit: Payer: Self-pay

## 2018-01-20 DIAGNOSIS — J301 Allergic rhinitis due to pollen: Secondary | ICD-10-CM

## 2018-01-20 DIAGNOSIS — I10 Essential (primary) hypertension: Secondary | ICD-10-CM

## 2018-01-20 MED ORDER — LOSARTAN POTASSIUM 25 MG PO TABS: 25.0000 mg | ORAL_TABLET | Freq: Every day | ORAL | 2 refills | Status: DC

## 2018-01-20 MED ORDER — CETIRIZINE HCL 10 MG PO TABS: 10.0000 mg | ORAL_TABLET | Freq: Every day | ORAL | 2 refills | Status: DC

## 2018-01-20 MED FILL — LOSARTAN POTASSIUM 25 MG TA: 25 | 30 days supply | Qty: 30 | Fill #0

## 2018-01-20 MED FILL — ?CETIRIZINE HCL 10 MG TABLE: 10 | 30 days supply | Qty: 30 | Fill #0

## 2018-01-22 IMAGING — CT CT HEAD W/O CM
4 series · 15 of 47 positions shown, 17 images · non-contrast
Comparison: None.

CLINICAL DATA: Ataxia for 1 week.  Suspected stroke.

EXAM:
CT HEAD WITHOUT CONTRAST
TECHNIQUE: Contiguous axial images were obtained from the base of the skull
through the vertex without intravenous contrast.

[Series 3: head wo · axial · 0.47mm/px · z∈[-90,+30]mm · 7 of 32 slices shown, 9 images]
[im 4/32  brain]
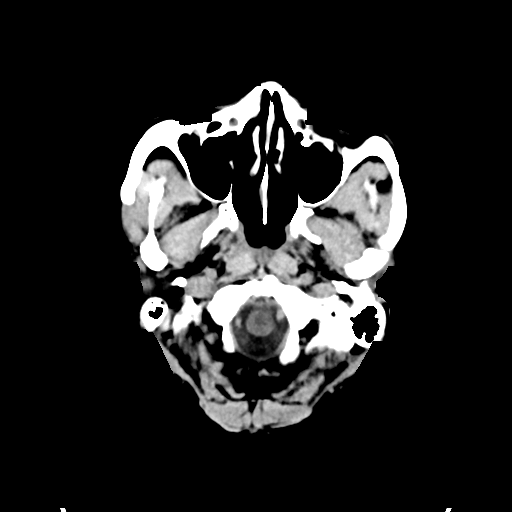
[im 4/32  bone]
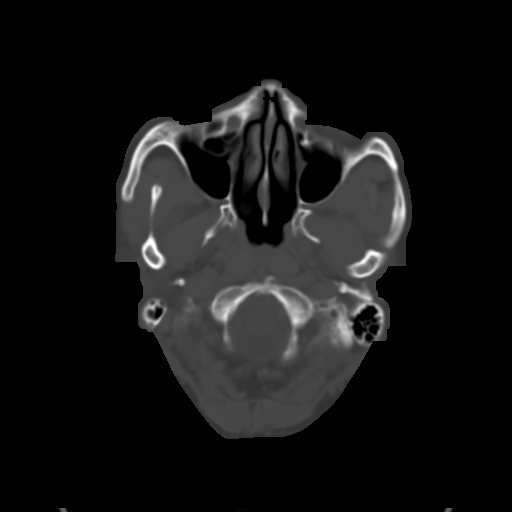
[im 8/32  brain]
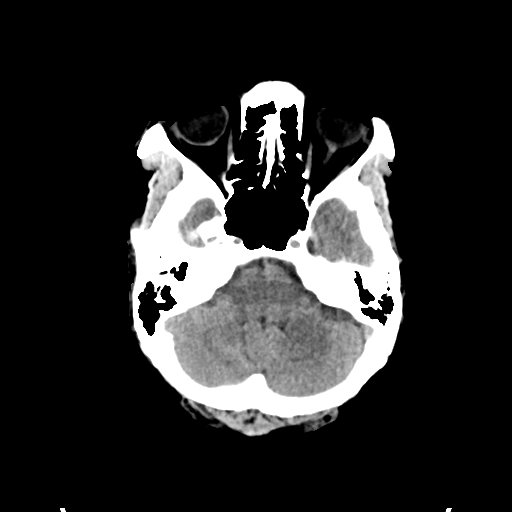
[im 12/32  brain]
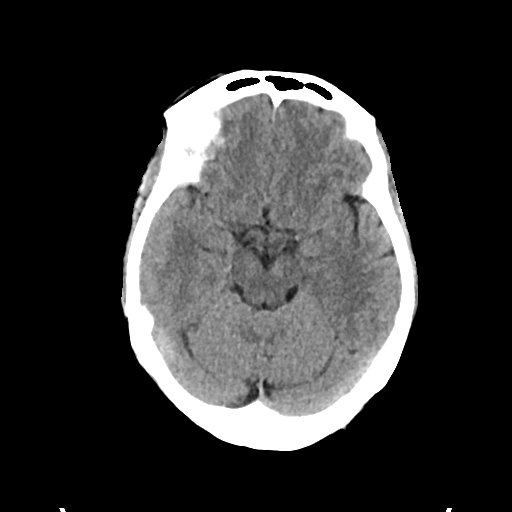
[im 16/32  brain]
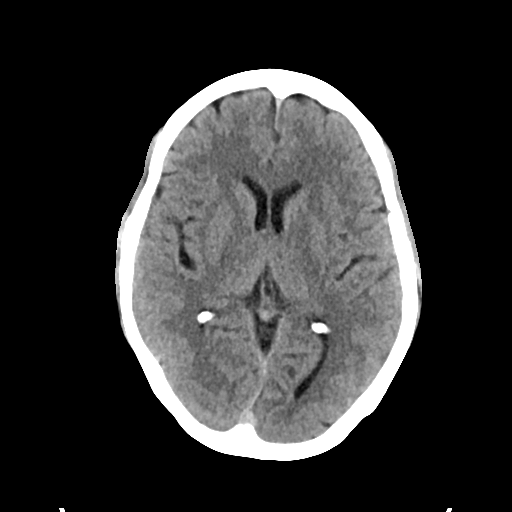
[im 20/32  brain]
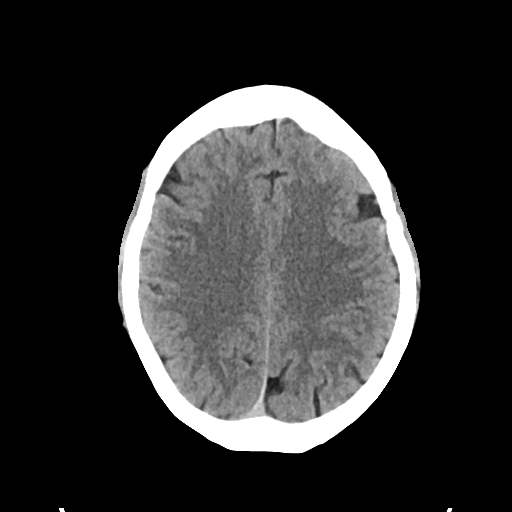
[im 20/32  bone]
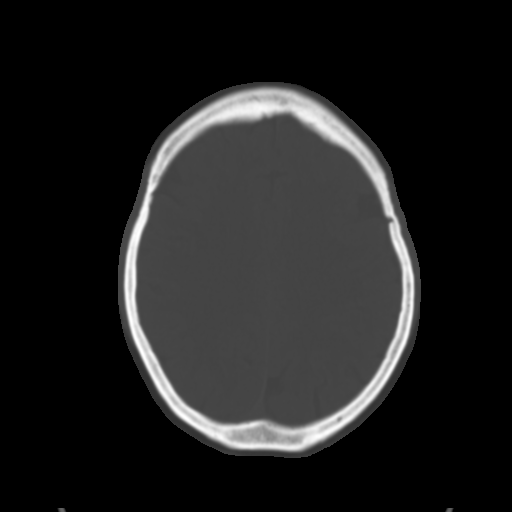
[im 24/32  brain]
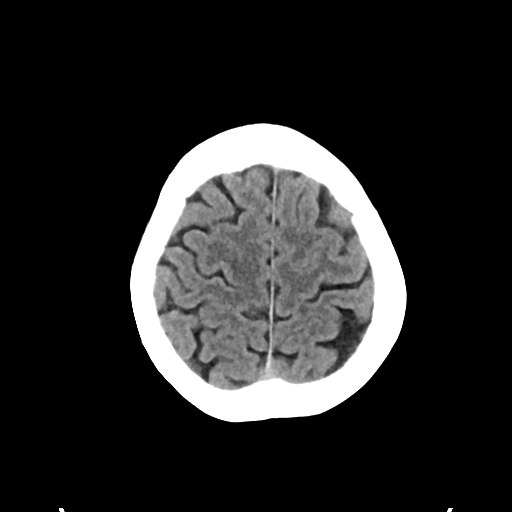
[im 28/32  brain]
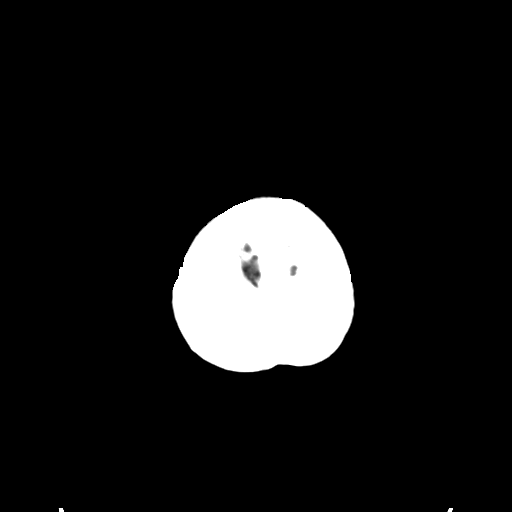

[Series 4: head bone · axial · 0.47mm/px · z∈[-90,-74]mm · 2 of 80 slices shown]
[im 8/80  bone]
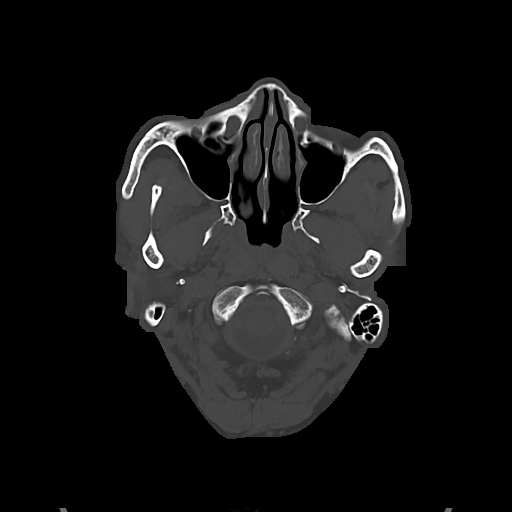
[im 16/80  bone]
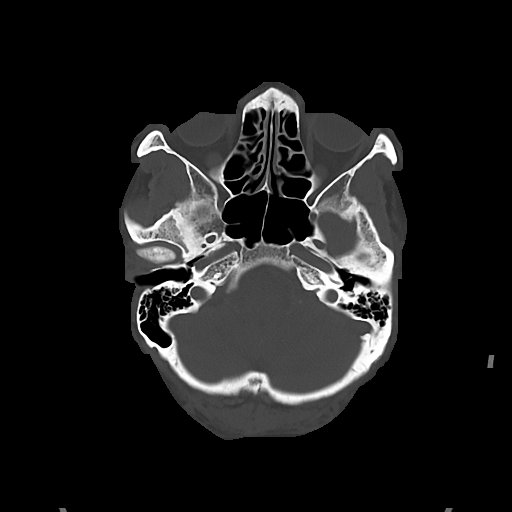

[Series 5: cor soft · coronal · 0.31mm/px · 3 of 75 slices shown]
[im 25/75  brain]
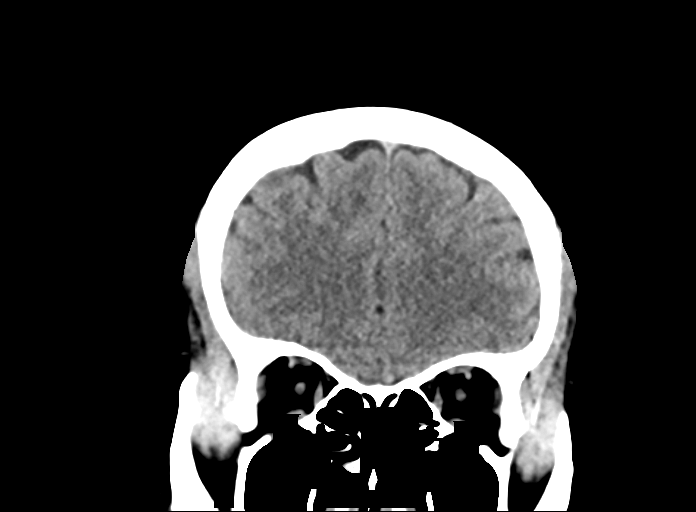
[im 33/75  brain]
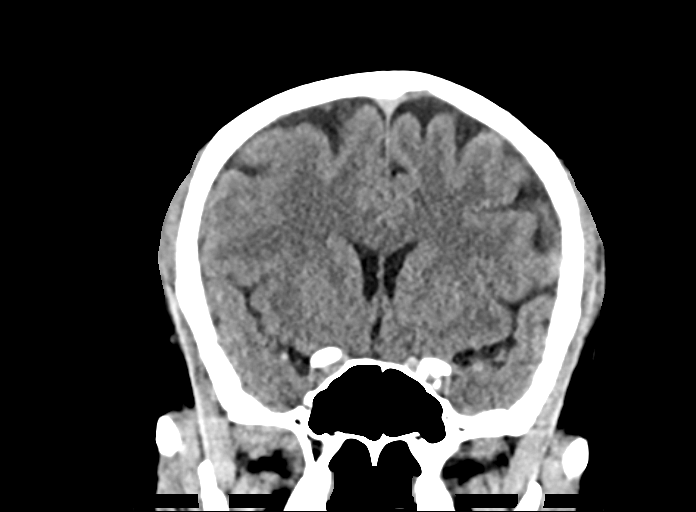
[im 42/75  brain]
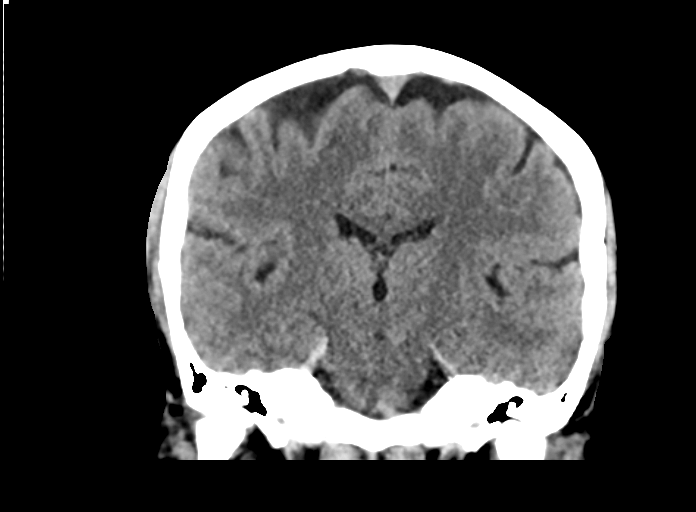

[Series 6: sag soft · sagittal · 0.31mm/px · 3 of 67 slices shown]
[im 23/67  brain]
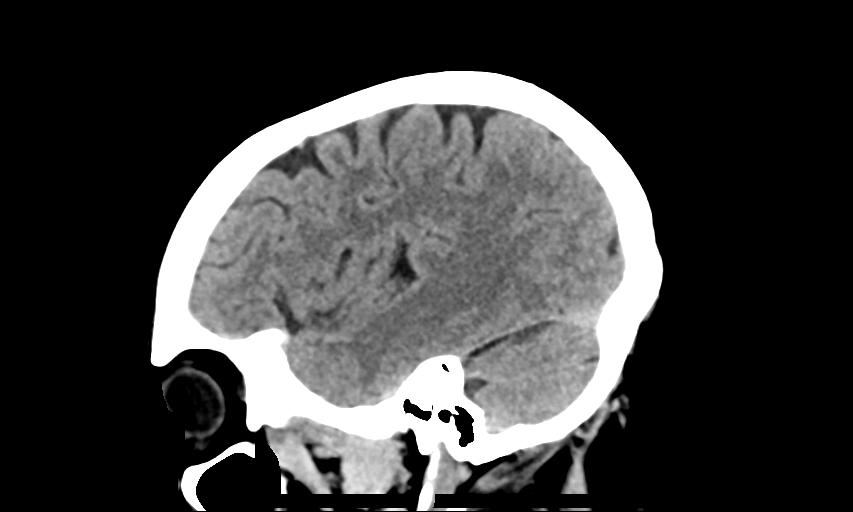
[im 34/67  brain]
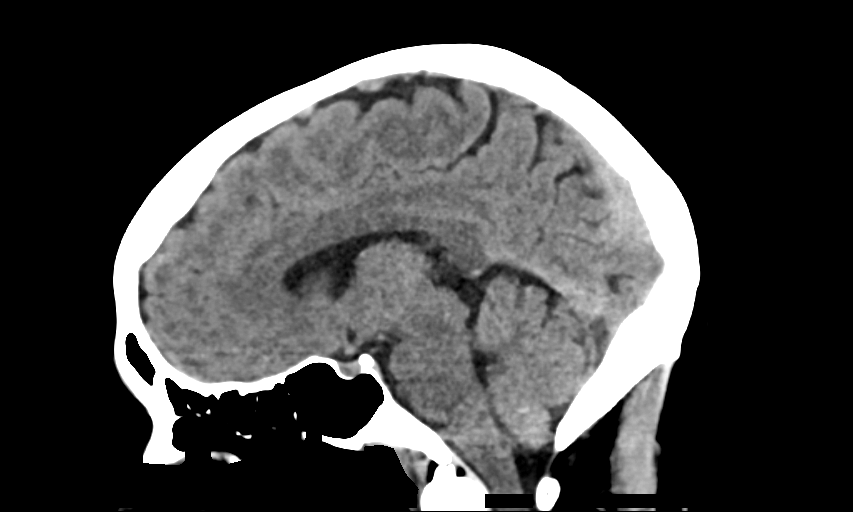
[im 45/67  brain]
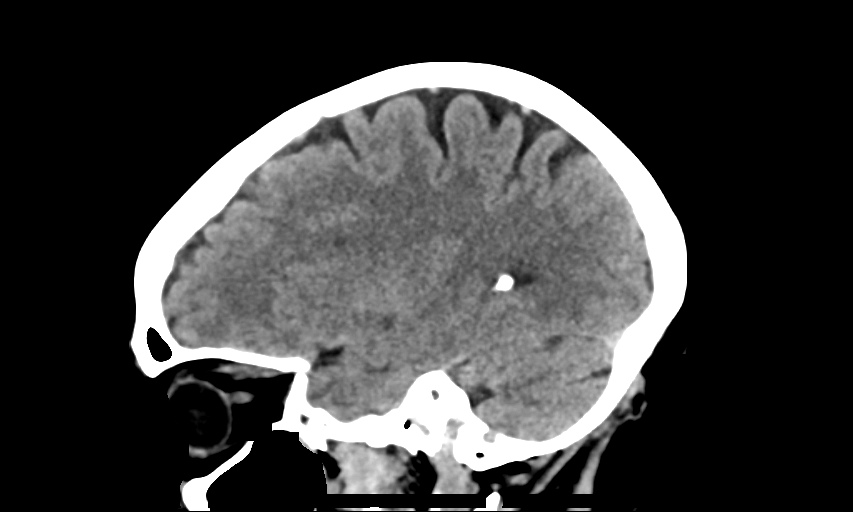

[15 of 47 positions shown; findings below may reference images not displayed]

FINDINGS: Brain: No evidence of acute infarction, hemorrhage, hydrocephalus,
extra-axial collection or mass lesion/mass effect.

Vascular: No hyperdense vessel or unexpected calcification.

Skull: Normal. Negative for fracture or focal lesion.

Sinuses/Orbits: Unremarkable.

Other: None.
IMPRESSION: Normal examination.

## 2018-02-16 ENCOUNTER — Other Ambulatory Visit: Payer: Self-pay | Admitting: Internal Medicine

## 2018-02-16 DIAGNOSIS — E785 Hyperlipidemia, unspecified: Secondary | ICD-10-CM

## 2018-02-16 MED FILL — AMLODIPINE BESYLATE 10 MG T: 10 | 30 days supply | Qty: 30 | Fill #6

## 2018-02-16 MED FILL — LOSARTAN POTASSIUM 25 MG TA: 25 | 30 days supply | Qty: 30 | Fill #1

## 2018-02-16 MED FILL — ?ATORVASTATIN 20 MG TABLET: 20 | 30 days supply | Qty: 30 | Fill #0

## 2018-02-16 MED FILL — OMEPRAZOLE DR 40 MG CAPSULE: 40 | 30 days supply | Qty: 30 | Fill #6

## 2018-02-16 MED FILL — POLYETHYLENE GLYCOL 3350 PO: 28 days supply | Qty: 476 | Fill #1

## 2018-03-16 MED FILL — ?ATORVASTATIN 20 MG TABLET: 20 | 30 days supply | Qty: 30 | Fill #1

## 2018-03-16 MED FILL — OMEPRAZOLE DR 40 MG CAPSULE: 40 | 30 days supply | Qty: 30 | Fill #7

## 2018-03-16 MED FILL — AMLODIPINE BESYLATE 10 MG T: 10 | 30 days supply | Qty: 30 | Fill #7

## 2018-03-16 MED FILL — LOSARTAN POTASSIUM 25 MG TA: 25 | 30 days supply | Qty: 30 | Fill #2

## 2018-03-16 MED FILL — POLYETHYLENE GLYCOL 3350 PO: 14 days supply | Qty: 238 | Fill #2

## 2018-03-30 ENCOUNTER — Ambulatory Visit: Payer: Medicare HMO | Attending: Internal Medicine | Admitting: Internal Medicine

## 2018-03-30 ENCOUNTER — Encounter: Payer: Self-pay | Admitting: Internal Medicine

## 2018-03-30 VITALS — BP 138/84 | HR 70 | Temp 98.4°F | Resp 16 | Wt 155.0 lb

## 2018-03-30 DIAGNOSIS — I10 Essential (primary) hypertension: Secondary | ICD-10-CM | POA: Insufficient documentation

## 2018-03-30 DIAGNOSIS — Z23 Encounter for immunization: Secondary | ICD-10-CM | POA: Diagnosis not present

## 2018-03-30 DIAGNOSIS — M542 Cervicalgia: Secondary | ICD-10-CM | POA: Diagnosis not present

## 2018-03-30 DIAGNOSIS — E785 Hyperlipidemia, unspecified: Secondary | ICD-10-CM | POA: Insufficient documentation

## 2018-03-30 DIAGNOSIS — G8929 Other chronic pain: Secondary | ICD-10-CM | POA: Insufficient documentation

## 2018-03-30 DIAGNOSIS — M19031 Primary osteoarthritis, right wrist: Secondary | ICD-10-CM | POA: Insufficient documentation

## 2018-03-30 DIAGNOSIS — Z79899 Other long term (current) drug therapy: Secondary | ICD-10-CM | POA: Insufficient documentation

## 2018-03-30 DIAGNOSIS — Z86010 Personal history of colonic polyps: Secondary | ICD-10-CM | POA: Insufficient documentation

## 2018-03-30 DIAGNOSIS — G43909 Migraine, unspecified, not intractable, without status migrainosus: Secondary | ICD-10-CM | POA: Insufficient documentation

## 2018-03-30 DIAGNOSIS — Z8249 Family history of ischemic heart disease and other diseases of the circulatory system: Secondary | ICD-10-CM | POA: Insufficient documentation

## 2018-03-30 DIAGNOSIS — Z1239 Encounter for other screening for malignant neoplasm of breast: Secondary | ICD-10-CM | POA: Diagnosis not present

## 2018-03-30 DIAGNOSIS — K589 Irritable bowel syndrome without diarrhea: Secondary | ICD-10-CM | POA: Insufficient documentation

## 2018-03-30 DIAGNOSIS — B181 Chronic viral hepatitis B without delta-agent: Secondary | ICD-10-CM | POA: Insufficient documentation

## 2018-03-30 MED ORDER — PNEUMOCOCCAL 13-VAL CONJ VACC IM SUSP: 0.5000 mL | INTRAMUSCULAR | 0 refills | Status: AC

## 2018-03-30 MED ORDER — LOSARTAN POTASSIUM 25 MG PO TABS: 50.0000 mg | ORAL_TABLET | Freq: Every day | ORAL | 6 refills | Status: DC

## 2018-03-30 MED ORDER — METHOCARBAMOL 500 MG PO TABS: 500.0000 mg | ORAL_TABLET | Freq: Two times a day (BID) | ORAL | 0 refills | Status: DC | PRN

## 2018-03-30 MED FILL — METHOCARBAMOL 500 MG TABS: 500 | 15 days supply | Qty: 30 | Fill #0

## 2018-03-30 MED FILL — LOSARTAN POTASSIUM 25 MG TA: 25 | 15 days supply | Qty: 30 | Fill #0

## 2018-03-30 NOTE — Progress Notes (Signed)
Patient ID: Jacqueline Orozco, female    DOB: 06-Sep-1950  MRN: [id number]  CC: Hypertension   Subjective: Jacqueline Orozco is a 67 y.o. female who presents for chronic disease management.  Pt has an interpreter with her today.   Interpreter, Leodis Liverpool, from [employer]. Her concerns today include:  Hx of Chronic LBPdue to disc ds, HTN, HL, L hydronephrosis s/p pyeloplasty 06/2016, chronic hepB, Migraines  Pt c/o pain LT side of neck into LT arm x 2-3 wks.  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 Does a lot of repetitive movement with her arms including overhead lifting on average about 10 pounds.  Some numbness in forearm and hand.  Endorses weakness in hand.  She quit working 03/12/2018 -Seen at [facility] 03/14/2018 for same.  Given some Tylenol and told she has muscle pain.  Taking 2 pills BID -pain little better.   HTN:  Compliant with meds and salt restriction.  Took meds already for the morning.  HM:  Due for Prevnar 13.  Reportedly had flu shot last week at [facility].   Last Pap was in 2016 with negative HPV.  Patient Active Problem List   Diagnosis Date Noted  . Motion sickness 08/26/2017  . Meniere disease, left 07/25/2017  . 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 Anterolisthesis 12/24/2016  . Allergic contact dermatitis due to adhesives 07/02/2016  . Hydronephrosis with ureteropelvic junction (UPJ) obstruction 04/15/2016  . Osteoarthritis of right wrist 07/28/2015  . Gastric and duodenal angiodysplasia   . Seasonal allergies 09/15/2014  . IBS (irritable bowel syndrome) 06/20/2014  . Lumbar disc disease 09/29/2013  . Gastric AVM 12/29/2012  . Hx of adenomatous colonic polyps 10/16/2012  . Plantar fasciitis, right 05/19/2012  . Cervical radiculitis 02/12/2012  . Vertigo 05/22/2011  . HEPATITIS B, CHRONIC 03/28/2010  . HLD (hyperlipidemia) 03/28/2010  . Essential hypertension 03/28/2010     Current Outpatient Medications on File Prior to Visit    Medication Sig Dispense Refill  . acetaminophen (TYLENOL 8 HOUR) 650 MG CR tablet Take 1 tablet (650 mg total) by mouth every 8 (eight) hours as needed for pain. 90 tablet 1  . amLODipine (NORVASC) 10 MG tablet Take 1 tablet (10 mg total) by mouth daily. 90 tablet 3  . atorvastatin (LIPITOR) 20 MG tablet TAKE 1 TABLET BY MOUTH DAILY. 30 tablet 2  . benzonatate (TESSALON) 100 MG capsule Take 2 capsules (200 mg total) by mouth 3 (three) times daily as needed for cough. (Patient not taking: Reported on 03/30/2018) 40 capsule 0  . Calcium Citrate 250 MG TABS Take 2 tablets (500 mg total) by mouth daily. 60 tablet 11  . calcium citrate-vitamin D 500-400 MG-UNIT chewable tablet Chew 1 tablet by mouth 2 (two) times daily. 180 tablet 1  . cetirizine (ZYRTEC) 10 MG tablet Take 1 tablet (10 mg total) by mouth daily. 30 tablet 2  . diclofenac sodium (VOLTAREN) 1 % GEL Apply 2 g topically 4 (four) times daily. 100 g 0  . dicyclomine (BENTYL) 20 MG tablet TAKE 1 TABLET BY MOUTH 3 TIMES DAILY BEFORE MEALS.  Needs to be seen by PCP prior to next RF request. 90 tablet 0  . losartan (COZAAR) 25 MG tablet Take 1 tablet (25 mg total) by mouth daily. 30 tablet 2  . meclizine (ANTIVERT) 25 MG tablet Take 1 tablet (25 mg total) by mouth 3 (three) times daily as needed for dizziness or nausea. (Patient not taking: Reported  on 07/25/2017) 30 tablet 0  . omeprazole (PRILOSEC) 40 MG capsule Take 1 capsule (40 mg total) by mouth daily. 90 capsule 3  . polyethylene glycol powder (GLYCOLAX/MIRALAX) powder Take 17 grams PO PRN 255 g 4   No current facility-administered medications on file prior to visit.     Allergies  Allergen Reactions  . Aspirin Other (See Comments)    stomach pain, stomach bleeding  . Penicillins Nausea And Vomiting and Other (See Comments)    Dizzy Has patient had a PCN reaction causing immediate rash, facial/tongue/throat swelling, SOB or lightheadedness with hypotension: No Has patient had a PCN  reaction causing severe rash involving mucus membranes or skin necrosis: No Has patient had a PCN reaction that required hospitalization; No Has patient had a PCN reaction occurring within the last 10 years: No If all of the above answers are "NO", then may proceed with Cephalosporin use.   . Latex Itching  . Streptomycin Nausea And Vomiting and Rash  . Tramadol Nausea Only    Social History   Socioeconomic History  . Marital status: Married    Spouse name: Not on file  . Number of children: 6  . Years of education: 60   . Highest education level: Not on file  Occupational History  . Occupation: Unemployed   Social Needs  . Financial resource strain: Not on file  . Food insecurity:    Worry: Not on file    Inability: Not on file  . Transportation needs:    Medical: Not on file    Non-medical: Not on file  Tobacco Use  . Smoking status: Never Smoker  . Smokeless tobacco: Never Used  Substance and Sexual Activity  . Alcohol use: Yes    Comment: occasional wine  . Drug use: No  . Sexual activity: Yes    Birth control/protection: None  Lifestyle  . Physical activity:    Days per week: Not on file    Minutes per session: Not on file  . Stress: Not on file  Relationships  . Social connections:    Talks on phone: Not on file    Gets together: Not on file    Attends religious service: Not on file    Active member of club or organization: Not on file    Attends meetings of clubs or organizations: Not on file    Relationship status: Not on file  . Intimate partner violence:    Fear of current or ex partner: Not on file    Emotionally abused: Not on file    Physically abused: Not on file    Forced sexual activity: Not on file  Other Topics Concern  . Not on file  Social History Narrative   From [place].   Lived in [place] since 1994.    Live with husband.   6 adult children.    Speaks some English and reads some  [place].    Caffeine use: Coffee daily   Right handed     Family History  Problem Relation Age of Onset  . Hypertension Mother   . Stomach cancer Father   . Liver disease Maternal Uncle   . Lung cancer Maternal Grandmother     Past Surgical History:  Procedure Laterality Date  . COLONOSCOPY WITH PROPOFOL N/A 01/03/2015   Procedure: COLONOSCOPY WITH PROPOFOL;  Surgeon: Jerene Bears, MD;  Location: [facility];  Service: Gastroenterology;  Laterality: N/A;  . CYSTOSCOPY W/ URETERAL STENT PLACEMENT Left 06/17/2016   Procedure:  CYSTOSCOPY WITH RETROGRADE PYELOGRAM/URETERAL STENT PLACEMENT;  Surgeon: Raynelle Bring, MD;  Location: [facility];  Service: Urology;  Laterality: Left;  . ESOPHAGOGASTRODUODENOSCOPY (EGD) WITH PROPOFOL N/A 01/03/2015   Procedure: ESOPHAGOGASTRODUODENOSCOPY (EGD) WITH PROPOFOL;  Surgeon: Jerene Bears, MD;  Location: [facility];  Service: Gastroenterology;  Laterality: N/A;  . ESOPHAGOGASTRODUODENOSCOPY ENDOSCOPY     several times  . HOT HEMOSTASIS N/A 01/03/2015   Procedure: HOT HEMOSTASIS (ARGON PLASMA COAGULATION/BICAP);  Surgeon: Jerene Bears, MD;  Location: [facility];  Service: Gastroenterology;  Laterality: N/A;  . NO PAST SURGERIES    . ROBOT ASSISTED PYELOPLASTY Left 06/17/2016   Procedure: XI ROBOTIC ASSISTED PYELOPLASTY;  Surgeon: Raynelle Bring, MD;  Location: [facility];  Service: Urology;  Laterality: Left;    ROS: Review of Systems Negative except as above PHYSICAL EXAM: BP 138/84   Pulse 70   Temp 98.4 F (36.9 C) (Oral)   Resp 16   Wt 155 lb (70.3 kg)   SpO2 98%   BMI 27.46 kg/m   BP 140/75 Physical Exam  General appearance - alert, well appearing, and in no distress.  Patient is somewhat of a difficult historian.  She is very talkative and has to be redirected to giving yes and no answers. Mental status - normal mood, behavior, speech, dress, motor activity, and thought processes Neck - supple, no significant adenopathy Chest - clear to auscultation, no wheezes, rales or rhonchi, symmetric air  entry Heart - normal rate, regular rhythm, normal S1, S2, no murmurs, rubs, clicks or gallops Musculoskeletal -patient with good rotation and flexion extension of the neck.  No discomfort on palpation of the cervical spine.  Mild tenderness on palpation of the trapezius and supraspinatus muscles on the left side.  Power proximally and distally in the upper extremities is 5/5 bilaterally.  Grip 4+/5 left side, 5/5 right side.  Gross sensation intact in the upper extremities Extremities - peripheral pulses normal, no pedal edema, no clubbing or cyanosis  ASSESSMENT AND PLAN: 1. Neck pain Symptoms on exam consistent with this being musculoskeletal in nature with possible nerve irritation.  I recommend using Voltaren gel up to 4 times daily over the left shoulder.  I have given some muscle relaxant to use as needed with it. - methocarbamol (ROBAXIN) 500 MG tablet; Take 1 tablet (500 mg total) by mouth 2 (two) times daily as needed for muscle spasms.  Dispense: 30 tablet; Refill: 0  2. Essential hypertension Not at goal.  Increase Cozaar to 50 mg daily - losartan (COZAAR) 25 MG tablet; Take 2 tablets (50 mg total) by mouth daily.  Dispense: 30 tablet; Refill: 6  3. Need for vaccination against Streptococcus pneumoniae using pneumococcal conjugate vaccine 13   4. Breast cancer screening - MM Digital Screening; Future  Patient was given the opportunity to ask questions.  Patient verbalized understanding of the plan and was able to repeat key elements of the plan.   No orders of the defined types were placed in this encounter.    Requested Prescriptions    No prescriptions requested or ordered in this encounter    No follow-ups on file.  Karle Plumber, MD, FACP

## 2018-03-30 NOTE — Patient Instructions (Addendum)
Your blood pressure is not at goal of 130/80 or lower.  I recommend increasing Cozaar from 25 mg daily to 50 mg daily.  Use the Voltaren gel on the posterior neck and shoulder.  I have also prescribed a muscle relaxant called methocarbamol to use as needed.  This can cause some drowsiness.  Pneumococcal Conjugate Vaccine (PCV13) What You Need to Know 1. Why get vaccinated? Vaccination can protect both children and adults from pneumococcal disease. Pneumococcal disease is caused by bacteria that can spread from person to person through close contact. It can cause ear infections, and it can also lead to more serious infections of the:  Lungs (pneumonia),  Blood (bacteremia), and  Covering of the brain and spinal cord (meningitis).  Pneumococcal pneumonia is most common among adults. Pneumococcal meningitis can cause deafness and brain damage, and it kills about 1 child in 10 who get it. Anyone can get pneumococcal disease, but children under 49 years of age and adults 21 years and older, people with certain medical conditions, and cigarette smokers are at the highest risk. Before there was a vaccine, the [place] saw:  more than 700 cases of meningitis,  about 13,000 blood infections,  about 5 million ear infections, and  about 200 deaths  in children under 5 each year from pneumococcal disease. Since vaccine became available, severe pneumococcal disease in these children has fallen by 88%. About 18,000 older adults die of pneumococcal disease each year in the [place]. Treatment of pneumococcal infections with penicillin and other drugs is not as effective as it used to be, because some strains of the disease have become resistant to these drugs. This makes prevention of the disease, through vaccination, even more important. 2. PCV13 vaccine Pneumococcal conjugate vaccine (called PCV13) protects against 13 types of pneumococcal bacteria. PCV13 is routinely given to children at  2, 4, 6, and 53-50 months of age. It is also recommended for children and adults 98 to 84 years of age with certain health conditions, and for all adults 66 years of age and older. Your doctor can give you details. 3. Some people should not get this vaccine Anyone who has ever had a life-threatening allergic reaction to a dose of this vaccine, to an earlier pneumococcal vaccine called PCV7, or to any vaccine containing diphtheria toxoid (for example, DTaP), should not get PCV13. Anyone with a severe allergy to any component of PCV13 should not get the vaccine. Tell your doctor if the person being vaccinated has any severe allergies. If the person scheduled for vaccination is not feeling well, your healthcare provider might decide to reschedule the shot on another day. 4. Risks of a vaccine reaction With any medicine, including vaccines, there is a chance of reactions. These are usually mild and go away on their own, but serious reactions are also possible. Problems reported following PCV13 varied by age and dose in the series. The most common problems reported among children were:  About half became drowsy after the shot, had a temporary loss of appetite, or had redness or tenderness where the shot was given.  About 1 out of 3 had swelling where the shot was given.  About 1 out of 3 had a mild fever, and about 1 in 20 had a fever over 102.5F.  Up to about 8 out of 10 became fussy or irritable.  Adults have reported pain, redness, and swelling where the shot was given; also mild fever, fatigue, headache, chills, or muscle pain. Young children  who get PCV13 along with inactivated flu vaccine at the same time may be at increased risk for seizures caused by fever. Ask your doctor for more information. Problems that could happen after any vaccine:  People sometimes faint after a medical procedure, including vaccination. Sitting or lying down for about 15 minutes can help prevent fainting, and  injuries caused by a fall. Tell your doctor if you feel dizzy, or have vision changes or ringing in the ears.  Some older children and adults get severe pain in the shoulder and have difficulty moving the arm where a shot was given. This happens very rarely.  Any medication can cause a severe allergic reaction. Such reactions from a vaccine are very rare, estimated at about 1 in a million doses, and would happen within a few minutes to a few hours after the vaccination. As with any medicine, there is a very small chance of a vaccine causing a serious injury or death. The safety of vaccines is always being monitored. For more information, visit: http://www.aguilar.org/ 5. What if there is a serious reaction? What should I look for? Look for anything that concerns you, such as signs of a severe allergic reaction, very high fever, or unusual behavior. Signs of a severe allergic reaction can include hives, swelling of the face and throat, difficulty breathing, a fast heartbeat, dizziness, and weakness-usually within a few minutes to a few hours after the vaccination. What should I do?  If you think it is a severe allergic reaction or other emergency that can't wait, call 9-1-1 or get the person to the nearest hospital. Otherwise, call your doctor.  Reactions should be reported to the Vaccine Adverse Event Reporting System (VAERS). Your doctor should file this report, or you can do it yourself through the VAERS web site at www.vaers.SamedayNews.es, or by calling (867)125-1390. ? VAERS does not give medical advice. 6. The National Vaccine Injury Compensation Program The [employer] Vaccine Injury Compensation Program (VICP) is a federal program that was created to compensate people who may have been injured by certain vaccines. Persons who believe they may have been injured by a vaccine can learn about the program and about filing a claim by calling 828-867-6794 or visiting the [place] website at  GoldCloset.com.ee. There is a time limit to file a claim for compensation. 7. How can I learn more?  Ask your healthcare provider. He or she can give you the vaccine package insert or suggest other sources of information.  Call your local or state health department.  Contact the Centers for Disease Control and Prevention (CDC): ? Call 224-083-6901 (1-800-CDC-INFO) or ? Visit CDC's website at http://hunter.com/ Vaccine Information Statement, PCV13 Vaccine (04/14/2014) This information is not intended to replace advice given to you by your health care provider. Make sure you discuss any questions you have with your health care provider. Document Released: 03/24/2006 Document Revised: 02/15/2016 Document Reviewed: 02/15/2016 Elsevier Interactive Patient Education  2017 [employer].

## 2018-04-07 MED FILL — PREVNAR 13 SYRINGE: 1 days supply | Qty: 1 | Fill #0

## 2018-04-17 MED FILL — LOSARTAN POTASSIUM 25 MG TA: 25 | 30 days supply | Qty: 60 | Fill #1

## 2018-04-17 MED FILL — AMLODIPINE BESYLATE 10 MG T: 10 | 30 days supply | Qty: 30 | Fill #8

## 2018-04-17 MED FILL — OMEPRAZOLE DR 40 MG CAPSULE: 40 | 30 days supply | Qty: 30 | Fill #8

## 2018-04-17 MED FILL — ?ATORVASTATIN 20 MG TABLET: 20 | 30 days supply | Qty: 30 | Fill #2

## 2018-04-21 ENCOUNTER — Other Ambulatory Visit: Payer: Self-pay

## 2018-04-21 MED ORDER — POLYETHYLENE GLYCOL 3350 17 GM/SCOOP PO POWD: ORAL | 4 refills | Status: AC

## 2018-05-06 ENCOUNTER — Ambulatory Visit
Admission: RE | Admit: 2018-05-06 | Discharge: 2018-05-06 | Disposition: A | Discharge status: Home / Self Care | Payer: Medicare HMO | Source: Ambulatory Visit | Attending: Internal Medicine | Admitting: Internal Medicine

## 2018-05-06 DIAGNOSIS — Z1239 Encounter for other screening for malignant neoplasm of breast: Secondary | ICD-10-CM

## 2018-05-11 ENCOUNTER — Other Ambulatory Visit: Payer: Self-pay

## 2018-05-11 DIAGNOSIS — K589 Irritable bowel syndrome without diarrhea: Secondary | ICD-10-CM

## 2018-05-11 DIAGNOSIS — M542 Cervicalgia: Secondary | ICD-10-CM

## 2018-05-11 DIAGNOSIS — I10 Essential (primary) hypertension: Secondary | ICD-10-CM

## 2018-05-11 DIAGNOSIS — E785 Hyperlipidemia, unspecified: Secondary | ICD-10-CM

## 2018-05-11 MED ORDER — ATORVASTATIN CALCIUM 20 MG PO TABS: 20.0000 mg | ORAL_TABLET | Freq: Every day | ORAL | 3 refills | Status: DC

## 2018-05-11 MED ORDER — AMLODIPINE BESYLATE 10 MG PO TABS: 10.0000 mg | ORAL_TABLET | Freq: Every day | ORAL | 3 refills | Status: DC

## 2018-05-11 MED ORDER — OMEPRAZOLE 40 MG PO CPDR: 40.0000 mg | DELAYED_RELEASE_CAPSULE | Freq: Every day | ORAL | 3 refills | Status: DC

## 2018-05-11 MED ORDER — LOSARTAN POTASSIUM 25 MG PO TABS: 50.0000 mg | ORAL_TABLET | Freq: Every day | ORAL | 3 refills | Status: DC

## 2018-05-20 ENCOUNTER — Other Ambulatory Visit: Payer: Self-pay

## 2018-05-20 DIAGNOSIS — M542 Cervicalgia: Secondary | ICD-10-CM

## 2018-05-20 MED ORDER — METHOCARBAMOL 500 MG PO TABS: 500.0000 mg | ORAL_TABLET | Freq: Two times a day (BID) | ORAL | 0 refills | Status: DC | PRN

## 2018-06-15 ENCOUNTER — Other Ambulatory Visit (HOSPITAL_COMMUNITY)
Admission: RE | Admit: 2018-06-15 | Discharge: 2018-06-15 | Disposition: A | Discharge status: Home / Self Care | Payer: Medicare PPO | Source: Ambulatory Visit | Attending: Family Medicine | Admitting: Family Medicine

## 2018-06-15 ENCOUNTER — Other Ambulatory Visit: Payer: Self-pay | Admitting: Family Medicine

## 2018-06-15 DIAGNOSIS — Z0000 Encounter for general adult medical examination without abnormal findings: Secondary | ICD-10-CM | POA: Diagnosis present

## 2018-06-17 LAB — CYTOLOGY - PAP
Diagnosis: NEGATIVE
HPV: NOT DETECTED

## 2018-06-22 ENCOUNTER — Other Ambulatory Visit: Payer: Self-pay | Admitting: Family Medicine

## 2018-06-22 DIAGNOSIS — Z13820 Encounter for screening for osteoporosis: Secondary | ICD-10-CM

## 2018-07-07 ENCOUNTER — Other Ambulatory Visit: Payer: Self-pay | Admitting: Gastroenterology

## 2018-07-07 DIAGNOSIS — R1084 Generalized abdominal pain: Secondary | ICD-10-CM

## 2018-07-07 DIAGNOSIS — B1910 Unspecified viral hepatitis B without hepatic coma: Secondary | ICD-10-CM

## 2018-07-13 ENCOUNTER — Other Ambulatory Visit: Payer: Self-pay | Admitting: Cardiology

## 2018-07-13 DIAGNOSIS — R0789 Other chest pain: Secondary | ICD-10-CM

## 2018-07-19 IMAGING — CT CT ABD-PELV W/ CM
2 of 5 series · 16 of 46 positions shown, 18 images · IV contrast (APPLIED)
Comparison: CT of the abdomen and pelvis performed 07/05/2016, and
renal ultrasound performed 12/05/2016

CLINICAL DATA: Acute onset of generalized abdominal pain, nausea,
vomiting and diarrhea.

EXAM:
CT ABDOMEN AND PELVIS WITH CONTRAST
TECHNIQUE: Multidetector CT imaging of the abdomen and pelvis was performed
using the standard protocol following bolus administration of
intravenous contrast.
CONTRAST:  100mL [id number] IOPAMIDOL ([id number]) INJECTION 61%

[Series 3: abdomen 5.0 · axial · 0.76mm/px · z∈[-682,-332]mm · 13 of 82 slices shown, 15 images]
[im 6/82  soft-tissue]
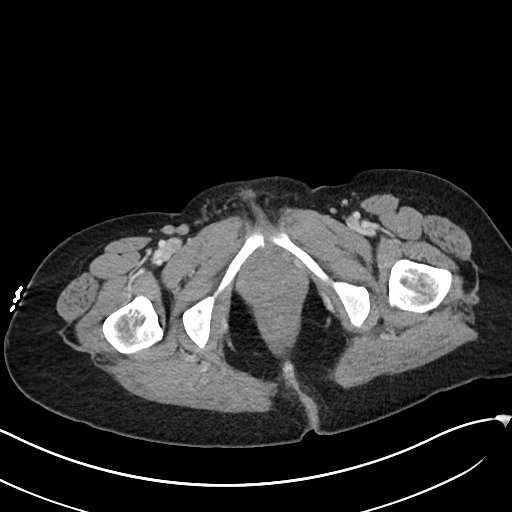
[im 6/82  bone]
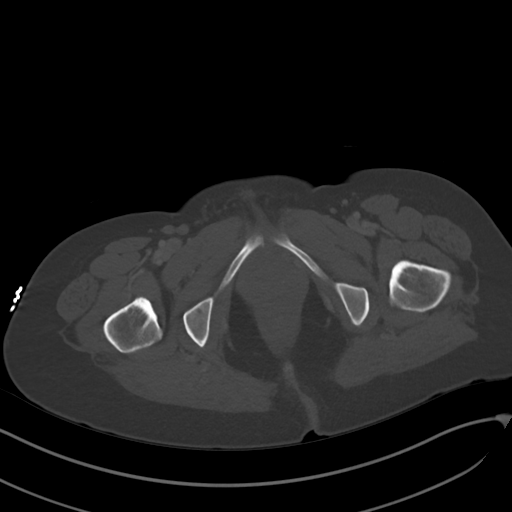
[im 11/82  soft-tissue]
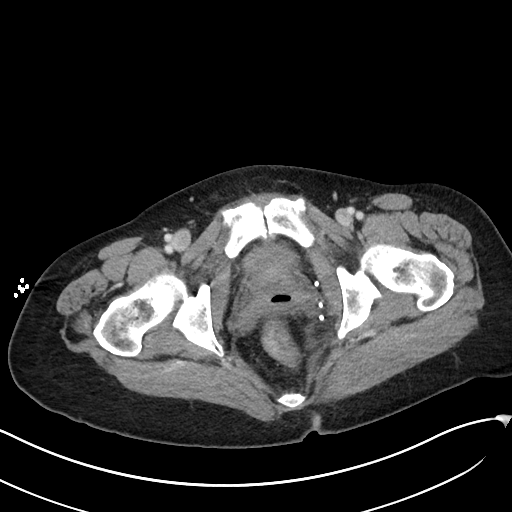
[im 16/82  soft-tissue]
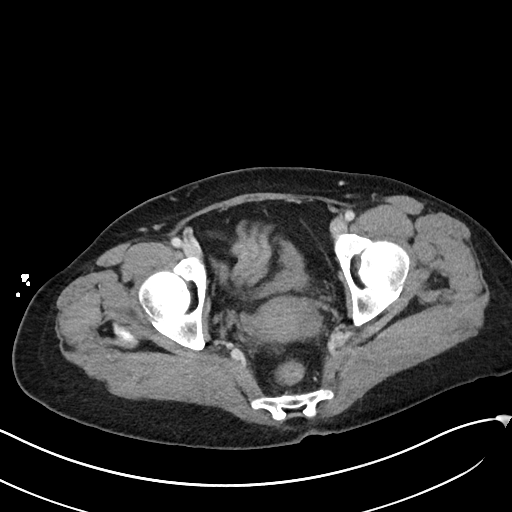
[im 26/82  soft-tissue]
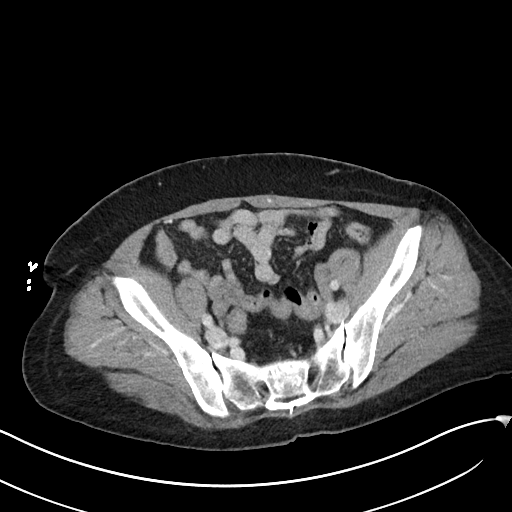
[im 31/82  soft-tissue]
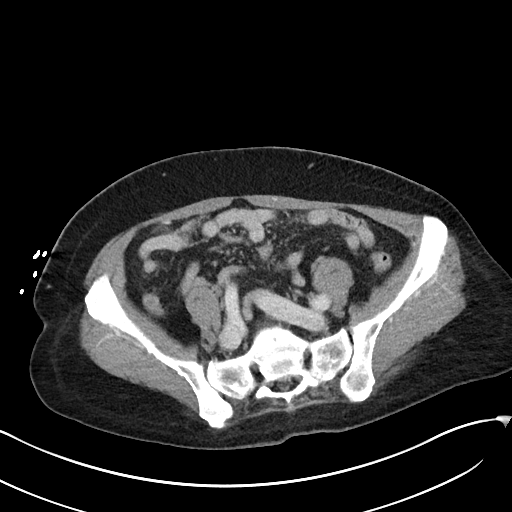
[im 36/82  soft-tissue]
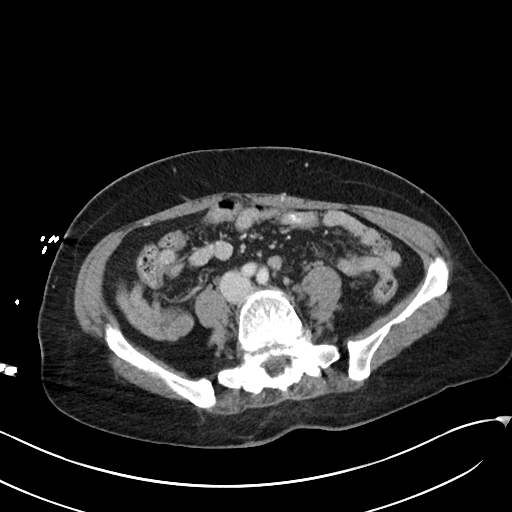
[im 41/82  soft-tissue]
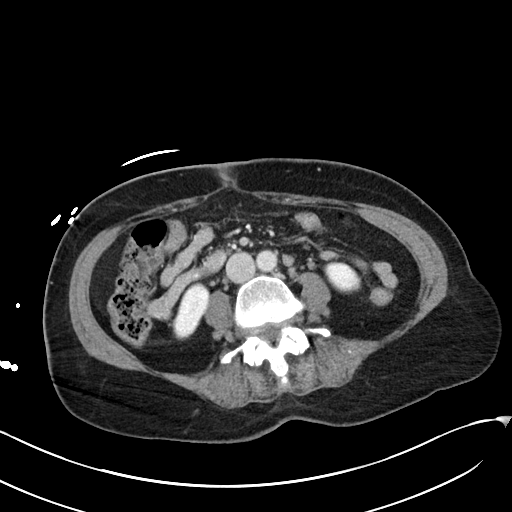
[im 46/82  soft-tissue]
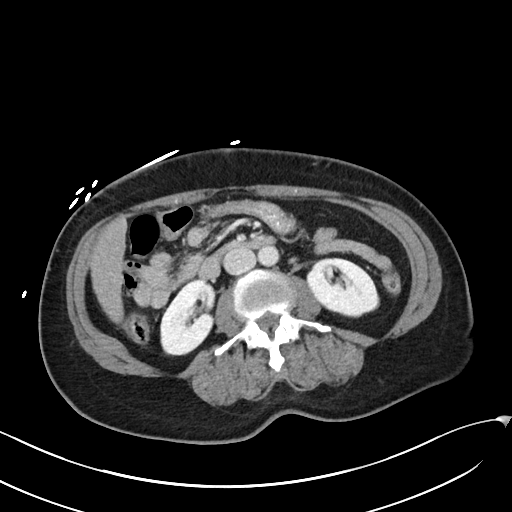
[im 51/82  soft-tissue]
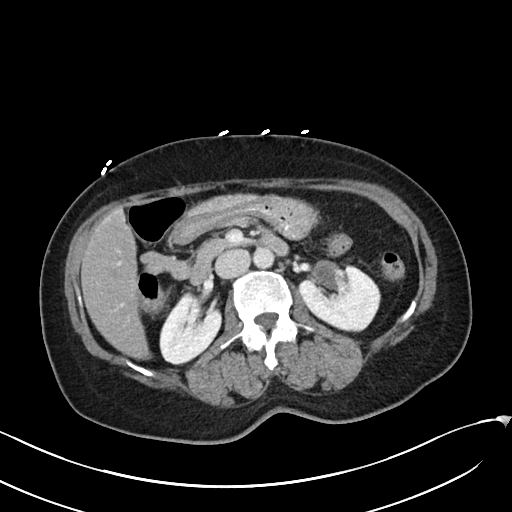
[im 51/82  bone]
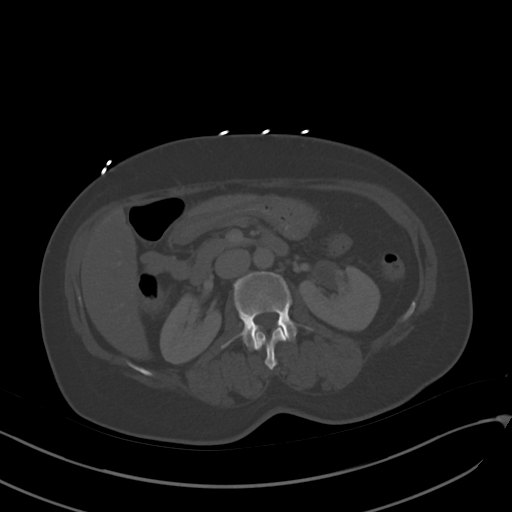
[im 56/82  soft-tissue]
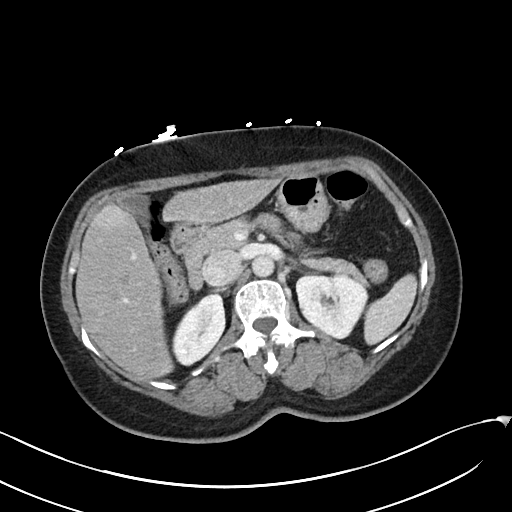
[im 66/82  soft-tissue]
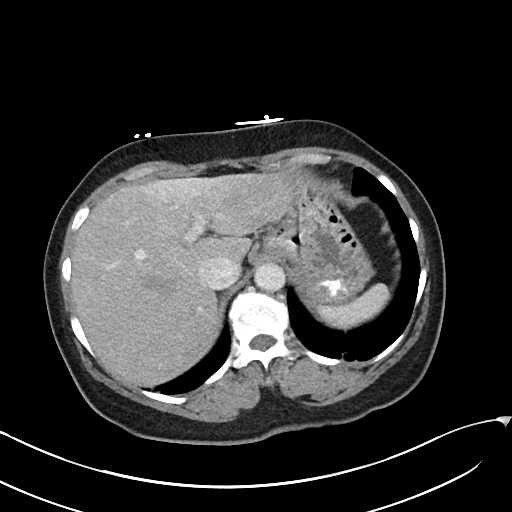
[im 71/82  soft-tissue]
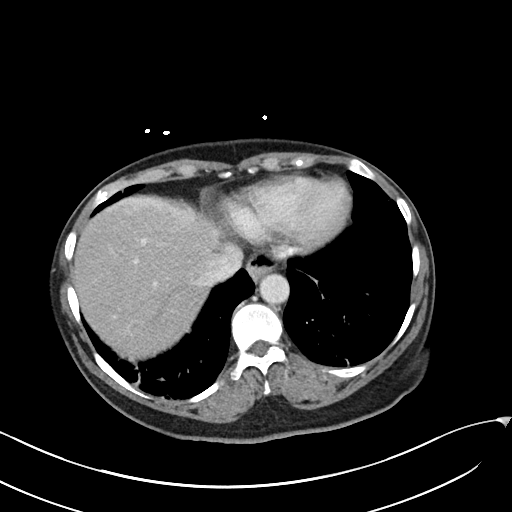
[im 76/82  soft-tissue]
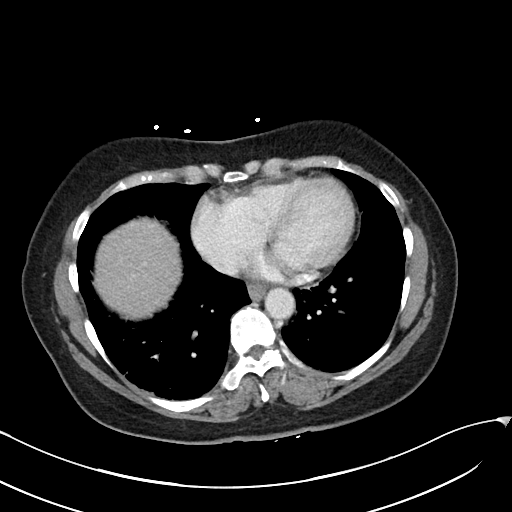

[Series 6: abdomen 3.0 mpr cor · coronal · 0.82mm/px · 3 of 80 slices shown]
[im 27/80  soft-tissue]
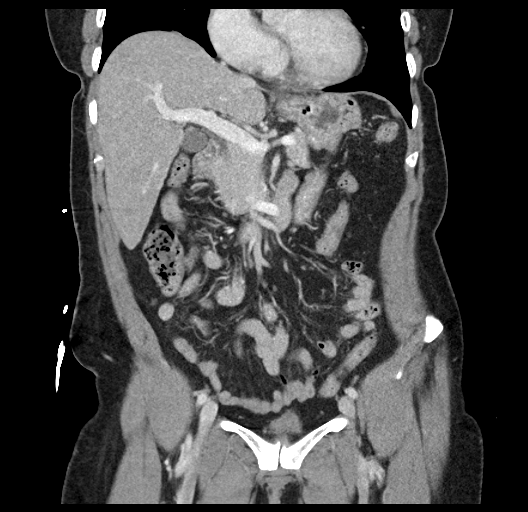
[im 36/80  soft-tissue]
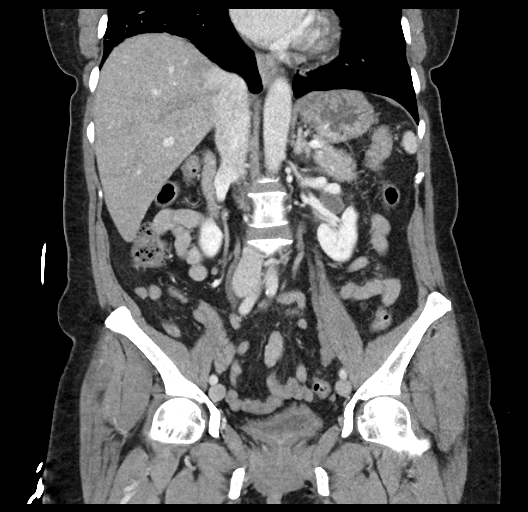
[im 44/80  soft-tissue]
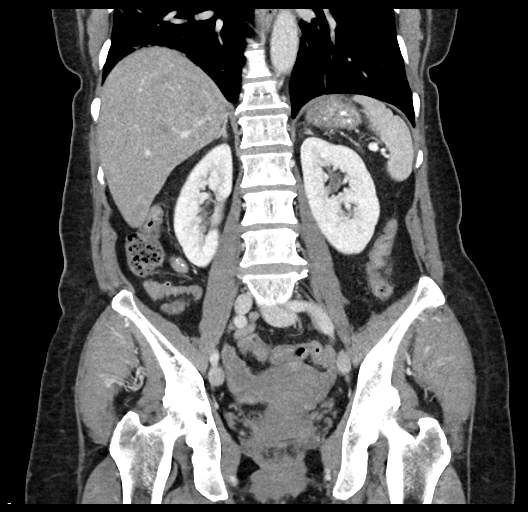

[16 of 46 positions shown; findings below may reference images not displayed]

FINDINGS: Lower chest: Mild atelectasis or scarring is noted at the lung
bases. The visualized portions of the mediastinum are unremarkable.

Hepatobiliary: There is fatty infiltration within the liver, with
mild sparing about the gallbladder fossa. The gallbladder is
unremarkable in appearance. The common bile duct remains caliber.

Pancreas: The pancreas is within normal limits.

Spleen: The spleen is unremarkable in appearance.

Adrenals/Urinary Tract: The adrenal glands are unremarkable in
appearance. Mild left renal pelvicaliectasis is noted, likely within
normal limits. There is no evidence of hydronephrosis. No renal or
ureteral stones are identified. No perinephric stranding is seen.

Stomach/Bowel: The stomach is unremarkable in appearance. The small
bowel is within normal limits. The appendix is normal in caliber,
without evidence of appendicitis. The colon is unremarkable in
appearance.

Vascular/Lymphatic: The abdominal aorta is unremarkable in
appearance. The inferior vena cava is grossly unremarkable. No
retroperitoneal lymphadenopathy is seen. No pelvic sidewall
lymphadenopathy is identified.

Reproductive: The bladder is decompressed. Bladder wall thickening
may reflect decompression or possibly cystitis. Small uterine
fibroids are noted. The ovaries are grossly symmetric. No suspicious
adnexal masses are seen.

Other: No additional soft tissue abnormalities are seen.

Musculoskeletal: No acute osseous abnormalities are identified. The
visualized musculature is unremarkable in appearance.
IMPRESSION: 1. Bladder wall thickening may simply reflect decompression, or
possibly cystitis.
2. Small uterine fibroids noted.
3. Fatty infiltration within the liver.

## 2018-07-21 ENCOUNTER — Ambulatory Visit
Admission: RE | Admit: 2018-07-21 | Discharge: 2018-07-21 | Disposition: A | Discharge status: Home / Self Care | Payer: Medicare PPO | Source: Ambulatory Visit | Attending: Gastroenterology | Admitting: Gastroenterology

## 2018-07-21 ENCOUNTER — Telehealth: Payer: Self-pay

## 2018-07-21 DIAGNOSIS — B181 Chronic viral hepatitis B without delta-agent: Secondary | ICD-10-CM | POA: Diagnosis not present

## 2018-07-21 DIAGNOSIS — B1910 Unspecified viral hepatitis B without hepatic coma: Secondary | ICD-10-CM

## 2018-07-21 DIAGNOSIS — R1084 Generalized abdominal pain: Secondary | ICD-10-CM

## 2018-07-21 NOTE — Telephone Encounter (Signed)
GXT Tech will not be in on 07/22/18 for her test, so we need to reschedule appt.

## 2018-07-29 ENCOUNTER — Ambulatory Visit: Payer: Medicare PPO | Admitting: Cardiology

## 2018-07-29 DIAGNOSIS — R0789 Other chest pain: Secondary | ICD-10-CM

## 2018-07-29 DIAGNOSIS — R9439 Abnormal result of other cardiovascular function study: Secondary | ICD-10-CM

## 2018-07-29 NOTE — Progress Notes (Signed)
Exercise Treadmill Stress Test 07/29/2018:  Indication: Chest pain The patient exercised on Bruce protocol for  07:05 min. Patient achieved  8.71 METS and reached HR  162 bpm, which is  104 % of maximum age-predicted HR.  Stress test terminated due to dizziness.   Exercise capacity was fair for age. HR Response to Exercise: Appropriate. BP Response to Exercise: Normal resting BP- appropriate response. Chest Pain: none. Arrhythmias: none. Resting EKG demonstrates Normal sinus rhythm. ST Changes: With peak exercise there were 1-2 mm horizontal ST depressions in leads V4-V6. EKG changes normalized 2 min into recovery.   Overall Impression:  Overall Impression: Positive stress test typical of ischemia. Recommendations: Consider further cardiac evaluation including cardiac catheterization if clinically indicated.  Discussed these results with the patient. In view of abnormal EKG changes on exercise, I recommend further workup with CTA coronary angiogram.  I will see her for follow up after the test.  Nigel Mormon, MD [facility] Pager: [phone] Office: [phone] If no answer Cell [phone]

## 2018-07-31 ENCOUNTER — Ambulatory Visit: Payer: Medicare HMO | Admitting: Internal Medicine

## 2018-08-10 NOTE — Telephone Encounter (Signed)
gxt done on 07/29/18

## 2018-08-20 DIAGNOSIS — J0110 Acute frontal sinusitis, unspecified: Secondary | ICD-10-CM | POA: Diagnosis not present

## 2018-08-20 DIAGNOSIS — J301 Allergic rhinitis due to pollen: Secondary | ICD-10-CM | POA: Diagnosis not present

## 2018-08-31 ENCOUNTER — Other Ambulatory Visit: Payer: Medicare PPO

## 2018-09-14 DIAGNOSIS — R69 Illness, unspecified: Secondary | ICD-10-CM | POA: Diagnosis not present

## 2018-09-14 DIAGNOSIS — E782 Mixed hyperlipidemia: Secondary | ICD-10-CM | POA: Diagnosis not present

## 2018-09-14 DIAGNOSIS — R05 Cough: Secondary | ICD-10-CM | POA: Diagnosis not present

## 2018-09-14 DIAGNOSIS — I10 Essential (primary) hypertension: Secondary | ICD-10-CM | POA: Diagnosis not present

## 2018-09-16 ENCOUNTER — Other Ambulatory Visit: Payer: Medicare PPO

## 2018-09-26 ENCOUNTER — Other Ambulatory Visit: Payer: Self-pay | Admitting: Internal Medicine

## 2018-09-26 DIAGNOSIS — I10 Essential (primary) hypertension: Secondary | ICD-10-CM

## 2018-09-28 NOTE — Telephone Encounter (Signed)
Patient last seen 03/2018. His PCP is Dr. Wynetta Emery but she is out of clinic this week. Will send to Dr. Margarita Rana for review.

## 2018-11-16 DIAGNOSIS — G8929 Other chronic pain: Secondary | ICD-10-CM | POA: Diagnosis not present

## 2018-11-16 DIAGNOSIS — E785 Hyperlipidemia, unspecified: Secondary | ICD-10-CM | POA: Diagnosis not present

## 2018-11-16 DIAGNOSIS — K5900 Constipation, unspecified: Secondary | ICD-10-CM | POA: Diagnosis not present

## 2018-11-16 DIAGNOSIS — Z7722 Contact with and (suspected) exposure to environmental tobacco smoke (acute) (chronic): Secondary | ICD-10-CM | POA: Diagnosis not present

## 2018-11-16 DIAGNOSIS — R69 Illness, unspecified: Secondary | ICD-10-CM | POA: Diagnosis not present

## 2018-11-16 DIAGNOSIS — K219 Gastro-esophageal reflux disease without esophagitis: Secondary | ICD-10-CM | POA: Diagnosis not present

## 2018-11-16 DIAGNOSIS — M792 Neuralgia and neuritis, unspecified: Secondary | ICD-10-CM | POA: Diagnosis not present

## 2018-11-16 DIAGNOSIS — M1990 Unspecified osteoarthritis, unspecified site: Secondary | ICD-10-CM | POA: Diagnosis not present

## 2018-11-16 DIAGNOSIS — I10 Essential (primary) hypertension: Secondary | ICD-10-CM | POA: Diagnosis not present

## 2018-11-19 ENCOUNTER — Other Ambulatory Visit: Payer: Medicare PPO

## 2018-12-09 DIAGNOSIS — Z20828 Contact with and (suspected) exposure to other viral communicable diseases: Secondary | ICD-10-CM | POA: Diagnosis not present

## 2018-12-10 ENCOUNTER — Other Ambulatory Visit: Payer: Self-pay | Admitting: Internal Medicine

## 2018-12-10 DIAGNOSIS — Z20828 Contact with and (suspected) exposure to other viral communicable diseases: Secondary | ICD-10-CM

## 2018-12-10 DIAGNOSIS — Z20822 Contact with and (suspected) exposure to covid-19: Secondary | ICD-10-CM

## 2018-12-14 ENCOUNTER — Other Ambulatory Visit: Payer: Medicare HMO

## 2019-02-17 DIAGNOSIS — I10 Essential (primary) hypertension: Secondary | ICD-10-CM | POA: Diagnosis not present

## 2019-02-17 DIAGNOSIS — E7800 Pure hypercholesterolemia, unspecified: Secondary | ICD-10-CM | POA: Diagnosis not present

## 2019-02-17 DIAGNOSIS — M25561 Pain in right knee: Secondary | ICD-10-CM | POA: Diagnosis not present

## 2019-02-17 DIAGNOSIS — Z23 Encounter for immunization: Secondary | ICD-10-CM | POA: Diagnosis not present

## 2019-02-18 DIAGNOSIS — M1711 Unilateral primary osteoarthritis, right knee: Secondary | ICD-10-CM | POA: Diagnosis not present

## 2019-02-18 DIAGNOSIS — M25561 Pain in right knee: Secondary | ICD-10-CM | POA: Diagnosis not present

## 2019-05-28 ENCOUNTER — Ambulatory Visit (HOSPITAL_BASED_OUTPATIENT_CLINIC_OR_DEPARTMENT_OTHER): Payer: Medicare Other | Admitting: Pharmacist

## 2019-05-28 ENCOUNTER — Ambulatory Visit: Payer: Medicare Other | Attending: Internal Medicine | Admitting: Internal Medicine

## 2019-05-28 ENCOUNTER — Other Ambulatory Visit: Payer: Self-pay

## 2019-05-28 ENCOUNTER — Encounter: Payer: Self-pay | Admitting: Internal Medicine

## 2019-05-28 VITALS — BP 128/82 | HR 73 | Resp 16 | Ht 62.0 in | Wt 156.8 lb

## 2019-05-28 DIAGNOSIS — I10 Essential (primary) hypertension: Secondary | ICD-10-CM

## 2019-05-28 DIAGNOSIS — Z23 Encounter for immunization: Secondary | ICD-10-CM | POA: Diagnosis not present

## 2019-05-28 DIAGNOSIS — K219 Gastro-esophageal reflux disease without esophagitis: Secondary | ICD-10-CM

## 2019-05-28 DIAGNOSIS — M170 Bilateral primary osteoarthritis of knee: Secondary | ICD-10-CM | POA: Diagnosis not present

## 2019-05-28 DIAGNOSIS — E785 Hyperlipidemia, unspecified: Secondary | ICD-10-CM

## 2019-05-28 MED ORDER — LOSARTAN POTASSIUM 25 MG PO TABS: 50.0000 mg | ORAL_TABLET | Freq: Every day | ORAL | 3 refills | Status: DC

## 2019-05-28 MED ORDER — AMLODIPINE BESYLATE 10 MG PO TABS: 10.0000 mg | ORAL_TABLET | Freq: Every day | ORAL | 3 refills | Status: DC

## 2019-05-28 MED ORDER — ATORVASTATIN CALCIUM 20 MG PO TABS: 20.0000 mg | ORAL_TABLET | Freq: Every day | ORAL | 3 refills | Status: DC

## 2019-05-28 MED ORDER — OMEPRAZOLE 40 MG PO CPDR: 40.0000 mg | DELAYED_RELEASE_CAPSULE | Freq: Every day | ORAL | 3 refills | Status: DC

## 2019-05-28 MED ORDER — DICLOFENAC SODIUM 1 % EX GEL: 2.0000 g | Freq: Four times a day (QID) | CUTANEOUS | 3 refills | Status: DC

## 2019-05-28 NOTE — Patient Instructions (Addendum)
Osteoarthritis  Osteoarthritis is a type of arthritis that affects tissue that covers the ends of bones in joints (cartilage). Cartilage acts as a cushion between the bones and helps them move smoothly. Osteoarthritis results when cartilage in the joints gets worn down. Osteoarthritis is sometimes called "wear and tear" arthritis. Osteoarthritis is the most common form of arthritis. It often occurs in older people. It is a condition that gets worse over time (a progressive condition). Joints that are most often affected by this condition are in:  Fingers.  Toes.  Hips.  Knees.  Spine, including neck and lower back. What are the causes? This condition is caused by age-related wearing down of cartilage that covers the ends of bones. What increases the risk? The following factors may make you more likely to develop this condition:  Older age.  Being overweight or obese.  Overuse of joints, such as in athletes.  Past injury of a joint.  Past surgery on a joint.  Family history of osteoarthritis. What are the signs or symptoms? The main symptoms of this condition are pain, swelling, and stiffness in the joint. The joint may lose its shape over time. Small pieces of bone or cartilage may break off and float inside of the joint, which may cause more pain and damage to the joint. Small deposits of bone (osteophytes) may grow on the edges of the joint. Other symptoms may include:  A grating or scraping feeling inside the joint when you move it.  Popping or creaking sounds when you move. Symptoms may affect one or more joints. Osteoarthritis in a major joint, such as your knee or hip, can make it painful to walk or exercise. If you have osteoarthritis in your hands, you might not be able to grip items, twist your hand, or control small movements of your hands and fingers (fine motor skills). How is this diagnosed? This condition may be diagnosed based on:  Your medical history.  A  physical exam.  Your symptoms.  X-rays of the affected joint(s).  Blood tests to rule out other types of arthritis. How is this treated? There is no cure for this condition, but treatment can help to control pain and improve joint function. Treatment plans may include:  A prescribed exercise program that allows for rest and joint relief. You may work with a physical therapist.  A weight control plan.  Pain relief techniques, such as: ? Applying heat and cold to the joint. ? Electric pulses delivered to nerve endings under the skin (transcutaneous electrical nerve stimulation, or TENS). ? Massage. ? Certain nutritional supplements.  NSAIDs or prescription medicines to help relieve pain.  Medicine to help relieve pain and inflammation (corticosteroids). This can be given by mouth (orally) or as an injection.  Assistive devices, such as a brace, wrap, splint, specialized glove, or cane.  Surgery, such as: ? An osteotomy. This is done to reposition the bones and relieve pain or to remove loose pieces of bone and cartilage. ? Joint replacement surgery. You may need this surgery if you have very bad (advanced) osteoarthritis. Follow these instructions at home: Activity  Rest your affected joints as directed by your health care provider.  Do not drive or use heavy machinery while taking prescription pain medicine.  Exercise as directed. Your health care provider or physical therapist may recommend specific types of exercise, such as: ? Strengthening exercises. These are done to strengthen the muscles that support joints that are affected by arthritis. They can be performed   with weights or with exercise bands to add resistance. ? Aerobic activities. These are exercises, such as brisk walking or water aerobics, that get your heart pumping. ? Range-of-motion activities. These keep your joints easy to move. ? Balance and agility exercises. Managing pain, stiffness, and swelling       If directed, apply heat to the affected area as often as told by your health care provider. Use the heat source that your health care provider recommends, such as a moist heat pack or a heating pad. ? If you have a removable assistive device, remove it as told by your health care provider. ? Place a towel between your skin and the heat source. If your health care provider tells you to keep the assistive device on while you apply heat, place a towel between the assistive device and the heat source. ? Leave the heat on for 20-30 minutes. ? Remove the heat if your skin turns bright red. This is especially important if you are unable to feel pain, heat, or cold. You may have a greater risk of getting burned.  If directed, put ice on the affected joint: ? If you have a removable assistive device, remove it as told by your health care provider. ? Put ice in a plastic bag. ? Place a towel between your skin and the bag. If your health care provider tells you to keep the assistive device on during icing, place a towel between the assistive device and the bag. ? Leave the ice on for 20 minutes, 2-3 times a day. General instructions  Take over-the-counter and prescription medicines only as told by your health care provider.  Maintain a healthy weight. Follow instructions from your health care provider for weight control. These may include dietary restrictions.  Do not use any products that contain nicotine or tobacco, such as cigarettes and e-cigarettes. These can delay bone healing. If you need help quitting, ask your health care provider.  Use assistive devices as directed by your health care provider.  Keep all follow-up visits as told by your health care provider. This is important. Where to find more information  [employer] of Arthritis and Musculoskeletal and Skin Diseases: www.niams.SouthExposed.es  [employer] on Aging: http://kim-miller.com/  American College of Rheumatology:  www.rheumatology.org Contact a health care provider if:  Your skin turns red.  You develop a rash.  You have pain that gets worse.  You have a fever along with joint or muscle aches. Get help right away if:  You lose a lot of weight.  You suddenly lose your appetite.  You have night sweats. Summary  Osteoarthritis is a type of arthritis that affects tissue covering the ends of bones in joints (cartilage).  This condition is caused by age-related wearing down of cartilage that covers the ends of bones.  The main symptom of this condition is pain, swelling, and stiffness in the joint.  There is no cure for this condition, but treatment can help to control pain and improve joint function. This information is not intended to replace advice given to you by your health care provider. Make sure you discuss any questions you have with your health care provider. Document Released: 05/27/2005 Document Revised: 05/09/2017 Document Reviewed: 01/29/2016 Elsevier Patient Education  [place].   Pneumococcal Polysaccharide Vaccine (PPSV23): What You Need to Know 1. Why get vaccinated? Pneumococcal polysaccharide vaccine (PPSV23) can prevent pneumococcal disease. Pneumococcal disease refers to any illness caused by pneumococcal bacteria. These bacteria can cause many types  of illnesses, including pneumonia, which is an infection of the lungs. Pneumococcal bacteria are one of the most common causes of pneumonia. Besides pneumonia, pneumococcal bacteria can also cause:  Ear infections  Sinus infections  Meningitis (infection of the tissue covering the brain and spinal cord)  Bacteremia (bloodstream infection) Anyone can get pneumococcal disease, but children under 69 years of age, people with certain medical conditions, adults 108 years or older, and cigarette smokers are at the highest risk. Most pneumococcal infections are mild. However, some can result in long-term problems, such  as brain damage or hearing loss. Meningitis, bacteremia, and pneumonia caused by pneumococcal disease can be fatal. 2. PPSV23 PPSV23 protects against 23 types of bacteria that cause pneumococcal disease. PPSV23 is recommended for:  All adults 60 years or older,  Anyone 2 years or older with certain medical conditions that can lead to an increased risk for pneumococcal disease. Most people need only one dose of PPSV23. A second dose of PPSV23, and another type of pneumococcal vaccine called PCV13, are recommended for certain high-risk groups. Your health care provider can give you more information. People 65 years or older should get a dose of PPSV23 even if they have already gotten one or more doses of the vaccine before they turned 36. 3. Talk with your health care provider Tell your vaccine provider if the person getting the vaccine:  Has had an allergic reaction after a previous dose of PPSV23, or has any severe, life-threatening allergies. In some cases, your health care provider may decide to postpone PPSV23 vaccination to a future visit. People with minor illnesses, such as a cold, may be vaccinated. People who are moderately or severely ill should usually wait until they recover before getting PPSV23. Your health care provider can give you more information. 4. Risks of a vaccine reaction  Redness or pain where the shot is given, feeling tired, fever, or muscle aches can happen after PPSV23. People sometimes faint after medical procedures, including vaccination. Tell your provider if you feel dizzy or have vision changes or ringing in the ears. As with any medicine, there is a very remote chance of a vaccine causing a severe allergic reaction, other serious injury, or death. 5. What if there is a serious problem? An allergic reaction could occur after the vaccinated person leaves the clinic. If you see signs of a severe allergic reaction (hives, swelling of the face and throat,  difficulty breathing, a fast heartbeat, dizziness, or weakness), call 9-1-1 and get the person to the nearest hospital. For other signs that concern you, call your health care provider. Adverse reactions should be reported to the Vaccine Adverse Event Reporting System (VAERS). Your health care provider will usually file this report, or you can do it yourself. Visit the VAERS website at www.vaers.SamedayNews.es or call 5796666075. VAERS is only for reporting reactions, and VAERS staff do not give medical advice. 6. How can I learn more?  Ask your health care provider.  Call your local or state health department.  Contact the Centers for Disease Control and Prevention (CDC): ? Call 330-523-3297 (1-800-CDC-INFO) or ? Visit CDC's website at http://hunter.com/ CDC Vaccine Information Statement PPSV23 Vaccine (04/08/2018) This information is not intended to replace advice given to you by your health care provider. Make sure you discuss any questions you have with your health care provider. Document Released: 03/24/2006 Document Revised: 09/15/2018 Document Reviewed: 01/06/2018 Elsevier Patient Education  2020 [employer].

## 2019-05-28 NOTE — Progress Notes (Signed)
Patient presents for vaccination against strep pneumo per orders of Dr. Johnson. Consent given. Counseling provided. No contraindications exists. Vaccine administered without incident.   

## 2019-05-28 NOTE — Progress Notes (Signed)
Patient ID: Jacqueline Orozco, female    DOB: 25-Nov-1950  MRN: [id number]  CC: Hypertension   Subjective: Jacqueline Orozco is a 68 y.o. female who presents for chronic ds management Her concerns today include:  Hx of Chronic LBPdue to disc ds, HTN, HL, L hydronephrosis s/p pyeloplasty 06/2016, chronic hepB,Migraines.  Last seen 01/2019  Pt wih pain BL knees.  Saw Dr. Berenice Primas with [facility] a few times over the past several months for the same.  I cannot see his note but I do see his diagnosis of OA.  She had steroid inj both knees which helped.  She tells me that she also uses a old home remedy of combining onions with garlic, heated in the microwave and then rubbing it on the knees.  She is not had any falls.  HTN: Reports compliance with blood pressure medications Norvasc and Cozaar.  She is requesting refills.  She limits salt in the foods.  No chest pains or shortness of breath.  Reports compliance with Lipitor.  She is tolerating it well. Patient Active Problem List   Diagnosis Date Noted  . Motion sickness 08/26/2017  . Meniere disease, left 07/25/2017  . 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 Anterolisthesis 12/24/2016  . Allergic contact dermatitis due to adhesives 07/02/2016  . Hydronephrosis with ureteropelvic junction (UPJ) obstruction 04/15/2016  . Osteoarthritis of right wrist 07/28/2015  . Gastric and duodenal angiodysplasia   . Seasonal allergies 09/15/2014  . IBS (irritable bowel syndrome) 06/20/2014  . Lumbar disc disease 09/29/2013  . Gastric AVM 12/29/2012  . Hx of adenomatous colonic polyps 10/16/2012  . Plantar fasciitis, right 05/19/2012  . Cervical radiculitis 02/12/2012  . Vertigo 05/22/2011  . HEPATITIS B, CHRONIC 03/28/2010  . HLD (hyperlipidemia) 03/28/2010  . Essential hypertension 03/28/2010     Current Outpatient Medications on File Prior to Visit  Medication Sig Dispense Refill  . acetaminophen (TYLENOL 8 HOUR) 650 MG CR tablet Take 1 tablet (650 mg  total) by mouth every 8 (eight) hours as needed for pain. 90 tablet 1  . Calcium Citrate 250 MG TABS Take 2 tablets (500 mg total) by mouth daily. 60 tablet 11  . calcium citrate-vitamin D 500-400 MG-UNIT chewable tablet Chew 1 tablet by mouth 2 (two) times daily. 180 tablet 1  . cetirizine (ZYRTEC) 10 MG tablet Take 1 tablet (10 mg total) by mouth daily. 30 tablet 2  . diclofenac sodium (VOLTAREN) 1 % GEL Apply 2 g topically 4 (four) times daily. 100 g 0  . dicyclomine (BENTYL) 20 MG tablet TAKE 1 TABLET BY MOUTH 3 TIMES DAILY BEFORE MEALS.  Needs to be seen by PCP prior to next RF request. 90 tablet 0  . methocarbamol (ROBAXIN) 500 MG tablet Take 1 tablet (500 mg total) by mouth 2 (two) times daily as needed for muscle spasms. 30 tablet 0  . polyethylene glycol powder (GLYCOLAX/MIRALAX) powder Take 17 grams PO PRN 255 g 4   No current facility-administered medications on file prior to visit.    Allergies  Allergen Reactions  . Aspirin Other (See Comments)    stomach pain, stomach bleeding  . Penicillins Nausea And Vomiting and Other (See Comments)    Dizzy Has patient had a PCN reaction causing immediate rash, facial/tongue/throat swelling, SOB or lightheadedness with hypotension: No Has patient had a PCN reaction causing severe rash involving mucus membranes or skin necrosis: No Has patient had a PCN reaction that required hospitalization; No Has patient had a PCN reaction occurring  within the last 10 years: No If all of the above answers are "NO", then may proceed with Cephalosporin use.   . Latex Itching  . Streptomycin Nausea And Vomiting and Rash  . Tramadol Nausea Only    Social History   Socioeconomic History  . Marital status: Married    Spouse name: Not on file  . Number of children: 6  . Years of education: 30   . Highest education level: Not on file  Occupational History  . Occupation: Unemployed   Tobacco Use  . Smoking status: Never Smoker  . Smokeless tobacco:  Never Used  Substance and Sexual Activity  . Alcohol use: Yes    Comment: occasional wine  . Drug use: No  . Sexual activity: Yes    Birth control/protection: None  Other Topics Concern  . Not on file  Social History Narrative   From [place].   Lived in [place] since 1994.    Live with husband.   6 adult children.    Speaks some English and reads some  [place].    Caffeine use: Coffee daily   Right handed   Social Determinants of Health   Financial Resource Strain:   . Difficulty of Paying Living Expenses: Not on file  Food Insecurity:   . Worried About Charity fundraiser in the Last Year: Not on file  . Ran Out of Food in the Last Year: Not on file  Transportation Needs:   . Lack of Transportation (Medical): Not on file  . Lack of Transportation (Non-Medical): Not on file  Physical Activity:   . Days of Exercise per Week: Not on file  . Minutes of Exercise per Session: Not on file  Stress:   . Feeling of Stress : Not on file  Social Connections:   . Frequency of Communication with Friends and Family: Not on file  . Frequency of Social Gatherings with Friends and Family: Not on file  . Attends Religious Services: Not on file  . Active Member of Clubs or Organizations: Not on file  . Attends Archivist Meetings: Not on file  . Marital Status: Not on file  Intimate Partner Violence:   . Fear of Current or Ex-Partner: Not on file  . Emotionally Abused: Not on file  . Physically Abused: Not on file  . Sexually Abused: Not on file    Family History  Problem Relation Age of Onset  . Hypertension Mother   . Stomach cancer Father   . Liver disease Maternal Uncle   . Lung cancer Maternal Grandmother   . Breast cancer Neg Hx     Past Surgical History:  Procedure Laterality Date  . COLONOSCOPY WITH PROPOFOL N/A 01/03/2015   Procedure: COLONOSCOPY WITH PROPOFOL;  Surgeon: Jerene Bears, MD;  Location: [facility];  Service: Gastroenterology;  Laterality: N/A;  .  CYSTOSCOPY W/ URETERAL STENT PLACEMENT Left 06/17/2016   Procedure: CYSTOSCOPY WITH RETROGRADE PYELOGRAM/URETERAL STENT PLACEMENT;  Surgeon: Raynelle Bring, MD;  Location: [facility];  Service: Urology;  Laterality: Left;  . ESOPHAGOGASTRODUODENOSCOPY (EGD) WITH PROPOFOL N/A 01/03/2015   Procedure: ESOPHAGOGASTRODUODENOSCOPY (EGD) WITH PROPOFOL;  Surgeon: Jerene Bears, MD;  Location: [facility];  Service: Gastroenterology;  Laterality: N/A;  . ESOPHAGOGASTRODUODENOSCOPY ENDOSCOPY     several times  . HOT HEMOSTASIS N/A 01/03/2015   Procedure: HOT HEMOSTASIS (ARGON PLASMA COAGULATION/BICAP);  Surgeon: Jerene Bears, MD;  Location: [facility];  Service: Gastroenterology;  Laterality: N/A;  . NO PAST SURGERIES    .  ROBOT ASSISTED PYELOPLASTY Left 06/17/2016   Procedure: XI ROBOTIC ASSISTED PYELOPLASTY;  Surgeon: Raynelle Bring, MD;  Location: [facility];  Service: Urology;  Laterality: Left;    ROS: Review of Systems Negative except as stated above  PHYSICAL EXAM: BP 128/82   Pulse 73   Resp 16   Ht 5\' 2"  (1.575 m)   Wt 156 lb 12.8 oz (71.1 kg)   SpO2 98%   BMI 28.68 kg/m   Physical Exam  General appearance - alert, well appearing, and in no distress Mouth - mucous membranes moist, pharynx normal without lesions Neck - supple, no significant adenopathy Chest - clear to auscultation, no wheezes, rales or rhonchi, symmetric air entry Heart - normal rate, regular rhythm, soft systolic murmur along the left sternal border.   Musculoskeletal -knees: Mild enlargement of both joints.  No erythema or significant edema.  Slow passive range of motion. Extremities -no lower extremity edema.   CMP Latest Ref Rng & Units 07/25/2017 07/16/2017 07/15/2017  Glucose 65 - 99 mg/dL - 116(H) 137(H)  BUN 6 - 20 mg/dL - 13 11  Creatinine 0.44 - 1.00 mg/dL - 0.50 0.77  Sodium 135 - 145 mmol/L - 140 138  Potassium 3.5 - 5.2 mmol/L 3.6 3.4(L) 2.3(LL)  Chloride 101 - 111 mmol/L - 102 101  CO2 22 - 32 mmol/L - - 24    Calcium 8.9 - 10.3 mg/dL - - 9.6  Total Protein 6.5 - 8.1 g/dL - - 8.3(H)  Total Bilirubin 0.3 - 1.2 mg/dL - - 0.7  Alkaline Phos 38 - 126 U/L - - 72  AST 15 - 41 U/L - - 28  ALT 14 - 54 U/L - - 22   Lipid Panel     Component Value Date/Time   CHOL 183 11/23/2015 1203   TRIG 119 11/23/2015 1203   HDL 67 11/23/2015 1203   CHOLHDL 2.7 11/23/2015 1203   VLDL 24 11/23/2015 1203   LDLCALC 92 11/23/2015 1203   LDLDIRECT 141.5 03/27/2010 0953    CBC    Component Value Date/Time   WBC 4.1 10/02/2017 1128   WBC 8.0 07/15/2017 1805   RBC 4.99 10/02/2017 1128   RBC 5.04 07/15/2017 1805   HGB 13.1 10/02/2017 1128   HCT 40.9 10/02/2017 1128   PLT 169 10/02/2017 1128   MCV 82 10/02/2017 1128   MCH 26.3 (L) 10/02/2017 1128   MCH 26.4 07/15/2017 1805   MCHC 32.0 10/02/2017 1128   MCHC 33.3 07/15/2017 1805   RDW 14.9 10/02/2017 1128   LYMPHSABS 2.1 10/02/2017 1128   MONOABS 0.3 01/19/2017 1615   EOSABS 0.2 10/02/2017 1128   BASOSABS 0.0 10/02/2017 1128    ASSESSMENT AND PLAN: 1. Essential hypertension Close to goal.  Continue current medications and low-salt diet - amLODipine (NORVASC) 10 MG tablet; Take 1 tablet (10 mg total) by mouth daily.  Dispense: 90 tablet; Refill: 3 - losartan (COZAAR) 25 MG tablet; Take 2 tablets (50 mg total) by mouth daily.  Dispense: 180 tablet; Refill: 3 - CBC - Comprehensive metabolic panel  2. Primary osteoarthritis of both knees I would like for her to try the Voltaren gel on her knees.  She is agreeable to doing so.  Encouraged her to stay active - diclofenac Sodium (VOLTAREN) 1 % GEL; Apply 2 g topically 4 (four) times daily.  Dispense: 100 g; Refill: 3  3. 23-polyvalent pneumococcal polysaccharide vaccine  Given  4. Hyperlipidemia, unspecified hyperlipidemia type - atorvastatin (LIPITOR) 20 MG tablet;  Take 1 tablet (20 mg total) by mouth daily.  Dispense: 90 tablet; Refill: 3 - Lipid panel  5. Gastroesophageal reflux disease without  esophagitis Patient requested refill on Prilosec - omeprazole (PRILOSEC) 40 MG capsule; Take 1 capsule (40 mg total) by mouth daily.  Dispense: 90 capsule; Refill: 3   Patient was given the opportunity to ask questions.  Patient verbalized understanding of the plan and was able to repeat key elements of the plan.   Orders Placed This Encounter  Procedures  . CBC  . Comprehensive metabolic panel  . Lipid panel     Requested Prescriptions   Signed Prescriptions Disp Refills  . amLODipine (NORVASC) 10 MG tablet 90 tablet 3    Sig: Take 1 tablet (10 mg total) by mouth daily.  Marland Kitchen losartan (COZAAR) 25 MG tablet 180 tablet 3    Sig: Take 2 tablets (50 mg total) by mouth daily.  Marland Kitchen atorvastatin (LIPITOR) 20 MG tablet 90 tablet 3    Sig: Take 1 tablet (20 mg total) by mouth daily.  Marland Kitchen omeprazole (PRILOSEC) 40 MG capsule 90 capsule 3    Sig: Take 1 capsule (40 mg total) by mouth daily.  . diclofenac Sodium (VOLTAREN) 1 % GEL 100 g 3    Sig: Apply 2 g topically 4 (four) times daily.    Return in about 4 months (around 09/26/2019).  Karle Plumber, MD, FACP

## 2019-05-29 LAB — CBC
Hematocrit: 39.6 % (ref 34.0–46.6)
Hemoglobin: 13.1 g/dL (ref 11.1–15.9)
MCH: 27.1 pg (ref 26.6–33.0)
MCHC: 33.1 g/dL (ref 31.5–35.7)
MCV: 82 fL (ref 79–97)
Platelets: 206 10*3/uL (ref 150–450)
RBC: 4.83 x10E6/uL (ref 3.77–5.28)
RDW: 13.5 % (ref 11.7–15.4)
WBC: 6.9 10*3/uL (ref 3.4–10.8)

## 2019-05-29 LAB — LIPID PANEL
Chol/HDL Ratio: 2.9 ratio (ref 0.0–4.4)
Cholesterol, Total: 222 mg/dL — ABNORMAL HIGH (ref 100–199)
HDL: 76 mg/dL (ref >39)
LDL Chol Calc (NIH): 117 mg/dL — ABNORMAL HIGH (ref 0–99)
Triglycerides: 167 mg/dL — ABNORMAL HIGH (ref 0–149)
VLDL Cholesterol Cal: 29 mg/dL (ref 5–40)

## 2019-05-29 LAB — COMPREHENSIVE METABOLIC PANEL
ALT: 17 IU/L (ref 0–32)
AST: 16 IU/L (ref 0–40)
Albumin/Globulin Ratio: 1.6 (ref 1.2–2.2)
Albumin: 4.4 g/dL (ref 3.8–4.8)
Alkaline Phosphatase: 84 IU/L (ref 39–117)
BUN/Creatinine Ratio: 25 (ref 12–28)
BUN: 15 mg/dL (ref 8–27)
Bilirubin Total: 0.4 mg/dL (ref 0.0–1.2)
CO2: 23 mmol/L (ref 20–29)
Calcium: 9.5 mg/dL (ref 8.7–10.3)
Chloride: 103 mmol/L (ref 96–106)
Creatinine, Ser: 0.6 mg/dL (ref 0.57–1.00)
GFR calc Af Amer: 110 mL/min/{1.73_m2} (ref >59)
GFR calc non Af Amer: 95 mL/min/{1.73_m2} (ref >59)
Globulin, Total: 2.8 g/dL (ref 1.5–4.5)
Glucose: 94 mg/dL (ref 65–99)
Potassium: 3.8 mmol/L (ref 3.5–5.2)
Sodium: 140 mmol/L (ref 134–144)
Total Protein: 7.2 g/dL (ref 6.0–8.5)

## 2019-05-31 ENCOUNTER — Telehealth: Payer: Self-pay

## 2019-05-31 NOTE — Telephone Encounter (Signed)
Contacted pt to go over lab results pt is aware and doesn't have any questions or concerns 

## 2019-07-01 ENCOUNTER — Other Ambulatory Visit: Payer: Self-pay | Admitting: Internal Medicine

## 2019-07-01 DIAGNOSIS — Z1231 Encounter for screening mammogram for malignant neoplasm of breast: Secondary | ICD-10-CM

## 2019-08-06 ENCOUNTER — Ambulatory Visit
Admission: RE | Admit: 2019-08-06 | Discharge: 2019-08-06 | Disposition: A | Discharge status: Home / Self Care | Payer: Medicare HMO | Source: Ambulatory Visit | Attending: Internal Medicine | Admitting: Internal Medicine

## 2019-08-06 ENCOUNTER — Other Ambulatory Visit: Payer: Self-pay

## 2019-08-06 DIAGNOSIS — Z1231 Encounter for screening mammogram for malignant neoplasm of breast: Secondary | ICD-10-CM

## 2019-09-24 ENCOUNTER — Telehealth: Payer: Self-pay

## 2019-09-24 NOTE — Telephone Encounter (Signed)
Voltaren/diclofenac gel prior Jacqueline Orozco has been denied thru pt's ins-purchase OTC

## 2019-09-30 ENCOUNTER — Telehealth: Payer: Self-pay

## 2019-09-30 NOTE — Telephone Encounter (Signed)
Pt is aware that she will have to purchase Voltaren gel/diclofenac and the Zyrtec/cetirizine over the counter.  Both items are not covered under her ins.  Spoke to pt via phone, verifying DOB, on 09/30/2019 9:10am.

## 2019-11-02 ENCOUNTER — Encounter: Payer: Self-pay | Admitting: Internal Medicine

## 2019-11-02 ENCOUNTER — Ambulatory Visit: Payer: Medicare HMO | Attending: Internal Medicine | Admitting: Internal Medicine

## 2019-11-02 ENCOUNTER — Other Ambulatory Visit: Payer: Self-pay

## 2019-11-02 VITALS — BP 147/87 | HR 64 | Temp 97.3°F | Resp 16 | Wt 154.6 lb

## 2019-11-02 DIAGNOSIS — E785 Hyperlipidemia, unspecified: Secondary | ICD-10-CM

## 2019-11-02 DIAGNOSIS — I10 Essential (primary) hypertension: Secondary | ICD-10-CM

## 2019-11-02 DIAGNOSIS — Z780 Asymptomatic menopausal state: Secondary | ICD-10-CM | POA: Diagnosis not present

## 2019-11-02 DIAGNOSIS — R1031 Right lower quadrant pain: Secondary | ICD-10-CM

## 2019-11-02 MED ORDER — LOSARTAN POTASSIUM 50 MG PO TABS: 75.0000 mg | ORAL_TABLET | Freq: Every day | ORAL | 6 refills | Status: DC

## 2019-11-02 MED ORDER — LOSARTAN POTASSIUM 50 MG PO TABS: 50.0000 mg | ORAL_TABLET | Freq: Every day | ORAL | 6 refills | Status: DC

## 2019-11-02 NOTE — Patient Instructions (Signed)
You can purchase and use some MiraLAX as needed for constipation.  Try to drink several glasses of water daily.  Try to incorporate fresh fruits and vegetables into your diet.  Your blood pressure is not at goal.  We have increased the Cozaar to 75 mg daily.  I have sent a prescription to your pharmacy for the 50 mg tablets.  You will take 1-1/2 tablet daily to equal 75 mg.

## 2019-11-02 NOTE — Progress Notes (Signed)
Patient ID: Jacqueline Orozco, female    DOB: [birth year]  MRN: [id number]  CC: Abdominal Pain and Hypertension   Subjective: Jacqueline Orozco is a 69 y.o. female who presents for chronic ds management.  Y Hin, from [employer], is with her and interprets Her concerns today include:  Hx of Chronic LBPdue to disc ds, HTN, HL, L hydronephrosis s/p pyeloplasty 06/2016, chronic hepB,Migraines.  C/o pain RT lower abdomen radiating to her back x 2 wks Pain intermittent and varies in intensity.  No associated nausea/vomiting/fever or dysuria.  Bowel movements were hard and she was having to strain.  She noticed small amount of blood when she wiped during the initial 2 days.  Started after she had not had a bowel movement for several days.  She normally has a bowel movement twice a day.  Reports being given a medication to regulate the bowel called Biotill by a friend on 10/21/2019.  Took twice with good results.  Feeling good today.  HTN:  Took meds already fro today.  She is on Norvasc and Cozaar 50 mg daily.  Limits salt in foods No device to check BP No chest pains or shortness of breath.  No lower extremity edema.  HL:  Taking and tolerating Lipitor  HM:  Has not received COVID vaccine as yet.  Had Shingles (not sure if Shingrix) vaccine April 21 and will have COVID vaccine June 21  Patient Active Problem List   Diagnosis Date Noted  . Primary osteoarthritis of both knees 05/28/2019  . Motion sickness 08/26/2017  . Meniere disease, left 07/25/2017  . 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 Anterolisthesis 12/24/2016  . Allergic contact dermatitis due to adhesives 07/02/2016  . Hydronephrosis with ureteropelvic junction (UPJ) obstruction 04/15/2016  . Osteoarthritis of right wrist 07/28/2015  . Gastric and duodenal angiodysplasia   . Seasonal allergies 09/15/2014  . IBS (irritable bowel syndrome) 06/20/2014  . Lumbar disc disease 09/29/2013  . Gastric AVM 12/29/2012  . Hx of adenomatous colonic  polyps 10/16/2012  . Plantar fasciitis, right 05/19/2012  . Cervical radiculitis 02/12/2012  . Vertigo 05/22/2011  . HEPATITIS B, CHRONIC 03/28/2010  . HLD (hyperlipidemia) 03/28/2010  . Essential hypertension 03/28/2010     Current Outpatient Medications on File Prior to Visit  Medication Sig Dispense Refill  . acetaminophen (TYLENOL 8 HOUR) 650 MG CR tablet Take 1 tablet (650 mg total) by mouth every 8 (eight) hours as needed for pain. 90 tablet 1  . amLODipine (NORVASC) 10 MG tablet Take 1 tablet (10 mg total) by mouth daily. 90 tablet 3  . atorvastatin (LIPITOR) 20 MG tablet Take 1 tablet (20 mg total) by mouth daily. 90 tablet 3  . Calcium Citrate 250 MG TABS Take 2 tablets (500 mg total) by mouth daily. 60 tablet 11  . calcium citrate-vitamin D 500-400 MG-UNIT chewable tablet Chew 1 tablet by mouth 2 (two) times daily. 180 tablet 1  . cetirizine (ZYRTEC) 10 MG tablet Take 1 tablet (10 mg total) by mouth daily. 30 tablet 2  . diclofenac sodium (VOLTAREN) 1 % GEL Apply 2 g topically 4 (four) times daily. 100 g 0  . diclofenac Sodium (VOLTAREN) 1 % GEL Apply 2 g topically 4 (four) times daily. 100 g 3  . dicyclomine (BENTYL) 20 MG tablet TAKE 1 TABLET BY MOUTH 3 TIMES DAILY BEFORE MEALS.  Needs to be seen by PCP prior to next RF request. 90 tablet 0  . losartan (COZAAR) 25 MG tablet Take 2 tablets (  50 mg total) by mouth daily. 180 tablet 3  . methocarbamol (ROBAXIN) 500 MG tablet Take 1 tablet (500 mg total) by mouth 2 (two) times daily as needed for muscle spasms. 30 tablet 0  . omeprazole (PRILOSEC) 40 MG capsule Take 1 capsule (40 mg total) by mouth daily. 90 capsule 3  . polyethylene glycol powder (GLYCOLAX/MIRALAX) powder Take 17 grams PO PRN 255 g 4   No current facility-administered medications on file prior to visit.    Allergies  Allergen Reactions  . Aspirin Other (See Comments)    stomach pain, stomach bleeding  . Penicillins Nausea And Vomiting and Other (See Comments)      Dizzy Has patient had a PCN reaction causing immediate rash, facial/tongue/throat swelling, SOB or lightheadedness with hypotension: No Has patient had a PCN reaction causing severe rash involving mucus membranes or skin necrosis: No Has patient had a PCN reaction that required hospitalization; No Has patient had a PCN reaction occurring within the last 10 years: No If all of the above answers are "NO", then may proceed with Cephalosporin use.   . Latex Itching  . Streptomycin Nausea And Vomiting and Rash  . Tramadol Nausea Only    Social History   Socioeconomic History  . Marital status: Married    Spouse name: Not on file  . Number of children: 6  . Years of education: 24   . Highest education level: Not on file  Occupational History  . Occupation: Unemployed   Tobacco Use  . Smoking status: Never Smoker  . Smokeless tobacco: Never Used  Substance and Sexual Activity  . Alcohol use: Yes    Comment: occasional wine  . Drug use: No  . Sexual activity: Yes    Birth control/protection: None  Other Topics Concern  . Not on file  Social History Narrative   From [place].   Lived in [place] since 1994.    Live with husband.   6 adult children.    Speaks some English and reads some  [place].    Caffeine use: Coffee daily   Right handed   Social Determinants of Health   Financial Resource Strain:   . Difficulty of Paying Living Expenses:   Food Insecurity:   . Worried About Charity fundraiser in the Last Year:   . Arboriculturist in the Last Year:   Transportation Needs:   . Film/video editor (Medical):   Marland Kitchen Lack of Transportation (Non-Medical):   Physical Activity:   . Days of Exercise per Week:   . Minutes of Exercise per Session:   Stress:   . Feeling of Stress :   Social Connections:   . Frequency of Communication with Friends and Family:   . Frequency of Social Gatherings with Friends and Family:   . Attends Religious Services:   . Active Member of Clubs  or Organizations:   . Attends Archivist Meetings:   Marland Kitchen Marital Status:   Intimate Partner Violence:   . Fear of Current or Ex-Partner:   . Emotionally Abused:   Marland Kitchen Physically Abused:   . Sexually Abused:     Family History  Problem Relation Age of Onset  . Hypertension Mother   . Stomach cancer Father   . Liver disease Maternal Uncle   . Lung cancer Maternal Grandmother   . Breast cancer Neg Hx     Past Surgical History:  Procedure Laterality Date  . COLONOSCOPY WITH PROPOFOL N/A 01/03/2015  Procedure: COLONOSCOPY WITH PROPOFOL;  Surgeon: Jerene Bears, MD;  Location: [facility];  Service: Gastroenterology;  Laterality: N/A;  . CYSTOSCOPY W/ URETERAL STENT PLACEMENT Left 06/17/2016   Procedure: CYSTOSCOPY WITH RETROGRADE PYELOGRAM/URETERAL STENT PLACEMENT;  Surgeon: Raynelle Bring, MD;  Location: [facility];  Service: Urology;  Laterality: Left;  . ESOPHAGOGASTRODUODENOSCOPY (EGD) WITH PROPOFOL N/A 01/03/2015   Procedure: ESOPHAGOGASTRODUODENOSCOPY (EGD) WITH PROPOFOL;  Surgeon: Jerene Bears, MD;  Location: [facility];  Service: Gastroenterology;  Laterality: N/A;  . ESOPHAGOGASTRODUODENOSCOPY ENDOSCOPY     several times  . HOT HEMOSTASIS N/A 01/03/2015   Procedure: HOT HEMOSTASIS (ARGON PLASMA COAGULATION/BICAP);  Surgeon: Jerene Bears, MD;  Location: [facility];  Service: Gastroenterology;  Laterality: N/A;  . NO PAST SURGERIES    . ROBOT ASSISTED PYELOPLASTY Left 06/17/2016   Procedure: XI ROBOTIC ASSISTED PYELOPLASTY;  Surgeon: Raynelle Bring, MD;  Location: [facility];  Service: Urology;  Laterality: Left;    ROS: Review of Systems Negative except as stated above  PHYSICAL EXAM: BP (!) 147/87   Pulse 64   Temp (!) 97.3 F (36.3 C)   Resp 16   Wt 154 lb 9.6 oz (70.1 kg)   SpO2 98%   BMI 28.28 kg/m   Wt Readings from Last 3 Encounters:  11/02/19 154 lb 9.6 oz (70.1 kg)  05/28/19 156 lb 12.8 oz (71.1 kg)  03/30/18 155 lb (70.3 kg)  BP 142/80  Physical  Exam  General appearance - alert, well appearing, and in no distress Mental status - normal mood, behavior, speech, dress, motor activity, and thought processes Neck - supple, no significant adenopathy Chest - clear to auscultation, no wheezes, rales or rhonchi, symmetric air entry Heart - normal rate, regular rhythm, normal S1, S2, no murmurs, rubs, clicks or gallops Abdomen - soft, nontender, no guarding or rebound, nondistended, no masses or organomegaly Rectal: CMA Pollock present-no external hemorrhoids noted.  She has brown stools in the rectal vault.  No abnormal masses palpated.  Stool is heme-negative. Extremities - peripheral pulses normal, no pedal edema, no clubbing or cyanosis   CMP Latest Ref Rng & Units 05/28/2019 07/25/2017 07/16/2017  Glucose 65 - 99 mg/dL 94 - 116(H)  BUN 8 - 27 mg/dL 15 - 13  Creatinine 0.57 - 1.00 mg/dL 0.60 - 0.50  Sodium 134 - 144 mmol/L 140 - 140  Potassium 3.5 - 5.2 mmol/L 3.8 3.6 3.4(L)  Chloride 96 - 106 mmol/L 103 - 102  CO2 20 - 29 mmol/L 23 - -  Calcium 8.7 - 10.3 mg/dL 9.5 - -  Total Protein 6.0 - 8.5 g/dL 7.2 - -  Total Bilirubin 0.0 - 1.2 mg/dL 0.4 - -  Alkaline Phos 39 - 117 IU/L 84 - -  AST 0 - 40 IU/L 16 - -  ALT 0 - 32 IU/L 17 - -   Lipid Panel     Component Value Date/Time   CHOL 222 (H) 05/28/2019 1053   TRIG 167 (H) 05/28/2019 1053   HDL 76 05/28/2019 1053   CHOLHDL 2.9 05/28/2019 1053   CHOLHDL 2.7 11/23/2015 1203   VLDL 24 11/23/2015 1203   LDLCALC 117 (H) 05/28/2019 1053   LDLDIRECT 141.5 03/27/2010 0953    CBC    Component Value Date/Time   WBC 6.9 05/28/2019 1053   WBC 8.0 07/15/2017 1805   RBC 4.83 05/28/2019 1053   RBC 5.04 07/15/2017 1805   HGB 13.1 05/28/2019 1053   HCT 39.6 05/28/2019 1053   PLT 206  05/28/2019 1053   MCV 82 05/28/2019 1053   MCH 27.1 05/28/2019 1053   MCH 26.4 07/15/2017 1805   MCHC 33.1 05/28/2019 1053   MCHC 33.3 07/15/2017 1805   RDW 13.5 05/28/2019 1053   LYMPHSABS 2.1  10/02/2017 1128   MONOABS 0.3 01/19/2017 1615   EOSABS 0.2 10/02/2017 1128   BASOSABS 0.0 10/02/2017 1128    ASSESSMENT AND PLAN: 1. Right lower quadrant abdominal pain -I suspect pain was due to constipation as it went away after she took some type of stool softener or laxative.  Encourage increased fiber in the diet which can be in the form of green leafy vegetables, drinking several glasses of water a day.  Also recommend purchasing MiraLAX over-the-counter and using as needed whenever she begins to feel constipated  2. Essential hypertension Not at goal.  Increase Cozaar to 75 mg daily. - losartan (COZAAR) 50 MG tablet; Take 1.5 tablet (75 mg total) by mouth daily.  Dispense: 45 tablet; Refill: 6  3. Hyperlipidemia, unspecified hyperlipidemia type Continue pravastatin  4. Postmenopausal estrogen deficiency Discussed osteoporosis screening with bone density study.  Patient is agreeable to having it ordered - DG Bone Density; Future     Patient was given the opportunity to ask questions.  Patient verbalized understanding of the plan and was able to repeat key elements of the plan.   No orders of the defined types were placed in this encounter.    Requested Prescriptions    No prescriptions requested or ordered in this encounter    No follow-ups on file.  Karle Plumber, MD, FACP

## 2020-01-25 ENCOUNTER — Other Ambulatory Visit: Payer: Self-pay | Admitting: Internal Medicine

## 2020-01-25 DIAGNOSIS — E785 Hyperlipidemia, unspecified: Secondary | ICD-10-CM

## 2020-01-26 ENCOUNTER — Ambulatory Visit
Admission: RE | Admit: 2020-01-26 | Discharge: 2020-01-26 | Disposition: A | Discharge status: Home / Self Care | Payer: Medicare HMO | Source: Ambulatory Visit | Attending: Internal Medicine | Admitting: Internal Medicine

## 2020-01-26 ENCOUNTER — Other Ambulatory Visit: Payer: Self-pay

## 2020-01-26 DIAGNOSIS — Z780 Asymptomatic menopausal state: Secondary | ICD-10-CM | POA: Diagnosis not present

## 2020-01-26 DIAGNOSIS — M85852 Other specified disorders of bone density and structure, left thigh: Secondary | ICD-10-CM | POA: Diagnosis not present

## 2020-01-27 DIAGNOSIS — Z20822 Contact with and (suspected) exposure to covid-19: Secondary | ICD-10-CM | POA: Diagnosis not present

## 2020-01-27 DIAGNOSIS — N390 Urinary tract infection, site not specified: Secondary | ICD-10-CM | POA: Diagnosis not present

## 2020-01-27 DIAGNOSIS — Z03818 Encounter for observation for suspected exposure to other biological agents ruled out: Secondary | ICD-10-CM | POA: Diagnosis not present

## 2020-01-28 ENCOUNTER — Telehealth: Payer: Self-pay

## 2020-01-28 NOTE — Telephone Encounter (Signed)
Contacted pt to go over bone density results pt is aware and doesn't have any questions or concerns  

## 2020-03-13 ENCOUNTER — Other Ambulatory Visit: Payer: Self-pay

## 2020-03-13 ENCOUNTER — Encounter: Payer: Self-pay | Admitting: Internal Medicine

## 2020-03-13 ENCOUNTER — Ambulatory Visit: Payer: Medicare Other | Attending: Internal Medicine | Admitting: Internal Medicine

## 2020-03-13 VITALS — BP 150/90 | HR 67 | Resp 16 | Wt 152.2 lb

## 2020-03-13 DIAGNOSIS — E782 Mixed hyperlipidemia: Secondary | ICD-10-CM | POA: Diagnosis not present

## 2020-03-13 DIAGNOSIS — Z23 Encounter for immunization: Secondary | ICD-10-CM

## 2020-03-13 DIAGNOSIS — I10 Essential (primary) hypertension: Secondary | ICD-10-CM | POA: Diagnosis not present

## 2020-03-13 DIAGNOSIS — R300 Dysuria: Secondary | ICD-10-CM

## 2020-03-13 LAB — POCT URINALYSIS DIP (CLINITEK)
Bilirubin, UA: NEGATIVE
Blood, UA: NEGATIVE
Glucose, UA: NEGATIVE mg/dL
Ketones, POC UA: NEGATIVE mg/dL
Leukocytes, UA: NEGATIVE
Nitrite, UA: NEGATIVE
POC PROTEIN,UA: NEGATIVE
Spec Grav, UA: 1.015 (ref 1.010–1.025)
Urobilinogen, UA: 0.2 E.U./dL
pH, UA: 5.5 (ref 5.0–8.0)

## 2020-03-13 MED ORDER — LOSARTAN POTASSIUM 50 MG PO TABS: 75.0000 mg | ORAL_TABLET | Freq: Every day | ORAL | 6 refills | Status: DC

## 2020-03-13 MED ORDER — ATORVASTATIN CALCIUM 20 MG PO TABS: 20.0000 mg | ORAL_TABLET | Freq: Every day | ORAL | 3 refills | Status: DC

## 2020-03-13 MED ORDER — AMLODIPINE BESYLATE 5 MG PO TABS: 5.0000 mg | ORAL_TABLET | Freq: Every day | ORAL | 4 refills | Status: DC

## 2020-03-13 NOTE — Progress Notes (Signed)
Patient ID: Jacqueline Orozco, female    DOB: 1950/06/24  MRN: [id number]  CC: Hypertension   Subjective: Jacqueline Orozco is a 69 y.o. female who presents for chronic ds manangement.  Interpreter, Adah Perl, from [employer] is with her and interprets Her concerns today include:  Hx of Chronic LBPdue to disc ds, HTN, HL, L hydronephrosis s/p pyeloplasty 06/2016, chronic hepB (does not have active infection, abs present 2014),Migraines.  Seen at [facility] 01/27/20 with dysuria x  Several days.  Found to have UTI with pos LE and nitrates on UA.   Ucx positive for E.coli.  Treated with abx x 5 days but she does not remember the name of the abx.  Symptoms resolved but 3 wks later she started having intermittent symptoms again.  No fever. Abdomen feels sore at times.  No gross hematuria.  HTN: compliant with Cozaar.  Does not have Norvasc.  Reports it was not given to her when she last RF meds from pharmacy No device to check BP.  Limits salt in foods No CP/SOB/LE edema  HL:  Compliant with Lipitor.  Tolerating the medication well.  HM:  Plans to get COVID vaccine later this wk.  Has appt later this mth.  She is due for flu vaccine and agreeable to having it done today.   Patient Active Problem List   Diagnosis Date Noted   Primary osteoarthritis of both knees 05/28/2019   Motion sickness 08/26/2017   Meniere disease, left 07/25/2017   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   Anterolisthesis 12/24/2016   Allergic contact dermatitis due to adhesives 07/02/2016   Hydronephrosis with ureteropelvic junction (UPJ) obstruction 04/15/2016   Osteoarthritis of right wrist 07/28/2015   Gastric and duodenal angiodysplasia    Seasonal allergies 09/15/2014   IBS (irritable bowel syndrome) 06/20/2014   Lumbar disc disease 09/29/2013   Gastric AVM 12/29/2012   Hx of adenomatous colonic polyps 10/16/2012   Plantar fasciitis, right 05/19/2012   Cervical radiculitis 02/12/2012   Vertigo 05/22/2011    HEPATITIS B, CHRONIC 03/28/2010   HLD (hyperlipidemia) 03/28/2010   Essential hypertension 03/28/2010     Current Outpatient Medications on File Prior to Visit  Medication Sig Dispense Refill   acetaminophen (TYLENOL 8 HOUR) 650 MG CR tablet Take 1 tablet (650 mg total) by mouth every 8 (eight) hours as needed for pain. 90 tablet 1   Calcium Citrate 250 MG TABS Take 2 tablets (500 mg total) by mouth daily. 60 tablet 11   calcium citrate-vitamin D 500-400 MG-UNIT chewable tablet Chew 1 tablet by mouth 2 (two) times daily. 180 tablet 1   cetirizine (ZYRTEC) 10 MG tablet Take 1 tablet (10 mg total) by mouth daily. 30 tablet 2   diclofenac sodium (VOLTAREN) 1 % GEL Apply 2 g topically 4 (four) times daily. 100 g 0   diclofenac Sodium (VOLTAREN) 1 % GEL Apply 2 g topically 4 (four) times daily. 100 g 3   dicyclomine (BENTYL) 20 MG tablet TAKE 1 TABLET BY MOUTH 3 TIMES DAILY BEFORE MEALS.  Needs to be seen by PCP prior to next RF request. 90 tablet 0   methocarbamol (ROBAXIN) 500 MG tablet Take 1 tablet (500 mg total) by mouth 2 (two) times daily as needed for muscle spasms. 30 tablet 0   omeprazole (PRILOSEC) 40 MG capsule Take 1 capsule (40 mg total) by mouth daily. 90 capsule 3   polyethylene glycol powder (GLYCOLAX/MIRALAX) powder Take 17 grams PO PRN 255 g 4   No current  facility-administered medications on file prior to visit.    Allergies  Allergen Reactions   Aspirin Other (See Comments)    stomach pain, stomach bleeding   Penicillins Nausea And Vomiting and Other (See Comments)    Dizzy Has patient had a PCN reaction causing immediate rash, facial/tongue/throat swelling, SOB or lightheadedness with hypotension: No Has patient had a PCN reaction causing severe rash involving mucus membranes or skin necrosis: No Has patient had a PCN reaction that required hospitalization; No Has patient had a PCN reaction occurring within the last 10 years: No If all of the above  answers are "NO", then may proceed with Cephalosporin use.    Latex Itching   Streptomycin Nausea And Vomiting and Rash   Tramadol Nausea Only    Social History   Socioeconomic History   Marital status: Married    Spouse name: Not on file   Number of children: 6   Years of education: 12    Highest education level: Not on file  Occupational History   Occupation: Unemployed   Tobacco Use   Smoking status: Never Smoker   Smokeless tobacco: Never Used  Substance and Sexual Activity   Alcohol use: Yes    Comment: occasional wine   Drug use: No   Sexual activity: Yes    Birth control/protection: None  Other Topics Concern   Not on file  Social History Narrative   From [place].   Lived in [place] since 1994.    Live with husband.   6 adult children.    Speaks some English and reads some  [place].    Caffeine use: Coffee daily   Right handed   Social Determinants of Health   Financial Resource Strain:    Difficulty of Paying Living Expenses: Not on file  Food Insecurity:    Worried About [place] in the Last Year: Not on file   Ran Out of Food in the Last Year: Not on file  Transportation Needs:    Lack of Transportation (Medical): Not on file   Lack of Transportation (Non-Medical): Not on file  Physical Activity:    Days of Exercise per Week: Not on file   Minutes of Exercise per Session: Not on file  Stress:    Feeling of Stress : Not on file  Social Connections:    Frequency of Communication with Friends and Family: Not on file   Frequency of Social Gatherings with Friends and Family: Not on file   Attends Religious Services: Not on file   Active Member of Clubs or Organizations: Not on file   Attends Archivist Meetings: Not on file   Marital Status: Not on file  Intimate Partner Violence:    Fear of Current or Ex-Partner: Not on file   Emotionally Abused: Not on file   Physically Abused: Not on file   Sexually  Abused: Not on file    Family History  Problem Relation Age of Onset   Hypertension Mother    Stomach cancer Father    Liver disease Maternal Uncle    Lung cancer Maternal Grandmother    Breast cancer Neg Hx     Past Surgical History:  Procedure Laterality Date   COLONOSCOPY WITH PROPOFOL N/A 01/03/2015   Procedure: COLONOSCOPY WITH PROPOFOL;  Surgeon: Jerene Bears, MD;  Location: [facility];  Service: Gastroenterology;  Laterality: N/A;   CYSTOSCOPY W/ URETERAL STENT PLACEMENT Left 06/17/2016   Procedure: CYSTOSCOPY WITH RETROGRADE PYELOGRAM/URETERAL STENT PLACEMENT;  Surgeon: Raynelle Bring, MD;  Location: [facility];  Service: Urology;  Laterality: Left;   ESOPHAGOGASTRODUODENOSCOPY (EGD) WITH PROPOFOL N/A 01/03/2015   Procedure: ESOPHAGOGASTRODUODENOSCOPY (EGD) WITH PROPOFOL;  Surgeon: Jerene Bears, MD;  Location: [facility];  Service: Gastroenterology;  Laterality: N/A;   ESOPHAGOGASTRODUODENOSCOPY ENDOSCOPY     several times   HOT HEMOSTASIS N/A 01/03/2015   Procedure: HOT HEMOSTASIS (ARGON PLASMA COAGULATION/BICAP);  Surgeon: Jerene Bears, MD;  Location: [facility];  Service: Gastroenterology;  Laterality: N/A;   NO PAST SURGERIES     ROBOT ASSISTED PYELOPLASTY Left 06/17/2016   Procedure: XI ROBOTIC ASSISTED PYELOPLASTY;  Surgeon: Raynelle Bring, MD;  Location: [facility];  Service: Urology;  Laterality: Left;    ROS: Review of Systems Negative except as stated above  PHYSICAL EXAM: BP (!) 150/90    Pulse 67    Resp 16    Wt 152 lb 3.2 oz (69 kg)    SpO2 97%    BMI 27.84 kg/m   Wt Readings from Last 3 Encounters:  03/13/20 152 lb 3.2 oz (69 kg)  11/02/19 154 lb 9.6 oz (70.1 kg)  05/28/19 156 lb 12.8 oz (71.1 kg)    Physical Exam  General appearance - alert, well appearing, and in no distress Mental status - normal mood, behavior, speech, dress, motor activity, and thought processes Mouth - mucous membranes moist, pharynx normal without lesions Neck - supple, no  significant adenopathy Chest - clear to auscultation, no wheezes, rales or rhonchi, symmetric air entry Heart - normal rate, regular rhythm, normal S1, S2, no murmurs, rubs, clicks or gallops Abdomen - soft, nontender, nondistended, no masses or organomegaly Extremities - peripheral pulses normal, no pedal edema, no clubbing or cyanosis   CMP Latest Ref Rng & Units 05/28/2019 07/25/2017 07/16/2017  Glucose 65 - 99 mg/dL 94 - 116(H)  BUN 8 - 27 mg/dL 15 - 13  Creatinine 0.57 - 1.00 mg/dL 0.60 - 0.50  Sodium 134 - 144 mmol/L 140 - 140  Potassium 3.5 - 5.2 mmol/L 3.8 3.6 3.4(L)  Chloride 96 - 106 mmol/L 103 - 102  CO2 20 - 29 mmol/L 23 - -  Calcium 8.7 - 10.3 mg/dL 9.5 - -  Total Protein 6.0 - 8.5 g/dL 7.2 - -  Total Bilirubin 0.0 - 1.2 mg/dL 0.4 - -  Alkaline Phos 39 - 117 IU/L 84 - -  AST 0 - 40 IU/L 16 - -  ALT 0 - 32 IU/L 17 - -   Lipid Panel     Component Value Date/Time   CHOL 222 (H) 05/28/2019 1053   TRIG 167 (H) 05/28/2019 1053   HDL 76 05/28/2019 1053   CHOLHDL 2.9 05/28/2019 1053   CHOLHDL 2.7 11/23/2015 1203   VLDL 24 11/23/2015 1203   LDLCALC 117 (H) 05/28/2019 1053   LDLDIRECT 141.5 03/27/2010 0953    CBC    Component Value Date/Time   WBC 6.9 05/28/2019 1053   WBC 8.0 07/15/2017 1805   RBC 4.83 05/28/2019 1053   RBC 5.04 07/15/2017 1805   HGB 13.1 05/28/2019 1053   HCT 39.6 05/28/2019 1053   PLT 206 05/28/2019 1053   MCV 82 05/28/2019 1053   MCH 27.1 05/28/2019 1053   MCH 26.4 07/15/2017 1805   MCHC 33.1 05/28/2019 1053   MCHC 33.3 07/15/2017 1805   RDW 13.5 05/28/2019 1053   LYMPHSABS 2.1 10/02/2017 1128   MONOABS 0.3 01/19/2017 1615   EOSABS 0.2 10/02/2017 1128   BASOSABS 0.0 10/02/2017  1128   Results for orders placed or performed in visit on 03/13/20  POCT URINALYSIS DIP (CLINITEK)  Result Value Ref Range   Color, UA yellow yellow   Clarity, UA clear clear   Glucose, UA negative negative mg/dL   Bilirubin, UA negative negative   Ketones, POC  UA negative negative mg/dL   Spec Grav, UA 1.015 1.010 - 1.025   Blood, UA negative negative   pH, UA 5.5 5.0 - 8.0   POC PROTEIN,UA negative negative, trace   Urobilinogen, UA 0.2 0.2 or 1.0 E.U./dL   Nitrite, UA Negative Negative   Leukocytes, UA Negative Negative     ASSESSMENT AND PLAN: 1. Essential hypertension Not at goal.  Patient has been off amlodipine thinking that it was discontinued.  She continues to take Cozaar.  I have restarted amlodipine at a dose of 5 mg instead of the 10 mg.  Continue to limit salt in the foods. - CBC - Comprehensive metabolic panel - Lipid panel - amLODipine (NORVASC) 5 MG tablet; Take 1 tablet (5 mg total) by mouth daily.  Dispense: 90 tablet; Refill: 4 - losartan (COZAAR) 50 MG tablet; Take 1.5 tablets (75 mg total) by mouth daily.  Dispense: 45 tablet; Refill: 6  2. Mixed hyperlipidemia - atorvastatin (LIPITOR) 20 MG tablet; Take 1 tablet (20 mg total) by mouth daily.  Dispense: 90 tablet; Refill: 3  3. Dysuria Urine dip today is negative.  I have ordered urine culture.  We will hold off on prescribing antibiotics for now. - Urine Culture - POCT URINALYSIS DIP (CLINITEK)  4. Need for COVID-19 vaccine Patient plans to get the COVID-19 vaccine later this month.  She has already scheduled an appointment.  Encouraged her to do so.  5. Need for immunization against influenza Given today. - Flu Vaccine QUAD 36+ mos IM     Patient was given the opportunity to ask questions.  Patient verbalized understanding of the plan and was able to repeat key elements of the plan.   Orders Placed This Encounter  Procedures   Urine Culture   Flu Vaccine QUAD 36+ mos IM   CBC   Comprehensive metabolic panel   Lipid panel   POCT URINALYSIS DIP (CLINITEK)     Requested Prescriptions   Signed Prescriptions Disp Refills   amLODipine (NORVASC) 5 MG tablet 90 tablet 4    Sig: Take 1 tablet (5 mg total) by mouth daily.   atorvastatin (LIPITOR)  20 MG tablet 90 tablet 3    Sig: Take 1 tablet (20 mg total) by mouth daily.   losartan (COZAAR) 50 MG tablet 45 tablet 6    Sig: Take 1.5 tablets (75 mg total) by mouth daily.    Return in about 4 months (around 07/14/2020) for Give appt with Amy Minette Brine in 1 month for AWV.  Karle Plumber, MD, FACP

## 2020-03-13 NOTE — Patient Instructions (Addendum)
Your blood pressure is not at goal.  You should be taking the Losartan 75 mg daily AND amlodipine 5 mg daily.  I have sent a refill on the amlodipine to your pharmacy for pickup.  Influenza Virus Vaccine injection (Fluarix) What is this medicine? INFLUENZA VIRUS VACCINE (in floo EN zuh VAHY ruhs vak SEEN) helps to reduce the risk of getting influenza also known as the flu. This medicine may be used for other purposes; ask your health care provider or pharmacist if you have questions. COMMON BRAND NAME(S): Fluarix, Fluzone What should I tell my health care provider before I take this medicine? They need to know if you have any of these conditions:  bleeding disorder like hemophilia  fever or infection  Guillain-Barre syndrome or other neurological problems  immune system problems  infection with the human immunodeficiency virus (HIV) or AIDS  low blood platelet counts  multiple sclerosis  an unusual or allergic reaction to influenza virus vaccine, eggs, chicken proteins, latex, gentamicin, other medicines, foods, dyes or preservatives  pregnant or trying to get pregnant  breast-feeding How should I use this medicine? This vaccine is for injection into a muscle. It is given by a health care professional. A copy of Vaccine Information Statements will be given before each vaccination. Read this sheet carefully each time. The sheet may change frequently. Talk to your pediatrician regarding the use of this medicine in children. Special care may be needed. Overdosage: If you think you have taken too much of this medicine contact a poison control center or emergency room at once. NOTE: This medicine is only for you. Do not share this medicine with others. What if I miss a dose? This does not apply. What may interact with this medicine?  chemotherapy or radiation therapy  medicines that lower your immune system like etanercept, anakinra, infliximab, and adalimumab  medicines that  treat or prevent blood clots like warfarin  phenytoin  steroid medicines like prednisone or cortisone  theophylline  vaccines This list may not describe all possible interactions. Give your health care provider a list of all the medicines, herbs, non-prescription drugs, or dietary supplements you use. Also tell them if you smoke, drink alcohol, or use illegal drugs. Some items may interact with your medicine. What should I watch for while using this medicine? Report any side effects that do not go away within 3 days to your doctor or health care professional. Call your health care provider if any unusual symptoms occur within 6 weeks of receiving this vaccine. You may still catch the flu, but the illness is not usually as bad. You cannot get the flu from the vaccine. The vaccine will not protect against colds or other illnesses that may cause fever. The vaccine is needed every year. What side effects may I notice from receiving this medicine? Side effects that you should report to your doctor or health care professional as soon as possible:  allergic reactions like skin rash, itching or hives, swelling of the face, lips, or tongue Side effects that usually do not require medical attention (report to your doctor or health care professional if they continue or are bothersome):  fever  headache  muscle aches and pains  pain, tenderness, redness, or swelling at site where injected  weak or tired This list may not describe all possible side effects. Call your doctor for medical advice about side effects. You may report side effects to FDA at 1-800-FDA-1088. Where should I keep my medicine? This vaccine is  only given in a clinic, pharmacy, doctor's office, or other health care setting and will not be stored at home. NOTE: This sheet is a summary. It may not cover all possible information. If you have questions about this medicine, talk to your doctor, pharmacist, or health care provider.   2020 Elsevier/Gold Standard (2007-12-23 09:30:40)

## 2020-03-14 ENCOUNTER — Telehealth: Payer: Self-pay

## 2020-03-14 LAB — LIPID PANEL
Chol/HDL Ratio: 3 ratio (ref 0.0–4.4)
Cholesterol, Total: 201 mg/dL — ABNORMAL HIGH (ref 100–199)
HDL: 68 mg/dL (ref >39)
LDL Chol Calc (NIH): 120 mg/dL — ABNORMAL HIGH (ref 0–99)
Triglycerides: 74 mg/dL (ref 0–149)
VLDL Cholesterol Cal: 13 mg/dL (ref 5–40)

## 2020-03-14 LAB — CBC
Hematocrit: 40.6 % (ref 34.0–46.6)
Hemoglobin: 13.5 g/dL (ref 11.1–15.9)
MCH: 27.8 pg (ref 26.6–33.0)
MCHC: 33.3 g/dL (ref 31.5–35.7)
MCV: 84 fL (ref 79–97)
Platelets: 213 10*3/uL (ref 150–450)
RBC: 4.85 x10E6/uL (ref 3.77–5.28)
RDW: 13.2 % (ref 11.7–15.4)
WBC: 5.4 10*3/uL (ref 3.4–10.8)

## 2020-03-14 LAB — COMPREHENSIVE METABOLIC PANEL
ALT: 18 IU/L (ref 0–32)
AST: 23 IU/L (ref 0–40)
Albumin/Globulin Ratio: 1.4 (ref 1.2–2.2)
Albumin: 4.7 g/dL (ref 3.8–4.8)
Alkaline Phosphatase: 93 IU/L (ref 44–121)
BUN/Creatinine Ratio: 17 (ref 12–28)
BUN: 15 mg/dL (ref 8–27)
Bilirubin Total: 0.8 mg/dL (ref 0.0–1.2)
CO2: 24 mmol/L (ref 20–29)
Calcium: 10.2 mg/dL (ref 8.7–10.3)
Chloride: 103 mmol/L (ref 96–106)
Creatinine, Ser: 0.89 mg/dL (ref 0.57–1.00)
GFR calc Af Amer: 78 mL/min/{1.73_m2} (ref >59)
GFR calc non Af Amer: 67 mL/min/{1.73_m2} (ref >59)
Globulin, Total: 3.3 g/dL (ref 1.5–4.5)
Glucose: 98 mg/dL (ref 65–99)
Potassium: 4.2 mmol/L (ref 3.5–5.2)
Sodium: 142 mmol/L (ref 134–144)
Total Protein: 8 g/dL (ref 6.0–8.5)

## 2020-03-14 NOTE — Telephone Encounter (Signed)
Contacted pt to go over lab results pt didn't answer lvm asking pt to give a call back  °

## 2020-03-15 ENCOUNTER — Other Ambulatory Visit: Payer: Self-pay | Admitting: Internal Medicine

## 2020-03-15 ENCOUNTER — Telehealth: Payer: Self-pay | Admitting: Internal Medicine

## 2020-03-15 ENCOUNTER — Telehealth: Payer: Self-pay

## 2020-03-15 LAB — URINE CULTURE

## 2020-03-15 MED ORDER — CIPROFLOXACIN HCL 500 MG PO TABS: 500.0000 mg | ORAL_TABLET | Freq: Two times a day (BID) | ORAL | 0 refills | Status: DC

## 2020-03-15 NOTE — Telephone Encounter (Signed)
Copied from [place] [id number]. Topic: Quick Communication - Lab Results (Clinic Use ONLY) >> Mar 14, 2020 11:10 AM Jackelyn Knife, RMA wrote: Contacted pt to go over lab results pt didn't answer if pt calls back please give results

## 2020-03-15 NOTE — Telephone Encounter (Signed)
Phone call placed to patient today.  I have verified date of birth with patient.  Patient informed that her urine is growing bacteria that needs to be treated.  Informed her that I have sent a prescription for an antibiotic called ciprofloxacin to her pharmacy for her to pick up.  Patient expressed understanding and states she will get the prescription.

## 2020-03-15 NOTE — Telephone Encounter (Signed)
Interpreter Services does not support [employer] language.  TC to patient. Left VM earlier to return call for lab results.

## 2020-03-15 NOTE — Telephone Encounter (Signed)
Contacted pt to go over lab results pt didn't answer lvm asking pt to give a call back  °

## 2020-04-13 ENCOUNTER — Ambulatory Visit: Payer: Medicare Other | Attending: Family | Admitting: Family

## 2020-04-13 ENCOUNTER — Other Ambulatory Visit: Payer: Self-pay

## 2020-04-13 ENCOUNTER — Encounter: Payer: Self-pay | Admitting: Family

## 2020-04-13 VITALS — BP 134/86 | HR 68 | Temp 98.3°F | Resp 16 | Ht 62.5 in | Wt 154.0 lb

## 2020-04-13 DIAGNOSIS — Z23 Encounter for immunization: Secondary | ICD-10-CM

## 2020-04-13 DIAGNOSIS — Z789 Other specified health status: Secondary | ICD-10-CM | POA: Diagnosis not present

## 2020-04-13 DIAGNOSIS — R109 Unspecified abdominal pain: Secondary | ICD-10-CM

## 2020-04-13 DIAGNOSIS — R42 Dizziness and giddiness: Secondary | ICD-10-CM | POA: Diagnosis not present

## 2020-04-13 DIAGNOSIS — Z0000 Encounter for general adult medical examination without abnormal findings: Secondary | ICD-10-CM | POA: Diagnosis not present

## 2020-04-13 LAB — POCT URINALYSIS DIP (CLINITEK)
Bilirubin, UA: NEGATIVE
Blood, UA: NEGATIVE
Glucose, UA: NEGATIVE mg/dL
Ketones, POC UA: NEGATIVE mg/dL
Leukocytes, UA: NEGATIVE
Nitrite, UA: NEGATIVE
POC PROTEIN,UA: NEGATIVE
Spec Grav, UA: 1.015 (ref 1.010–1.025)
Urobilinogen, UA: 0.2 E.U./dL
pH, UA: 5 (ref 5.0–8.0)

## 2020-04-13 NOTE — Progress Notes (Signed)
Headache and dizziness at times  Apple, pencil, shoe

## 2020-04-13 NOTE — Progress Notes (Signed)
Subjective:   Jacqueline Orozco is a 69 y.o. female who presents for an Initial Medicare Annual Wellness Visit.  Review of Systems     Objective:    Today's Vitals   04/13/20 0838  BP: 134/86  Pulse: 68  Resp: 16  Temp: 98.3 F (36.8 C)  SpO2: 99%  Weight: 154 lb (69.9 kg)  Height: 5' 2.5" (1.588 m)  PainSc: 7   PainLoc: Generalized   Body mass index is 27.72 kg/m.  Advanced Directives 04/13/2020 07/15/2017 01/19/2017 12/23/2016 10/22/2016 09/10/2016 07/30/2016  Does Patient Have a Medical Advance Directive? No No No No No No No  Would patient like information on creating a medical advance directive? No - Patient declined No - Patient declined - No - Patient declined - - -  - Discussed with patient what is a Soil scientist. I went over with her Living Will and [place].  Patient given written packet also. She will look over it and consider executing a Living Will.  If she does, I requested that she brings a copy of it for our records.  Current Medications (verified) Outpatient Encounter Medications as of 04/13/2020  Medication Sig  . acetaminophen (TYLENOL 8 HOUR) 650 MG CR tablet Take 1 tablet (650 mg total) by mouth every 8 (eight) hours as needed for pain.  Marland Kitchen amLODipine (NORVASC) 5 MG tablet Take 1 tablet (5 mg total) by mouth daily.  Marland Kitchen atorvastatin (LIPITOR) 20 MG tablet Take 1 tablet (20 mg total) by mouth daily.  . calcium citrate-vitamin D 500-400 MG-UNIT chewable tablet Chew 1 tablet by mouth 2 (two) times daily.  . cetirizine (ZYRTEC) 10 MG tablet Take 1 tablet (10 mg total) by mouth daily.  . diclofenac sodium (VOLTAREN) 1 % GEL Apply 2 g topically 4 (four) times daily.  Marland Kitchen dicyclomine (BENTYL) 20 MG tablet TAKE 1 TABLET BY MOUTH 3 TIMES DAILY BEFORE MEALS.  Needs to be seen by PCP prior to next RF request.  . losartan (COZAAR) 50 MG tablet Take 1.5 tablets (75 mg total) by mouth daily.  . methocarbamol (ROBAXIN) 500 MG tablet Take 1 tablet (500 mg  total) by mouth 2 (two) times daily as needed for muscle spasms.  Marland Kitchen omeprazole (PRILOSEC) 40 MG capsule Take 1 capsule (40 mg total) by mouth daily.  . polyethylene glycol powder (GLYCOLAX/MIRALAX) powder Take 17 grams PO PRN  . Calcium Citrate 250 MG TABS Take 2 tablets (500 mg total) by mouth daily. (Patient not taking: Reported on 04/13/2020)  . ciprofloxacin (CIPRO) 500 MG tablet Take 1 tablet (500 mg total) by mouth 2 (two) times daily. (Patient not taking: Reported on 04/13/2020)  . [DISCONTINUED] diclofenac Sodium (VOLTAREN) 1 % GEL Apply 2 g topically 4 (four) times daily.   No facility-administered encounter medications on file as of 04/13/2020.    Allergies (verified) Aspirin, Penicillins, Latex, Streptomycin, and Tramadol   History: Past Medical History:  Diagnosis Date  . Adenomatous colon polyp   . Allergy   . Anemia   . 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 Blood transfusion without reported diagnosis   . Cold sore   . Fatty liver   . Gastric AVM   . GERD (gastroesophageal reflux disease)   . HEPATITIS B, CHRONIC 03/28/2010  . HYPERLIPIDEMIA 03/28/2010  . HYPERTENSION 03/28/2010  . IBS (irritable bowel syndrome)   . Internal hemorrhoids   . Lumbar disc disease 09/29/2013  . PONV (postoperative nausea and vomiting)    headache also  Past Surgical History:  Procedure Laterality Date  . COLONOSCOPY WITH PROPOFOL N/A 01/03/2015   Procedure: COLONOSCOPY WITH PROPOFOL;  Surgeon: Jerene Bears, MD;  Location: [facility];  Service: Gastroenterology;  Laterality: N/A;  . CYSTOSCOPY W/ URETERAL STENT PLACEMENT Left 06/17/2016   Procedure: CYSTOSCOPY WITH RETROGRADE PYELOGRAM/URETERAL STENT PLACEMENT;  Surgeon: Raynelle Bring, MD;  Location: [facility];  Service: Urology;  Laterality: Left;  . ESOPHAGOGASTRODUODENOSCOPY (EGD) WITH PROPOFOL N/A 01/03/2015   Procedure: ESOPHAGOGASTRODUODENOSCOPY (EGD) WITH PROPOFOL;  Surgeon: Jerene Bears, MD;  Location: [facility];  Service:  Gastroenterology;  Laterality: N/A;  . ESOPHAGOGASTRODUODENOSCOPY ENDOSCOPY     several times  . HOT HEMOSTASIS N/A 01/03/2015   Procedure: HOT HEMOSTASIS (ARGON PLASMA COAGULATION/BICAP);  Surgeon: Jerene Bears, MD;  Location: [facility];  Service: Gastroenterology;  Laterality: N/A;  . NO PAST SURGERIES    . ROBOT ASSISTED PYELOPLASTY Left 06/17/2016   Procedure: XI ROBOTIC ASSISTED PYELOPLASTY;  Surgeon: Raynelle Bring, MD;  Location: [facility];  Service: Urology;  Laterality: Left;   Family History  Problem Relation Age of Onset  . Hypertension Mother   . Stomach cancer Father   . Liver disease Maternal Uncle   . Lung cancer Maternal Grandmother   . Breast cancer Neg Hx    Social History   Socioeconomic History  . Marital status: Married    Spouse name: Not on file  . Number of children: 6  . Years of education: 59   . Highest education level: Not on file  Occupational History  . Occupation: Unemployed   Tobacco Use  . Smoking status: Never Smoker  . Smokeless tobacco: Never Used  Substance and Sexual Activity  . Alcohol use: Yes    Comment: occasional wine  . Drug use: No  . Sexual activity: Yes    Birth control/protection: None  Other Topics Concern  . Not on file  Social History Narrative   From [place].   Lived in [place] since 1994.    Live with husband.   6 adult children.    Speaks some English and reads some  [place].    Caffeine use: Coffee daily   Right handed   Social Determinants of Health   Financial Resource Strain:   . Difficulty of Paying Living Expenses: Not on file  Food Insecurity:   . Worried About Charity fundraiser in the Last Year: Not on file  . Ran Out of Food in the Last Year: Not on file  Transportation Needs:   . Lack of Transportation (Medical): Not on file  . Lack of Transportation (Non-Medical): Not on file  Physical Activity:   . Days of Exercise per Week: Not on file  . Minutes of Exercise per Session: Not on file  Stress:   .  Feeling of Stress : Not on file  Social Connections:   . Frequency of Communication with Friends and Family: Not on file  . Frequency of Social Gatherings with Friends and Family: Not on file  . Attends Religious Services: Not on file  . Active Member of Clubs or Organizations: Not on file  . Attends Archivist Meetings: Not on file  . Marital Status: Not on file    Tobacco Counseling Counseling given: Not Answered - Not currently smoking and does not have a history of smoking.  Clinical Intake:   Pain Score: 7  - Left side pain especially when bending down and relief with standing up. Pain comes and goes. Says she  has had side pain since about 2016. Around 2018 was told that she has hydronephrosis around the same time and did not receive any additional treatment/ follow-up for this. Right side pain sometimes as well. Visit with primary physician on 03/13/2020 with dysuria and urine dip was negative at that time and urine culture was ordered.    - Bilateral wrist pain. Left worse than right especially left thumb. Occurring since 2004-2006. Had injury while working at [employer] and dropped box of her hands. Using [facility] and Tylenol.   Diabetic? No  Interpreter Needed?: Yes Interpreter Agency: [employer] Interpreter Name: Ronny Flurry [facility] Patient Declined Interpreter : No Patient signed [place] waiver: No   Activities of Daily Living No flowsheet data found.  Patient Care Team: Ladell Pier, MD as PCP - General (Internal Medicine)  Indicate any recent Medical Services you may have received from other than [facility] providers in the past year (date may be approximate).     Assessment:   This is a routine wellness examination for Addilynne.  Hearing/Vision screen  Hearing Screening   125Hz  250Hz  500Hz  1000Hz  2000Hz  3000Hz  4000Hz  6000Hz  8000Hz   Right ear:           Left ear:             Visual Acuity Screening   Right eye Left eye Both eyes  Without correction:     With  correction: 20/30 20/30 20/30     Dietary issues and exercise activities discussed: - vegetables  - tea with ginger and lemon  Goals    . Blood Pressure < 150/90      Depression Screen PHQ 2/9 Scores 04/13/2020 03/13/2020 11/02/2019 03/30/2018 10/08/2017 10/02/2017 07/25/2017  PHQ - 2 Score 3 0 3 2 1 3 3   PHQ- 9 Score 4 - 5 3 7 4 5     Fall Risk Fall Risk  04/13/2020 03/13/2020 11/02/2019 03/30/2018 10/22/2016  Falls in the past year? 0 0 0 No No  Number falls in past yr: 0 0 - - -  Injury with Fall? 0 0 - - -   Any stairs in or around the home? Yes  If so, are there any without handrails? Yes  Home free of loose throw rugs in walkways, pet beds, electrical cords, etc? Yes  Adequate lighting in your home to reduce risk of falls? Yes   ASSISTIVE DEVICES UTILIZED TO PREVENT FALLS: Life alert? No  Use of a cane, walker or w/c? No  Grab bars in the bathroom? No  Shower chair or bench in shower? No  Elevated toilet seat or a handicapped toilet? No   TIMED UP AND GO: Was the test performed? No .  Length of time to ambulate 10 feet:  Patient stood at the chair for 25 seconds stating the dizziness makes it hard for her to walk much. Says she has to stand still for several minutes to gather herself before walking. Was unable to complete the test in its entirety.   DIZZINESS:  Duration: months 2 or 3 minutes, ringing in the ears, 2 or 3 times per week   Description of symptoms: room spinning, and feeling off balance  Duration of episode: minutes Dizziness frequency: recurrent Provoking factors: none sometimes walking  Aggravating factors: sometimes just walking Triggered by bending over: yes Aggravated by exertion, coughing, loud noises: yes, loud noises Recent head injury: no Recent or current viral symptoms: no Nausea: yes Vomiting: no Tinnitus: yes Hearing loss: no Headache: yes Photophobia/phonophobia: denies Unsteady gait:  sometimes Diplopia, dysarthria, dysphagia or weakness:  sometimes blurry Diaphoresis: no Dyspnea: no Chest pain: a lot of coughing may cause this   Cognitive Function: MMSE - Mini Mental State Exam 04/13/2020  Orientation to time 4  Orientation to Place 5  Registration 3  Attention/ Calculation 0  Recall 3  Language- name 2 objects 2  Language- repeat 1  Language- follow 3 step command 0  Language- read & follow direction 0  Write a sentence 1  Copy design 1  Total score 20  - Score 20/30. Will retest in about 6 months with primary physician. Says she doesn't exactly know what things she commonly forgets but sometimes she remembers them later. Also, language may be a barrier as patient has interpreter with her.   Immunizations Immunization History  Administered Date(s) Administered  . Influenza Split 04/01/2011, 03/12/2012  . Influenza, High Dose Seasonal PF 03/23/2018  . Influenza,inj,Quad PF,6+ Mos 04/11/2015, 03/11/2016, 03/13/2020  . Influenza-Unspecified 03/27/2014  . Pneumococcal Conjugate-13 04/07/2018  . Pneumococcal Polysaccharide-23 05/28/2019  . Tdap 04/01/2011    TDAP status: Up to date Flu Vaccine status: Up to date Pneumococcal vaccine status: Up to date  Covid-19 vaccine status: Patient says that she had 2 [employer]. Does not have documentation with her. Asked her to bring documentation at her next appointment.   Qualifies for Shingles Vaccine? Yes   Shingrix Completed?: Yes  Screening Tests Health Maintenance  Topic Date Due  . COVID-19 Vaccine (1) Never done  . TETANUS/TDAP  03/31/2021  . MAMMOGRAM  08/05/2021  . COLONOSCOPY  01/02/2025  . INFLUENZA VACCINE  Completed  . DEXA SCAN  Completed  . Hepatitis C Screening  Completed  . PNA vac Low Risk Adult  Completed    Health Maintenance  Health Maintenance Due  Topic Date Due  . COVID-19 Vaccine (1) Never done    Colorectal cancer screening: Completed 01/03/2015. Repeat every 10 years Mammogram status: Completed 08/06/2019.  Repeat every year Bone Density status: Completed 01/16/2020  Lung Cancer Screening: (Low Dose CT Chest recommended if Age 75-80 years, 30 pack-year currently smoking OR have quit w/in 15years.) does not qualify.   Lung Cancer Screening Referral: not applicable   Additional Screening:  Hepatitis C Screening: does not qualify; Completed 12/29/2012  Vision Screening: Recommended annual ophthalmology exams for early detection of glaucoma and other disorders of the eye. Is the patient up to date with their annual eye exam?  No , last visit in 2019 Who is the provider or what is the name of the office in which the patient attends annual eye exams? Dr. Truman Hayward  If pt is not established with a provider, would they like to be referred to a provider to establish care? patient established and plans to follow-up soon.    Dental Screening: Recommended annual dental exams for proper oral hygiene  Community Resource Referral / Chronic Care Management: CRR required this visit?  No   CCM required this visit?  No   General appearance - alert, well appearing, and in no distress and oriented to person, place, and time Mental status - alert, oriented to person, place, and time, normal mood, behavior, speech, dress, motor activity, and thought processes Ears - bilateral TM's and external ear canals normal Neck - supple, no significant adenopathy Lymphatics - no palpable lymphadenopathy, no hepatosplenomegaly Chest - clear to auscultation, no wheezes, rales or rhonchi, symmetric air entry, no tachypnea, retractions or cyanosis Heart - normal rate, regular rhythm, normal S1, S2,  no murmurs, rubs, clicks or gallops Abdomen - soft, nontender, nondistended, no masses or organomegaly Neurological - alert, oriented, normal speech, no focal findings or movement disorder noted  Plan:  1. Encounter for Medicare annual wellness exam: - Patient presents for Medicare Annual Wellness exam.  - MMSE score 20/30. Patient  says she is forgetful and sometimes later can recall what she forgot. Also, decreased score may be related to language barrier as patient is accompanied by an interpreter.  - Keep all scheduled appointments with primary physician.  2. Flank pain: - Urinalysis to check for possible urinary tract infection, resulted negative.  - POCT URINALYSIS DIP (CLINITEK)  3. Dizziness: - Chronic.  - On Meclizine in the past and worked but caused drowsiness so she did not take this consistently. Meclizine contraindicated in patients over 49 years old. - Referral to Neurology for further evaluation and management. - Ambulatory referral to Neurology  4. Need for shingles vaccine: - Shingles vaccine administered during today's visit.   5. Language barrier: - Patient accompanied with [place] interpreter, Adah Perl.   I have personally reviewed and noted the following in the patient's chart:   . Medical and social history . Use of alcohol, tobacco or illicit drugs  . Current medications and supplements . Functional ability and status . Nutritional status . Physical activity . Advanced directives . List of other physicians . Hospitalizations, surgeries, and ER visits in previous 12 months . Vitals . Screenings to include cognitive, depression, and falls . Referrals and appointments  In addition, I have reviewed and discussed with patient certain preventive protocols, quality metrics, and best practice recommendations. A written personalized care plan for preventive services as well as general preventive health recommendations were provided to patient.     Camillia Herter, NP   04/13/2020

## 2020-04-13 NOTE — Patient Instructions (Addendum)
Please give patient copy of Medical Advanced Directive. Urinalysis today. Referral to Dermatology. Referral to Neurology. Shingles vaccine today. Follow-up with primary physician in 1 month or sooner if needed.     Ms. Jacqueline Orozco , Thank you for taking time to come for your Medicare Wellness Visit. I appreciate your ongoing commitment to your health goals. Please review the following plan we discussed and let me know if I can assist you in the future.   These are the goals we discussed: Goals    . Blood Pressure < 150/90       This is a list of the screening recommended for you and due dates:  Health Maintenance  Topic Date Due  . COVID-19 Vaccine (1) Never done  . Tetanus Vaccine  03/31/2021  . Mammogram  08/05/2021  . Colon Cancer Screening  01/02/2025  . Flu Shot  Completed  . DEXA scan (bone density measurement)  Completed  .  Hepatitis C: One time screening is recommended by Center for Disease Control  (CDC) for  adults born from [birth year] through 1963.   Completed  . Pneumonia vaccines  Completed

## 2020-04-14 NOTE — Progress Notes (Signed)
Please call patient with update.  ° °No urinary tract infection.

## 2020-06-13 ENCOUNTER — Institutional Professional Consult (permissible substitution): Payer: Medicare Other | Admitting: Neurology

## 2020-07-05 ENCOUNTER — Other Ambulatory Visit: Payer: Self-pay | Admitting: Internal Medicine

## 2020-07-05 DIAGNOSIS — Z1231 Encounter for screening mammogram for malignant neoplasm of breast: Secondary | ICD-10-CM

## 2020-07-14 ENCOUNTER — Other Ambulatory Visit: Payer: Self-pay

## 2020-07-14 ENCOUNTER — Ambulatory Visit: Payer: Medicare Other | Attending: Internal Medicine | Admitting: Internal Medicine

## 2020-07-14 ENCOUNTER — Encounter: Payer: Self-pay | Admitting: Internal Medicine

## 2020-07-14 VITALS — BP 127/79 | HR 66 | Temp 98.2°F | Resp 16 | Wt 152.4 lb

## 2020-07-14 DIAGNOSIS — Z8619 Personal history of other infectious and parasitic diseases: Secondary | ICD-10-CM

## 2020-07-14 DIAGNOSIS — R42 Dizziness and giddiness: Secondary | ICD-10-CM | POA: Diagnosis not present

## 2020-07-14 DIAGNOSIS — E782 Mixed hyperlipidemia: Secondary | ICD-10-CM

## 2020-07-14 DIAGNOSIS — I10 Essential (primary) hypertension: Secondary | ICD-10-CM | POA: Diagnosis not present

## 2020-07-14 NOTE — Progress Notes (Signed)
Patient ID: Jacqueline Orozco, female    DOB: 13-Sep-1950  MRN: [id number]  CC: Hypertension   Subjective: Jacqueline Orozco is a 70 y.o. female who presents for chronic ds management Her concerns today include:  Hx of Chronic LBPdue to disc ds, HTN, HL, L hydronephrosis s/p pyeloplasty 06/2016, chronic hepB (does not have active infection, abs present 2014),Migraines.  HM:  Completed Medicare Wellness Visit 04/13/2020.  Had second COVID vaccine 05/30/20  HYPERTENSION Currently taking: see medication list Med Adherence: [x]  Yes  -on Norvasc and Cozaar. Medication side effects: []  Yes    [x]  No Adherence with salt restriction: [x]  Yes    []  No Home Monitoring?: []  Yes    [x]  No Monitoring Frequency: []  Yes    []  No Home BP results range: []  Yes    []  No SOB? []  Yes    [x]  No Chest Pain?: []  Yes    [x]  No Leg swelling?: []  Yes    [x]  No Headaches?: []  Yes    [x]  No Dizziness? []  Yes    [x]  No Comments: stays active taking care of grandchild  HL:  Taking Atorvastatin.  LDL cholesterol was 120 on last visit. I recommended increase Lipitor to 30 mg but we were unable to reach pt with lab results.  I went over lab results with her.  Dizziness: referred to neurologist on last visit.  She was called but had to cancel because grand-duaghter had COVID Episodes occur about once a mth.  Can occur with sitting or standing but more so if she stands too quickly..  Drinks adequate fluids during the day.   Patient with history of chronic hepatitis B.  Diagnosed in 2014.  She had seen Dr. Raquel James in 2015.  He had done some serology test.  Her core total was positive, surface antibody positive.  He stated that this indicated immunity due to natural infection.  No evidence for active infection.  Patient Active Problem List   Diagnosis Date Noted  . Primary osteoarthritis of both knees 05/28/2019  . Motion sickness 08/26/2017  . Meniere disease, left 07/25/2017  . 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 Anterolisthesis  12/24/2016  . Allergic contact dermatitis due to adhesives 07/02/2016  . Hydronephrosis with ureteropelvic junction (UPJ) obstruction 04/15/2016  . Osteoarthritis of right wrist 07/28/2015  . Gastric and duodenal angiodysplasia   . Seasonal allergies 09/15/2014  . IBS (irritable bowel syndrome) 06/20/2014  . Lumbar disc disease 09/29/2013  . Gastric AVM 12/29/2012  . Hx of adenomatous colonic polyps 10/16/2012  . Plantar fasciitis, right 05/19/2012  . Cervical radiculitis 02/12/2012  . Vertigo 05/22/2011  . HEPATITIS B, CHRONIC 03/28/2010  . HLD (hyperlipidemia) 03/28/2010  . Essential hypertension 03/28/2010     Current Outpatient Medications on File Prior to Visit  Medication Sig Dispense Refill  . acetaminophen (TYLENOL 8 HOUR) 650 MG CR tablet Take 1 tablet (650 mg total) by mouth every 8 (eight) hours as needed for pain. 90 tablet 1  . amLODipine (NORVASC) 5 MG tablet Take 1 tablet (5 mg total) by mouth daily. 90 tablet 4  . atorvastatin (LIPITOR) 20 MG tablet Take 1 tablet (20 mg total) by mouth daily. 90 tablet 3  . Calcium Citrate 250 MG TABS Take 2 tablets (500 mg total) by mouth daily. (Patient not taking: Reported on 04/13/2020) 60 tablet 11  . calcium citrate-vitamin D 500-400 MG-UNIT chewable tablet Chew 1 tablet by mouth 2 (two) times daily. 180 tablet 1  . cetirizine (ZYRTEC) 10  MG tablet Take 1 tablet (10 mg total) by mouth daily. 30 tablet 2  . ciprofloxacin (CIPRO) 500 MG tablet Take 1 tablet (500 mg total) by mouth 2 (two) times daily. (Patient not taking: Reported on 04/13/2020) 14 tablet 0  . diclofenac sodium (VOLTAREN) 1 % GEL Apply 2 g topically 4 (four) times daily. 100 g 0  . dicyclomine (BENTYL) 20 MG tablet TAKE 1 TABLET BY MOUTH 3 TIMES DAILY BEFORE MEALS.  Needs to be seen by PCP prior to next RF request. 90 tablet 0  . losartan (COZAAR) 50 MG tablet Take 1.5 tablets (75 mg total) by mouth daily. 45 tablet 6  . methocarbamol (ROBAXIN) 500 MG tablet Take 1  tablet (500 mg total) by mouth 2 (two) times daily as needed for muscle spasms. 30 tablet 0  . omeprazole (PRILOSEC) 40 MG capsule Take 1 capsule (40 mg total) by mouth daily. 90 capsule 3  . polyethylene glycol powder (GLYCOLAX/MIRALAX) powder Take 17 grams PO PRN 255 g 4   No current facility-administered medications on file prior to visit.    Allergies  Allergen Reactions  . Aspirin Other (See Comments)    stomach pain, stomach bleeding  . Penicillins Nausea And Vomiting and Other (See Comments)    Dizzy Has patient had a PCN reaction causing immediate rash, facial/tongue/throat swelling, SOB or lightheadedness with hypotension: No Has patient had a PCN reaction causing severe rash involving mucus membranes or skin necrosis: No Has patient had a PCN reaction that required hospitalization; No Has patient had a PCN reaction occurring within the last 10 years: No If all of the above answers are "NO", then may proceed with Cephalosporin use.   . Latex Itching  . Streptomycin Nausea And Vomiting and Rash  . Tramadol Nausea Only    Social History   Socioeconomic History  . Marital status: Married    Spouse name: Not on file  . Number of children: 6  . Years of education: 78   . Highest education level: Not on file  Occupational History  . Occupation: Unemployed   Tobacco Use  . Smoking status: Never Smoker  . Smokeless tobacco: Never Used  Substance and Sexual Activity  . Alcohol use: Yes    Comment: occasional wine  . Drug use: No  . Sexual activity: Yes    Birth control/protection: None  Other Topics Concern  . Not on file  Social History Narrative   From [place].   Lived in [place] since 1994.    Live with husband.   6 adult children.    Speaks some English and reads some  [place].    Caffeine use: Coffee daily   Right handed   Social Determinants of Health   Financial Resource Strain: Not on file  Food Insecurity: Not on file  Transportation Needs: Not on file   Physical Activity: Not on file  Stress: Not on file  Social Connections: Not on file  Intimate Partner Violence: Not on file    Family History  Problem Relation Age of Onset  . Hypertension Mother   . Stomach cancer Father   . Liver disease Maternal Uncle   . Lung cancer Maternal Grandmother   . Breast cancer Neg Hx     Past Surgical History:  Procedure Laterality Date  . COLONOSCOPY WITH PROPOFOL N/A 01/03/2015   Procedure: COLONOSCOPY WITH PROPOFOL;  Surgeon: Jerene Bears, MD;  Location: [facility];  Service: Gastroenterology;  Laterality: N/A;  . CYSTOSCOPY W/ URETERAL  STENT PLACEMENT Left 06/17/2016   Procedure: CYSTOSCOPY WITH RETROGRADE PYELOGRAM/URETERAL STENT PLACEMENT;  Surgeon: Raynelle Bring, MD;  Location: [facility];  Service: Urology;  Laterality: Left;  . ESOPHAGOGASTRODUODENOSCOPY (EGD) WITH PROPOFOL N/A 01/03/2015   Procedure: ESOPHAGOGASTRODUODENOSCOPY (EGD) WITH PROPOFOL;  Surgeon: Jerene Bears, MD;  Location: [facility];  Service: Gastroenterology;  Laterality: N/A;  . ESOPHAGOGASTRODUODENOSCOPY ENDOSCOPY     several times  . HOT HEMOSTASIS N/A 01/03/2015   Procedure: HOT HEMOSTASIS (ARGON PLASMA COAGULATION/BICAP);  Surgeon: Jerene Bears, MD;  Location: [facility];  Service: Gastroenterology;  Laterality: N/A;  . NO PAST SURGERIES    . ROBOT ASSISTED PYELOPLASTY Left 06/17/2016   Procedure: XI ROBOTIC ASSISTED PYELOPLASTY;  Surgeon: Raynelle Bring, MD;  Location: [facility];  Service: Urology;  Laterality: Left;    ROS: Review of Systems Negative except as stated above  PHYSICAL EXAM: BP 127/79   Pulse 66   Temp 98.2 F (36.8 C)   Resp 16   Wt 152 lb 6.4 oz (69.1 kg)   SpO2 97%   BMI 27.43 kg/m   Wt Readings from Last 3 Encounters:  07/14/20 152 lb 6.4 oz (69.1 kg)  04/13/20 154 lb (69.9 kg)  03/13/20 152 lb 3.2 oz (69 kg)   Sitting 129/82, P59; standing BP 131/83, P68 Physical Exam  General appearance - alert, well appearing, and in no  distress Mental status - normal mood, behavior, speech, dress, motor activity, and thought processes Eyes - pupils equal and reactive, extraocular eye movements intact Mouth - mucous membranes moist, pharynx normal without lesions Chest - clear to auscultation, no wheezes, rales or rhonchi, symmetric air entry Heart - normal rate, regular rhythm, normal S1, S2, no murmurs, rubs, clicks or gallops Neurological - cranial nerves II through XII intact, motor and sensory grossly normal bilaterally.  Gait is normal. Extremities - peripheral pulses normal, no pedal edema, no clubbing or cyanosis   CMP Latest Ref Rng & Units 03/13/2020 05/28/2019 07/25/2017  Glucose 65 - 99 mg/dL 98 94 -  BUN 8 - 27 mg/dL 15 15 -  Creatinine 0.57 - 1.00 mg/dL 0.89 0.60 -  Sodium 134 - 144 mmol/L 142 140 -  Potassium 3.5 - 5.2 mmol/L 4.2 3.8 3.6  Chloride 96 - 106 mmol/L 103 103 -  CO2 20 - 29 mmol/L 24 23 -  Calcium 8.7 - 10.3 mg/dL 10.2 9.5 -  Total Protein 6.0 - 8.5 g/dL 8.0 7.2 -  Total Bilirubin 0.0 - 1.2 mg/dL 0.8 0.4 -  Alkaline Phos 44 - 121 IU/L 93 84 -  AST 0 - 40 IU/L 23 16 -  ALT 0 - 32 IU/L 18 17 -   Lipid Panel     Component Value Date/Time   CHOL 201 (H) 03/13/2020 0930   TRIG 74 03/13/2020 0930   HDL 68 03/13/2020 0930   CHOLHDL 3.0 03/13/2020 0930   CHOLHDL 2.7 11/23/2015 1203   VLDL 24 11/23/2015 1203   LDLCALC 120 (H) 03/13/2020 0930   LDLDIRECT 141.5 03/27/2010 0953    CBC    Component Value Date/Time   WBC 5.4 03/13/2020 0930   WBC 8.0 07/15/2017 1805   RBC 4.85 03/13/2020 0930   RBC 5.04 07/15/2017 1805   HGB 13.5 03/13/2020 0930   HCT 40.6 03/13/2020 0930   PLT 213 03/13/2020 0930   MCV 84 03/13/2020 0930   MCH 27.8 03/13/2020 0930   MCH 26.4 07/15/2017 1805   MCHC 33.3 03/13/2020 0930   MCHC 33.3  07/15/2017 1805   RDW 13.2 03/13/2020 0930   LYMPHSABS 2.1 10/02/2017 1128   MONOABS 0.3 01/19/2017 1615   EOSABS 0.2 10/02/2017 1128   BASOSABS 0.0 10/02/2017 1128     ASSESSMENT AND PLAN:  1. Essential hypertension At goal.  Continue current medications and low-salt diet.  2. Mixed hyperlipidemia Continue atorvastatin.  She is agreeable to having lipid profile rechecked today to see whether her LDL has improved - Lipid panel; Future  3. Dizziness Advised patient to go slow with position changes.  Drink adequate fluids during the day.  4. History of hepatitis B We will check hepatitis B DNA to see whether any varices in her system to suggest chronic hepatitis B. - Hepatitis B DNA, Ultraquantitative, PCR; Future   Patient was given the opportunity to ask questions.  Patient verbalized understanding of the plan and was able to repeat key elements of the plan.   Orders Placed This Encounter  Procedures  . Lipid panel  . Hepatitis B DNA, Ultraquantitative, PCR     Requested Prescriptions    No prescriptions requested or ordered in this encounter    Return in about 4 months (around 11/11/2020).  Karle Plumber, MD, FACP

## 2020-07-14 NOTE — Patient Instructions (Signed)

## 2020-07-18 ENCOUNTER — Ambulatory Visit: Payer: Medicare Other | Attending: Internal Medicine

## 2020-07-18 ENCOUNTER — Other Ambulatory Visit: Payer: Self-pay

## 2020-07-18 DIAGNOSIS — Z8619 Personal history of other infectious and parasitic diseases: Secondary | ICD-10-CM | POA: Diagnosis not present

## 2020-07-18 DIAGNOSIS — E782 Mixed hyperlipidemia: Secondary | ICD-10-CM

## 2020-07-18 DIAGNOSIS — Z7689 Persons encountering health services in other specified circumstances: Secondary | ICD-10-CM | POA: Diagnosis not present

## 2020-07-19 ENCOUNTER — Other Ambulatory Visit: Payer: Self-pay | Admitting: Internal Medicine

## 2020-07-19 DIAGNOSIS — E782 Mixed hyperlipidemia: Secondary | ICD-10-CM

## 2020-07-19 LAB — HEPATITIS B DNA, ULTRAQUANTITATIVE, PCR: HBV DNA SERPL PCR-ACNC: NOT DETECTED IU/mL

## 2020-07-19 LAB — LIPID PANEL
Chol/HDL Ratio: 2.8 ratio (ref 0.0–4.4)
Cholesterol, Total: 202 mg/dL — ABNORMAL HIGH (ref 100–199)
HDL: 71 mg/dL (ref >39)
LDL Chol Calc (NIH): 113 mg/dL — ABNORMAL HIGH (ref 0–99)
Triglycerides: 100 mg/dL (ref 0–149)
VLDL Cholesterol Cal: 18 mg/dL (ref 5–40)

## 2020-07-19 MED ORDER — ATORVASTATIN CALCIUM 20 MG PO TABS: 30.0000 mg | ORAL_TABLET | Freq: Every day | ORAL | 3 refills | Status: DC

## 2020-07-19 NOTE — Progress Notes (Signed)
Let patient know that her LDL cholesterol has improved but is still not at goal.  Her level is 113 and goal is to be less than 100.  I recommend increasing the Lipitor from 20 mg daily to 30 mg daily.  This means she will take 1-1/2 tablet daily of the 20 mg.  Updated prescription sent to her pharmacy.

## 2020-07-25 ENCOUNTER — Other Ambulatory Visit: Payer: Self-pay | Admitting: Internal Medicine

## 2020-07-25 DIAGNOSIS — K219 Gastro-esophageal reflux disease without esophagitis: Secondary | ICD-10-CM

## 2020-07-28 ENCOUNTER — Telehealth: Payer: Self-pay

## 2020-07-28 NOTE — Telephone Encounter (Signed)
Contacted pt to go over lab results pt is aware and doesn't have any questions or concerns 

## 2020-08-04 ENCOUNTER — Telehealth: Payer: Self-pay | Admitting: Internal Medicine

## 2020-08-04 DIAGNOSIS — E782 Mixed hyperlipidemia: Secondary | ICD-10-CM

## 2020-08-04 MED ORDER — ATORVASTATIN CALCIUM 40 MG PO TABS
40.0000 mg | ORAL_TABLET | Freq: Every day | ORAL | 1 refills | Status: DC
Start: 2020-08-04 — End: 2021-03-08

## 2020-08-08 NOTE — Telephone Encounter (Signed)
Patient returned call and voice understanding of the below. No further questions at this time

## 2020-08-08 NOTE — Telephone Encounter (Signed)
Contacted pt and left a detailed vm of Dr. Wynetta Emery message and if she has any questions or concerns to give a call

## 2020-08-22 ENCOUNTER — Ambulatory Visit
Admission: RE | Admit: 2020-08-22 | Discharge: 2020-08-22 | Disposition: A | Discharge status: Home / Self Care | Payer: Medicare Other | Source: Ambulatory Visit | Attending: Internal Medicine | Admitting: Internal Medicine

## 2020-08-22 ENCOUNTER — Other Ambulatory Visit: Payer: Self-pay

## 2020-08-22 DIAGNOSIS — Z1231 Encounter for screening mammogram for malignant neoplasm of breast: Secondary | ICD-10-CM

## 2020-10-24 ENCOUNTER — Other Ambulatory Visit: Payer: Self-pay | Admitting: Internal Medicine

## 2020-10-24 DIAGNOSIS — K219 Gastro-esophageal reflux disease without esophagitis: Secondary | ICD-10-CM

## 2020-11-14 ENCOUNTER — Ambulatory Visit: Payer: Medicare Other | Admitting: Internal Medicine

## 2020-11-26 ENCOUNTER — Other Ambulatory Visit: Payer: Self-pay | Admitting: Internal Medicine

## 2020-11-26 DIAGNOSIS — I10 Essential (primary) hypertension: Secondary | ICD-10-CM

## 2020-11-26 NOTE — Telephone Encounter (Signed)
Requested Prescriptions  Pending Prescriptions Disp Refills  . losartan (COZAAR) 50 MG tablet [Pharmacy Med Name: LOSARTAN POTASSIUM 50 MG TAB] 135 tablet 0    Sig: TAKE 1 AND 1/2 TABLETS BY MOUTH DAILY     Cardiovascular:  Angiotensin Receptor Blockers Failed - 11/26/2020  9:03 AM      Failed - Cr in normal range and within 180 days    Creat  Date Value Ref Range Status  07/02/2016 0.79 0.50 - 0.99 mg/dL Final    Comment:      For patients > or = 68 years of age: The upper reference limit for Creatinine is approximately 13% higher for people identified as African-American.      Creatinine, Ser  Date Value Ref Range Status  03/13/2020 0.89 0.57 - 1.00 mg/dL Final         Failed - K in normal range and within 180 days    Potassium  Date Value Ref Range Status  03/13/2020 4.2 3.5 - 5.2 mmol/L Final         Passed - Patient is not pregnant      Passed - Last BP in normal range    BP Readings from Last 1 Encounters:  07/14/20 127/79         Passed - Valid encounter within last 6 months    Recent Outpatient Visits          4 months ago Essential hypertension   [place], MD   8 months ago Essential hypertension   [place], MD   1 year ago Right lower quadrant abdominal pain   [place], Deborah B, MD   1 year ago Need for vaccination for Strep pneumoniae   [place], Jarome Matin, RPH-CPP   1 year ago Essential hypertension   [place], MD      Future Appointments            In 2 weeks Thereasa Solo, Casimer Bilis [place]

## 2020-12-14 ENCOUNTER — Ambulatory Visit: Payer: Medicare Other | Admitting: Physician Assistant

## 2021-01-04 ENCOUNTER — Telehealth: Payer: Self-pay | Admitting: Internal Medicine

## 2021-01-04 NOTE — Telephone Encounter (Signed)
Pt was called with interpreter (ID# [phone]) LVM informing pt provider will be virtual Monday for appt no need to come into office provider will call the number listed for appt

## 2021-01-08 ENCOUNTER — Other Ambulatory Visit: Payer: Self-pay

## 2021-01-08 ENCOUNTER — Ambulatory Visit: Payer: Medicare HMO | Attending: Internal Medicine | Admitting: Internal Medicine

## 2021-01-08 DIAGNOSIS — Z91199 Patient's noncompliance with other medical treatment and regimen due to unspecified reason: Secondary | ICD-10-CM

## 2021-01-08 DIAGNOSIS — Z5329 Procedure and treatment not carried out because of patient's decision for other reasons: Secondary | ICD-10-CM

## 2021-01-08 NOTE — Progress Notes (Signed)
Pt was scheduled for telephone visit this a.m.  Called at 9:17 a.m.  I LVMM to return call or reschedule.  Pt called back about 20 mins later but I was on video visit with another pt.  Told by front desk that I will call her back.  I subsequently tried 4 x to reach her unsuccessfully.

## 2021-01-26 ENCOUNTER — Other Ambulatory Visit: Payer: Self-pay | Admitting: Internal Medicine

## 2021-01-26 DIAGNOSIS — E782 Mixed hyperlipidemia: Secondary | ICD-10-CM

## 2021-01-26 NOTE — Telephone Encounter (Signed)
Requested medications are due for refill today yes  Requested medications are on the active medication list yes  Last refill 5/23  Last visit 07/2020  Future visit scheduled no  Notes to clinic This request does meet protocol, however, OV note asked pt to return in 6 months and she has not. Has already had a curtesy refill and there is no upcoming appointment scheduled. Did try to call back for missed virtual appt but then could not be reached.

## 2021-01-27 ENCOUNTER — Other Ambulatory Visit: Payer: Self-pay | Admitting: Internal Medicine

## 2021-01-27 DIAGNOSIS — K219 Gastro-esophageal reflux disease without esophagitis: Secondary | ICD-10-CM

## 2021-01-27 NOTE — Telephone Encounter (Signed)
Requested Prescriptions  Pending Prescriptions Disp Refills  . omeprazole (PRILOSEC) 40 MG capsule [Pharmacy Med Name: OMEPRAZOLE DR 40 MG CAPSULE] 90 capsule 0    Sig: TAKE 1 CAPSULE BY MOUTH EVERY DAY     Gastroenterology: Proton Pump Inhibitors Passed - 01/27/2021  9:01 AM      Passed - Valid encounter within last 12 months    Recent Outpatient Visits          2 weeks ago No-show for appointment   [place] Ladell Pier, MD   6 months ago Essential hypertension   [place], MD   10 months ago Essential hypertension   [place], MD   1 year ago Right lower quadrant abdominal pain   [place], MD   1 year ago Need for vaccination for Strep pneumoniae   [place], RPH-CPP

## 2021-02-25 ENCOUNTER — Other Ambulatory Visit: Payer: Self-pay | Admitting: Internal Medicine

## 2021-02-25 DIAGNOSIS — I10 Essential (primary) hypertension: Secondary | ICD-10-CM

## 2021-02-26 NOTE — Telephone Encounter (Signed)
Requested medication (s) are due for refill today:   Yes  Requested medication (s) are on the active medication list:   Yes  Future visit scheduled:   No   Was a No Show for her last appt with Dr. Wynetta Emery.    Does have an appt with Dr. Mack Hook at [facility] on 05/24/2021.   Last ordered: 11/26/2020  #135,  0 refills   Returned because protocol failed due to lab work is due.  Requested Prescriptions  Pending Prescriptions Disp Refills   losartan (COZAAR) 50 MG tablet [Pharmacy Med Name: LOSARTAN POTASSIUM 50 MG TAB] 135 tablet 0    Sig: TAKE 1 AND 1/2 TABLETS DAILY BY MOUTH     Cardiovascular:  Angiotensin Receptor Blockers Failed - 02/25/2021  9:05 AM      Failed - Cr in normal range and within 180 days    Creat  Date Value Ref Range Status  07/02/2016 0.79 0.50 - 0.99 mg/dL Final    Comment:      For patients > or = 68 years of age: The upper reference limit for Creatinine is approximately 13% higher for people identified as African-American.      Creatinine, Ser  Date Value Ref Range Status  03/13/2020 0.89 0.57 - 1.00 mg/dL Final          Failed - K in normal range and within 180 days    Potassium  Date Value Ref Range Status  03/13/2020 4.2 3.5 - 5.2 mmol/L Final          Passed - Patient is not pregnant      Passed - Last BP in normal range    BP Readings from Last 1 Encounters:  07/14/20 127/79          Passed - Valid encounter within last 6 months    Recent Outpatient Visits           1 month ago No-show for appointment   [place] Ladell Pier, MD   7 months ago Essential hypertension   [place], MD   11 months ago Essential hypertension   [place], MD   1 year ago Right lower quadrant abdominal pain   [place], MD   1 year ago  Need for vaccination for Strep pneumoniae   [place], RPH-CPP

## 2021-03-08 ENCOUNTER — Other Ambulatory Visit: Payer: Self-pay | Admitting: Internal Medicine

## 2021-03-08 DIAGNOSIS — E782 Mixed hyperlipidemia: Secondary | ICD-10-CM

## 2021-03-16 ENCOUNTER — Other Ambulatory Visit: Payer: Self-pay | Admitting: Internal Medicine

## 2021-03-16 DIAGNOSIS — I10 Essential (primary) hypertension: Secondary | ICD-10-CM

## 2021-03-16 NOTE — Telephone Encounter (Signed)
Requested medications are due for refill today yes  Requested medications are on the active medication list yes  Last refill 11/26/20  Last visit 07/14/20  Future visit scheduled no, NO SHOW 01/08/21  Notes to clinic Failed protocol due to no valid visit within 6  months, no upcoming visit scheduled.

## 2021-03-26 ENCOUNTER — Other Ambulatory Visit: Payer: Self-pay | Admitting: Internal Medicine

## 2021-03-26 DIAGNOSIS — I10 Essential (primary) hypertension: Secondary | ICD-10-CM

## 2021-03-26 DIAGNOSIS — K219 Gastro-esophageal reflux disease without esophagitis: Secondary | ICD-10-CM

## 2021-03-27 NOTE — Telephone Encounter (Signed)
Requested Prescriptions  Pending Prescriptions Disp Refills  . losartan (COZAAR) 50 MG tablet [Pharmacy Med Name: LOSARTAN POTASSIUM 50 MG TAB] 135 tablet 0    Sig: TAKE 1 AND 1/2 TABLETS DAILY BY MOUTH     Cardiovascular:  Angiotensin Receptor Blockers Failed - 03/26/2021  7:12 PM      Failed - Cr in normal range and within 180 days    Creat  Date Value Ref Range Status  07/02/2016 0.79 0.50 - 0.99 mg/dL Final    Comment:      For patients > or = 68 years of age: The upper reference limit for Creatinine is approximately 13% higher for people identified as African-American.      Creatinine, Ser  Date Value Ref Range Status  03/13/2020 0.89 0.57 - 1.00 mg/dL Final         Failed - K in normal range and within 180 days    Potassium  Date Value Ref Range Status  03/13/2020 4.2 3.5 - 5.2 mmol/L Final         Passed - Patient is not pregnant      Passed - Last BP in normal range    BP Readings from Last 1 Encounters:  07/14/20 127/79         Passed - Valid encounter within last 6 months    Recent Outpatient Visits          2 months ago No-show for appointment   [place] Ladell Pier, MD   8 months ago Essential hypertension   [place], Deborah B, MD   1 year ago Essential hypertension   [place], MD   1 year ago Right lower quadrant abdominal pain   [place], MD   1 year ago Need for vaccination for Strep pneumoniae   [place], RPH-CPP             . omeprazole (PRILOSEC) 40 MG capsule [Pharmacy Med Name: OMEPRAZOLE DR 40 MG CAPSULE] 90 capsule 0    Sig: TAKE 1 CAPSULE BY MOUTH EVERY DAY     Gastroenterology: Proton Pump Inhibitors Passed - 03/26/2021  7:12 PM      Passed - Valid encounter within last 12 months    Recent  Outpatient Visits          2 months ago No-show for appointment   [place] Ladell Pier, MD   8 months ago Essential hypertension   [place], Deborah B, MD   1 year ago Essential hypertension   [place], MD   1 year ago Right lower quadrant abdominal pain   [place], MD   1 year ago Need for vaccination for Strep pneumoniae   [place], RPH-CPP             . amLODipine (NORVASC) 5 MG tablet [Pharmacy Med Name: AMLODIPINE BESYLATE 5 MG TAB] 90 tablet 4    Sig: TAKE 1 TABLET BY MOUTH EVERY DAY     Cardiovascular:  Calcium Channel Blockers Passed - 03/26/2021  7:12 PM      Passed - Last BP in normal range  BP Readings from Last 1 Encounters:  07/14/20 127/79         Passed - Valid encounter within last 6 months    Recent Outpatient Visits          2 months ago No-show for appointment   [place] Ladell Pier, MD   8 months ago Essential hypertension   [place], Deborah B, MD   1 year ago Essential hypertension   [place], MD   1 year ago Right lower quadrant abdominal pain   [place], MD   1 year ago Need for vaccination for Strep pneumoniae   [place], RPH-CPP

## 2021-03-27 NOTE — Telephone Encounter (Signed)
Requested medication (s) are due for refill today: Yes  Requested medication (s) are on the active medication list: Yes  Last refill:  11/26/20  Future visit scheduled: No  Notes to clinic:  Left message to call and make appointment.    Requested Prescriptions  Pending Prescriptions Disp Refills   losartan (COZAAR) 50 MG tablet [Pharmacy Med Name: LOSARTAN POTASSIUM 50 MG TAB] 135 tablet 0    Sig: TAKE 1 AND 1/2 TABLETS DAILY BY MOUTH     Cardiovascular:  Angiotensin Receptor Blockers Failed - 03/26/2021  7:12 PM      Failed - Cr in normal range and within 180 days    Creat  Date Value Ref Range Status  07/02/2016 0.79 0.50 - 0.99 mg/dL Final    Comment:      For patients > or = 68 years of age: The upper reference limit for Creatinine is approximately 13% higher for people identified as African-American.      Creatinine, Ser  Date Value Ref Range Status  03/13/2020 0.89 0.57 - 1.00 mg/dL Final          Failed - K in normal range and within 180 days    Potassium  Date Value Ref Range Status  03/13/2020 4.2 3.5 - 5.2 mmol/L Final          Passed - Patient is not pregnant      Passed - Last BP in normal range    BP Readings from Last 1 Encounters:  07/14/20 127/79          Passed - Valid encounter within last 6 months    Recent Outpatient Visits           2 months ago No-show for appointment   [place] Ladell Pier, MD   8 months ago Essential hypertension   [place], Deborah B, MD   1 year ago Essential hypertension   [place], Deborah B, MD   1 year ago Right lower quadrant abdominal pain   [place], MD   1 year ago Need for vaccination for Strep pneumoniae   [place], Annie Main L, RPH-CPP               amLODipine (NORVASC) 5 MG  tablet [Pharmacy Med Name: AMLODIPINE BESYLATE 5 MG TAB] 90 tablet 4    Sig: TAKE 1 TABLET BY MOUTH EVERY DAY     Cardiovascular:  Calcium Channel Blockers Passed - 03/26/2021  7:12 PM      Passed - Last BP in normal range    BP Readings from Last 1 Encounters:  07/14/20 127/79          Passed - Valid encounter within last 6 months    Recent Outpatient Visits           2 months ago No-show for appointment   [place] Ladell Pier, MD   8 months ago Essential hypertension   [place] Ladell Pier, MD   1 year ago Essential hypertension   [place] Ladell Pier, MD   1 year ago Right lower quadrant abdominal pain   [place] Ladell Pier, MD   1 year ago Need for vaccination for Strep pneumoniae  [place], RPH-CPP              Signed Prescriptions Disp Refills   omeprazole (PRILOSEC) 40 MG capsule 90 capsule 0    Sig: TAKE 1 CAPSULE BY MOUTH EVERY DAY     Gastroenterology: Proton Pump Inhibitors Passed - 03/26/2021  7:12 PM      Passed - Valid encounter within last 12 months    Recent Outpatient Visits           2 months ago No-show for appointment   [place] Ladell Pier, MD   8 months ago Essential hypertension   [place], Deborah B, MD   1 year ago Essential hypertension   [place], MD   1 year ago Right lower quadrant abdominal pain   [place], MD   1 year ago Need for vaccination for Strep pneumoniae   [place], RPH-CPP

## 2021-04-02 ENCOUNTER — Other Ambulatory Visit: Payer: Self-pay | Admitting: Internal Medicine

## 2021-04-02 DIAGNOSIS — I10 Essential (primary) hypertension: Secondary | ICD-10-CM

## 2021-04-03 NOTE — Telephone Encounter (Signed)
Requested Prescriptions  Pending Prescriptions Disp Refills  . amLODipine (NORVASC) 5 MG tablet [Pharmacy Med Name: AMLODIPINE BESYLATE 5 MG TAB] 90 tablet     Sig: TAKE 1 TABLET BY MOUTH EVERY DAY     Cardiovascular:  Calcium Channel Blockers Passed - 04/02/2021  2:41 PM      Passed - Last BP in normal range    BP Readings from Last 1 Encounters:  07/14/20 127/79         Passed - Valid encounter within last 6 months    Recent Outpatient Visits          2 months ago No-show for appointment   [place] Ladell Pier, MD   8 months ago Essential hypertension   [place], Deborah B, MD   1 year ago Essential hypertension   [place], MD   1 year ago Right lower quadrant abdominal pain   [place], MD   1 year ago Need for vaccination for Strep pneumoniae   [place], RPH-CPP             . losartan (COZAAR) 50 MG tablet [Pharmacy Med Name: LOSARTAN POTASSIUM 50 MG TAB] 135 tablet     Sig: TAKE 1 AND 1/2 TABLETS DAILY BY MOUTH     Cardiovascular:  Angiotensin Receptor Blockers Failed - 04/02/2021  2:41 PM      Failed - Cr in normal range and within 180 days    Creat  Date Value Ref Range Status  07/02/2016 0.79 0.50 - 0.99 mg/dL Final    Comment:      For patients > or = 67 years of age: The upper reference limit for Creatinine is approximately 13% higher for people identified as African-American.      Creatinine, Ser  Date Value Ref Range Status  03/13/2020 0.89 0.57 - 1.00 mg/dL Final         Failed - K in normal range and within 180 days    Potassium  Date Value Ref Range Status  03/13/2020 4.2 3.5 - 5.2 mmol/L Final         Passed - Patient is not pregnant      Passed - Last BP in normal range    BP Readings from Last 1  Encounters:  07/14/20 127/79         Passed - Valid encounter within last 6 months    Recent Outpatient Visits          2 months ago No-show for appointment   [place] Ladell Pier, MD   8 months ago Essential hypertension   [place], Deborah B, MD   1 year ago Essential hypertension   [place], MD   1 year ago Right lower quadrant abdominal pain   [employer], MD   1 year ago Need for vaccination for Strep pneumoniae   [place], RPH-CPP

## 2021-04-03 NOTE — Telephone Encounter (Signed)
Requested medication (s) are due for refill today: yes  Requested medication (s) are on the active medication list: yes  Last refill:  11/26/20 #135  Future visit scheduled: no - pt leaving practice- has new pt appt at [facility] in December  Notes to clinic:  pt needs refill until new appt or a 30 day RF? Please review.   Requested Prescriptions  Pending Prescriptions Disp Refills   losartan (COZAAR) 50 MG tablet [Pharmacy Med Name: LOSARTAN POTASSIUM 50 MG TAB] 135 tablet     Sig: TAKE 1 AND 1/2 TABLETS DAILY BY MOUTH     Cardiovascular:  Angiotensin Receptor Blockers Failed - 04/02/2021  2:41 PM      Failed - Cr in normal range and within 180 days    Creat  Date Value Ref Range Status  07/02/2016 0.79 0.50 - 0.99 mg/dL Final    Comment:      For patients > or = 68 years of age: The upper reference limit for Creatinine is approximately 13% higher for people identified as African-American.      Creatinine, Ser  Date Value Ref Range Status  03/13/2020 0.89 0.57 - 1.00 mg/dL Final          Failed - K in normal range and within 180 days    Potassium  Date Value Ref Range Status  03/13/2020 4.2 3.5 - 5.2 mmol/L Final          Passed - Patient is not pregnant      Passed - Last BP in normal range    BP Readings from Last 1 Encounters:  07/14/20 127/79          Passed - Valid encounter within last 6 months    Recent Outpatient Visits           2 months ago No-show for appointment   [place] Ladell Pier, MD   8 months ago Essential hypertension   [place], Deborah B, MD   1 year ago Essential hypertension   [place], Deborah B, MD   1 year ago Right lower quadrant abdominal pain   [place], MD   1 year ago Need for vaccination for Strep pneumoniae   [place], Jarome Matin, RPH-CPP              Refused Prescriptions Disp Refills   amLODipine (NORVASC) 5 MG tablet [Pharmacy Med Name: AMLODIPINE BESYLATE 5 MG TAB] 90 tablet     Sig: TAKE 1 TABLET BY MOUTH EVERY DAY     Cardiovascular:  Calcium Channel Blockers Passed - 04/02/2021  2:41 PM      Passed - Last BP in normal range    BP Readings from Last 1 Encounters:  07/14/20 127/79          Passed - Valid encounter within last 6 months    Recent Outpatient Visits           2 months ago No-show for appointment   [place] Ladell Pier, MD   8 months ago Essential hypertension   [place] Ladell Pier, MD   1 year ago Essential hypertension   [place], MD   1 year ago Right lower quadrant abdominal  pain   [place], MD   1 year ago Need for vaccination for Strep pneumoniae   [place], RPH-CPP

## 2021-04-05 ENCOUNTER — Other Ambulatory Visit: Payer: Self-pay

## 2021-04-05 ENCOUNTER — Encounter: Payer: Self-pay | Admitting: Internal Medicine

## 2021-04-05 ENCOUNTER — Ambulatory Visit: Payer: Medicare Other | Attending: Internal Medicine | Admitting: Internal Medicine

## 2021-04-05 VITALS — BP 138/82 | HR 59 | Resp 16 | Ht 62.0 in | Wt 153.6 lb

## 2021-04-05 DIAGNOSIS — E782 Mixed hyperlipidemia: Secondary | ICD-10-CM | POA: Diagnosis not present

## 2021-04-05 DIAGNOSIS — I10 Essential (primary) hypertension: Secondary | ICD-10-CM

## 2021-04-05 DIAGNOSIS — Z23 Encounter for immunization: Secondary | ICD-10-CM

## 2021-04-05 MED ORDER — AMLODIPINE BESYLATE 5 MG PO TABS: 5.0000 mg | ORAL_TABLET | Freq: Every day | ORAL | 4 refills | Status: DC

## 2021-04-05 MED ORDER — ATORVASTATIN CALCIUM 40 MG PO TABS: 40.0000 mg | ORAL_TABLET | Freq: Every day | ORAL | 1 refills | Status: DC

## 2021-04-05 MED ORDER — AMLODIPINE BESYLATE 10 MG PO TABS: 10.0000 mg | ORAL_TABLET | Freq: Every day | ORAL | 1 refills | Status: DC

## 2021-04-05 MED ORDER — LOSARTAN POTASSIUM 50 MG PO TABS: 75.0000 mg | ORAL_TABLET | Freq: Every day | ORAL | 2 refills | Status: DC

## 2021-04-05 NOTE — Progress Notes (Signed)
Pt states she has generalized body aches

## 2021-04-05 NOTE — Progress Notes (Signed)
Patient ID: Jacqueline Orozco, female    DOB: 1950-08-31  MRN: [id number]  CC: chronic ds management  Subjective: Jacqueline Orozco is a 70 y.o. female who presents for chronic ds management.  Ronny Flurry from [employer] is with her and interprets.  Her concerns today include:  Hx of Chronic LBP due to disc ds, HTN, HL, L hydronephrosis s/p pyeloplasty 06/2016, chronic hep B (does not have active infection, abs present 2014), Migraines.  Pt wanted annual physical however last AWV 04/13/2020 so she is not due as yet.  Agrees to be seen today for chronic disease management.  HYPERTENSION Currently taking: see medication list Med Adherence: [x]  Yes and took meds already for today.  On Norvasc and Cozaar  []  No Medication side effects: []  Yes    [x]  No Adherence with salt restriction: [x]  Yes    []  No Home Monitoring?: [x]  Yes    []  No Monitoring Frequency:  once a wk Home BP results range: 138/79 yesterday.  SBP always in 130s SOB? []  Yes    [x]  No Chest Pain?: []  Yes    [x]  No Leg swelling?: []  Yes    [x]  No Headaches?: []  Yes    []  No Dizziness? []  Yes    []  No Comments: Needing refills on her medications.  HL:  taking and tolerating Lipitor.  Needing refill on Lipitor.  HM:  Due for flu and Tdap vaccine Patient Active Problem List   Diagnosis Date Noted   Primary osteoarthritis of both knees 05/28/2019   Motion sickness 08/26/2017   Meniere disease, left 07/25/2017   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   Anterolisthesis 12/24/2016   Allergic contact dermatitis due to adhesives 07/02/2016   Hydronephrosis with ureteropelvic junction (UPJ) obstruction 04/15/2016   Osteoarthritis of right wrist 07/28/2015   Gastric and duodenal angiodysplasia    Seasonal allergies 09/15/2014   IBS (irritable bowel syndrome) 06/20/2014   Lumbar disc disease 09/29/2013   Gastric AVM 12/29/2012   Hx of adenomatous colonic polyps 10/16/2012   Plantar fasciitis, right 05/19/2012   Cervical radiculitis 02/12/2012    Vertigo 05/22/2011   HEPATITIS B, CHRONIC 03/28/2010   HLD (hyperlipidemia) 03/28/2010   Essential hypertension 03/28/2010     Current Outpatient Medications on File Prior to Visit  Medication Sig Dispense Refill   acetaminophen (TYLENOL 8 HOUR) 650 MG CR tablet Take 1 tablet (650 mg total) by mouth every 8 (eight) hours as needed for pain. 90 tablet 1   Calcium Citrate 250 MG TABS Take 2 tablets (500 mg total) by mouth daily. (Patient not taking: Reported on 04/13/2020) 60 tablet 11   calcium citrate-vitamin D 500-400 MG-UNIT chewable tablet Chew 1 tablet by mouth 2 (two) times daily. 180 tablet 1   cetirizine (ZYRTEC) 10 MG tablet Take 1 tablet (10 mg total) by mouth daily. 30 tablet 2   diclofenac sodium (VOLTAREN) 1 % GEL Apply 2 g topically 4 (four) times daily. 100 g 0   dicyclomine (BENTYL) 20 MG tablet TAKE 1 TABLET BY MOUTH 3 TIMES DAILY BEFORE MEALS.  Needs to be seen by PCP prior to next RF request. 90 tablet 0   methocarbamol (ROBAXIN) 500 MG tablet Take 1 tablet (500 mg total) by mouth 2 (two) times daily as needed for muscle spasms. 30 tablet 0   omeprazole (PRILOSEC) 40 MG capsule TAKE 1 CAPSULE BY MOUTH EVERY DAY 90 capsule 0   polyethylene glycol powder (GLYCOLAX/MIRALAX) powder Take 17 grams PO PRN 255 g 4  No current facility-administered medications on file prior to visit.    Allergies  Allergen Reactions   Aspirin Other (See Comments)    stomach pain, stomach bleeding   Penicillins Nausea And Vomiting and Other (See Comments)    Dizzy Has patient had a PCN reaction causing immediate rash, facial/tongue/throat swelling, SOB or lightheadedness with hypotension: No Has patient had a PCN reaction causing severe rash involving mucus membranes or skin necrosis: No Has patient had a PCN reaction that required hospitalization; No Has patient had a PCN reaction occurring within the last 10 years: No If all of the above answers are "NO", then may proceed with Cephalosporin  use.    Latex Itching   Streptomycin Nausea And Vomiting and Rash   Tramadol Nausea Only    Social History   Socioeconomic History   Marital status: Married    Spouse name: Not on file   Number of children: 6   Years of education: 12    Highest education level: Not on file  Occupational History   Occupation: Unemployed   Tobacco Use   Smoking status: Never   Smokeless tobacco: Never  Substance and Sexual Activity   Alcohol use: Yes    Comment: occasional wine   Drug use: No   Sexual activity: Yes    Birth control/protection: None  Other Topics Concern   Not on file  Social History Narrative   From [place].   Lived in [place] since 1994.    Live with husband.   6 adult children.    Speaks some English and reads some  [place].    Caffeine use: Coffee daily   Right handed   Social Determinants of Health   Financial Resource Strain: Not on file  Food Insecurity: Not on file  Transportation Needs: Not on file  Physical Activity: Not on file  Stress: Not on file  Social Connections: Not on file  Intimate Partner Violence: Not on file    Family History  Problem Relation Age of Onset   Hypertension Mother    Stomach cancer Father    Liver disease Maternal Uncle    Lung cancer Maternal Grandmother    Breast cancer Neg Hx     Past Surgical History:  Procedure Laterality Date   COLONOSCOPY WITH PROPOFOL N/A 01/03/2015   Procedure: COLONOSCOPY WITH PROPOFOL;  Surgeon: Jerene Bears, MD;  Location: [facility];  Service: Gastroenterology;  Laterality: N/A;   CYSTOSCOPY W/ URETERAL STENT PLACEMENT Left 06/17/2016   Procedure: CYSTOSCOPY WITH RETROGRADE PYELOGRAM/URETERAL STENT PLACEMENT;  Surgeon: Raynelle Bring, MD;  Location: [facility];  Service: Urology;  Laterality: Left;   ESOPHAGOGASTRODUODENOSCOPY (EGD) WITH PROPOFOL N/A 01/03/2015   Procedure: ESOPHAGOGASTRODUODENOSCOPY (EGD) WITH PROPOFOL;  Surgeon: Jerene Bears, MD;  Location: [facility];  Service: Gastroenterology;   Laterality: N/A;   ESOPHAGOGASTRODUODENOSCOPY ENDOSCOPY     several times   HOT HEMOSTASIS N/A 01/03/2015   Procedure: HOT HEMOSTASIS (ARGON PLASMA COAGULATION/BICAP);  Surgeon: Jerene Bears, MD;  Location: [facility];  Service: Gastroenterology;  Laterality: N/A;   NO PAST SURGERIES     ROBOT ASSISTED PYELOPLASTY Left 06/17/2016   Procedure: XI ROBOTIC ASSISTED PYELOPLASTY;  Surgeon: Raynelle Bring, MD;  Location: [facility];  Service: Urology;  Laterality: Left;    ROS: Review of Systems Negative except as stated above  PHYSICAL EXAM: BP 138/82   Pulse (!) 59   Resp 16   Ht 5\' 2"  (1.575 m)   Wt 153 lb 9.6 oz (69.7 kg)  SpO2 100%   BMI 28.09 kg/m   Physical Exam  General appearance - alert, well appearing, older female and in no distress Mental status - normal mood, behavior, speech, dress, motor activity, and thought processes Mouth - mucous membranes moist, pharynx normal without lesions Neck - supple, no significant adenopathy Chest - clear to auscultation, no wheezes, rales or rhonchi, symmetric air entry Heart - normal rate, regular rhythm, normal S1, S2, no murmurs, rubs, clicks or gallops Extremities - peripheral pulses normal, no pedal edema, no clubbing or cyanosis   CMP Latest Ref Rng & Units 03/13/2020 05/28/2019 07/25/2017  Glucose 65 - 99 mg/dL 98 94 -  BUN 8 - 27 mg/dL 15 15 -  Creatinine 0.57 - 1.00 mg/dL 0.89 0.60 -  Sodium 134 - 144 mmol/L 142 140 -  Potassium 3.5 - 5.2 mmol/L 4.2 3.8 3.6  Chloride 96 - 106 mmol/L 103 103 -  CO2 20 - 29 mmol/L 24 23 -  Calcium 8.7 - 10.3 mg/dL 10.2 9.5 -  Total Protein 6.0 - 8.5 g/dL 8.0 7.2 -  Total Bilirubin 0.0 - 1.2 mg/dL 0.8 0.4 -  Alkaline Phos 44 - 121 IU/L 93 84 -  AST 0 - 40 IU/L 23 16 -  ALT 0 - 32 IU/L 18 17 -   Lipid Panel     Component Value Date/Time   CHOL 202 (H) 07/18/2020 0913   TRIG 100 07/18/2020 0913   HDL 71 07/18/2020 0913   CHOLHDL 2.8 07/18/2020 0913   CHOLHDL 2.7 11/23/2015 1203   VLDL 24  11/23/2015 1203   LDLCALC 113 (H) 07/18/2020 0913   LDLDIRECT 141.5 03/27/2010 0953    CBC    Component Value Date/Time   WBC 5.4 03/13/2020 0930   WBC 8.0 07/15/2017 1805   RBC 4.85 03/13/2020 0930   RBC 5.04 07/15/2017 1805   HGB 13.5 03/13/2020 0930   HCT 40.6 03/13/2020 0930   PLT 213 03/13/2020 0930   MCV 84 03/13/2020 0930   MCH 27.8 03/13/2020 0930   MCH 26.4 07/15/2017 1805   MCHC 33.3 03/13/2020 0930   MCHC 33.3 07/15/2017 1805   RDW 13.2 03/13/2020 0930   LYMPHSABS 2.1 10/02/2017 1128   MONOABS 0.3 01/19/2017 1615   EOSABS 0.2 10/02/2017 1128   BASOSABS 0.0 10/02/2017 1128    ASSESSMENT AND PLAN: 1. Essential hypertension Not at goal.  I recommend increasing amlodipine to 10 mg daily. - losartan (COZAAR) 50 MG tablet; Take 1.5 tablets (75 mg total) by mouth daily.  Dispense: 135 tablet; Refill: 2 - CBC - Comprehensive metabolic panel - amLODipine (NORVASC) 10 MG tablet; Take 1 tablet (10 mg total) by mouth daily.  Dispense: 90 tablet; Refill: 1  2. Mixed hyperlipidemia Continue atorvastatin.  We will check lipid profile today. - atorvastatin (LIPITOR) 40 MG tablet; Take 1 tablet (40 mg total) by mouth daily.  Dispense: 90 tablet; Refill: 1 - Lipid panel  3. Need for diphtheria-tetanus-pertussis (Tdap) vaccine Given today.  4. Need for immunization against influenza - Flu Vaccine QUAD 52mo+IM (Fluarix, Fluzone & Alfiuria Quad PF)    Patient was given the opportunity to ask questions.  Patient verbalized understanding of the plan and was able to repeat key elements of the plan.   Orders Placed This Encounter  Procedures   Flu Vaccine QUAD 25mo+IM (Fluarix, Fluzone & Alfiuria Quad PF)   Tdap vaccine greater than or equal to 7yo IM   CBC   Comprehensive metabolic panel   Lipid  panel     Requested Prescriptions   Signed Prescriptions Disp Refills   losartan (COZAAR) 50 MG tablet 135 tablet 2    Sig: Take 1.5 tablets (75 mg total) by mouth daily.    amLODipine (NORVASC) 5 MG tablet 90 tablet 4    Sig: Take 1 tablet (5 mg total) by mouth daily.   atorvastatin (LIPITOR) 40 MG tablet 90 tablet 1    Sig: Take 1 tablet (40 mg total) by mouth daily.    Return in about 6 weeks (around 05/17/2021) for for AWV.  Karle Plumber, MD, FACP

## 2021-04-06 LAB — COMPREHENSIVE METABOLIC PANEL
ALT: 19 IU/L (ref 0–32)
AST: 17 IU/L (ref 0–40)
Albumin/Globulin Ratio: 1.5 (ref 1.2–2.2)
Albumin: 4.5 g/dL (ref 3.8–4.8)
Alkaline Phosphatase: 89 IU/L (ref 44–121)
BUN/Creatinine Ratio: 13 (ref 12–28)
BUN: 10 mg/dL (ref 8–27)
Bilirubin Total: 0.6 mg/dL (ref 0.0–1.2)
CO2: 24 mmol/L (ref 20–29)
Calcium: 9.7 mg/dL (ref 8.7–10.3)
Chloride: 104 mmol/L (ref 96–106)
Creatinine, Ser: 0.78 mg/dL (ref 0.57–1.00)
Globulin, Total: 3.1 g/dL (ref 1.5–4.5)
Glucose: 85 mg/dL (ref 70–99)
Potassium: 3.8 mmol/L (ref 3.5–5.2)
Sodium: 141 mmol/L (ref 134–144)
Total Protein: 7.6 g/dL (ref 6.0–8.5)
eGFR: 83 mL/min/{1.73_m2} (ref >59)

## 2021-04-06 LAB — CBC
Hematocrit: 39.4 % (ref 34.0–46.6)
Hemoglobin: 13.2 g/dL (ref 11.1–15.9)
MCH: 26.7 pg (ref 26.6–33.0)
MCHC: 33.5 g/dL (ref 31.5–35.7)
MCV: 80 fL (ref 79–97)
Platelets: 209 10*3/uL (ref 150–450)
RBC: 4.94 x10E6/uL (ref 3.77–5.28)
RDW: 13.2 % (ref 11.7–15.4)
WBC: 7 10*3/uL (ref 3.4–10.8)

## 2021-04-06 LAB — LIPID PANEL
Chol/HDL Ratio: 2.5 ratio (ref 0.0–4.4)
Cholesterol, Total: 172 mg/dL (ref 100–199)
HDL: 68 mg/dL (ref >39)
LDL Chol Calc (NIH): 79 mg/dL (ref 0–99)
Triglycerides: 150 mg/dL — ABNORMAL HIGH (ref 0–149)
VLDL Cholesterol Cal: 25 mg/dL (ref 5–40)

## 2021-04-09 ENCOUNTER — Telehealth: Payer: Self-pay

## 2021-04-09 NOTE — Telephone Encounter (Signed)
Contacted pt to go over lab results pt didn't answer lvm   Sent a CRM and forward labs to NT to give pt labs when they call back   

## 2021-05-17 ENCOUNTER — Encounter: Payer: Self-pay | Admitting: Pharmacist

## 2021-05-17 ENCOUNTER — Ambulatory Visit: Payer: Medicare Other | Attending: Family Medicine | Admitting: Pharmacist

## 2021-05-17 ENCOUNTER — Other Ambulatory Visit: Payer: Self-pay

## 2021-05-17 VITALS — BP 148/80 | HR 73 | Temp 98.3°F | Ht 63.0 in | Wt 155.0 lb

## 2021-05-17 DIAGNOSIS — Z23 Encounter for immunization: Secondary | ICD-10-CM

## 2021-05-17 DIAGNOSIS — K589 Irritable bowel syndrome without diarrhea: Secondary | ICD-10-CM

## 2021-05-17 DIAGNOSIS — Z0000 Encounter for general adult medical examination without abnormal findings: Secondary | ICD-10-CM

## 2021-05-17 MED ORDER — DICYCLOMINE HCL 20 MG PO TABS
20.0000 mg | ORAL_TABLET | Freq: Three times a day (TID) | ORAL | 0 refills | Status: DC
Start: 2021-05-17 — End: 2021-08-14

## 2021-05-17 NOTE — Progress Notes (Addendum)
Subjective:   Jacqueline Orozco is a 70 y.o. female who presents for Medicare Annual (Subsequent) preventive examination.     Objective:    Today's Vitals   05/17/21 1136 05/17/21 1137  BP: (!) 148/80   Pulse: 73   Temp: 98.3 F (36.8 C)   SpO2: 98%   Weight: 155 lb (70.3 kg)   Height: 5\' 3"  (1.6 m)   PainSc: 7  7   PainLoc: Hand    Body mass index is 27.46 kg/m.  Advanced Directives 05/17/2021 04/13/2020 07/15/2017 01/19/2017 12/23/2016 10/22/2016 09/10/2016  Does Patient Have a Medical Advance Directive? No No No No No No No  Would patient like information on creating a medical advance directive? No - Patient declined No - Patient declined No - Patient declined - No - Patient declined - -    Current Medications (verified) Outpatient Encounter Medications as of 05/17/2021  Medication Sig   acetaminophen (TYLENOL 8 HOUR) 650 MG CR tablet Take 1 tablet (650 mg total) by mouth every 8 (eight) hours as needed for pain.   amLODipine (NORVASC) 10 MG tablet Take 1 tablet (10 mg total) by mouth daily.   atorvastatin (LIPITOR) 40 MG tablet Take 1 tablet (40 mg total) by mouth daily.   diclofenac sodium (VOLTAREN) 1 % GEL Apply 2 g topically 4 (four) times daily.   losartan (COZAAR) 50 MG tablet Take 1.5 tablets (75 mg total) by mouth daily.   omeprazole (PRILOSEC) 40 MG capsule TAKE 1 CAPSULE BY MOUTH EVERY DAY   polyethylene glycol powder (GLYCOLAX/MIRALAX) powder Take 17 grams PO PRN   [DISCONTINUED] methocarbamol (ROBAXIN) 500 MG tablet Take 1 tablet (500 mg total) by mouth 2 (two) times daily as needed for muscle spasms.   cetirizine (ZYRTEC) 10 MG tablet Take 1 tablet (10 mg total) by mouth daily.   dicyclomine (BENTYL) 20 MG tablet Take 1 tablet (20 mg total) by mouth 3 (three) times daily before meals. (Patient not taking: Reported on 05/17/2021)   [DISCONTINUED] Calcium Citrate 250 MG TABS Take 2 tablets (500 mg total) by mouth daily. (Patient not taking: Reported on 04/13/2020)    [DISCONTINUED] calcium citrate-vitamin D 500-400 MG-UNIT chewable tablet Chew 1 tablet by mouth 2 (two) times daily. (Patient not taking: Reported on 05/17/2021)   [DISCONTINUED] dicyclomine (BENTYL) 20 MG tablet TAKE 1 TABLET BY MOUTH 3 TIMES DAILY BEFORE MEALS.  Needs to be seen by PCP prior to next RF request.   No facility-administered encounter medications on file as of 05/17/2021.    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   Blood transfusion without reported diagnosis    Cold sore    Fatty liver    Gastric AVM    GERD (gastroesophageal reflux disease)    HEPATITIS B, CHRONIC 03/28/2010   HYPERLIPIDEMIA 03/28/2010   HYPERTENSION 03/28/2010   IBS (irritable bowel syndrome)    Internal hemorrhoids    Lumbar disc disease 09/29/2013   PONV (postoperative nausea and vomiting)    headache also   Past Surgical History:  Procedure Laterality Date   COLONOSCOPY WITH PROPOFOL N/A 01/03/2015   Procedure: COLONOSCOPY WITH PROPOFOL;  Surgeon: Jerene Bears, MD;  Location: [facility];  Service: Gastroenterology;  Laterality: N/A;   CYSTOSCOPY W/ URETERAL STENT PLACEMENT Left 06/17/2016   Procedure: CYSTOSCOPY WITH RETROGRADE PYELOGRAM/URETERAL STENT PLACEMENT;  Surgeon: Raynelle Bring, MD;  Location: [facility];  Service: Urology;  Laterality: Left;   ESOPHAGOGASTRODUODENOSCOPY (EGD) WITH PROPOFOL N/A 01/03/2015   Procedure: ESOPHAGOGASTRODUODENOSCOPY (EGD) WITH PROPOFOL;  Surgeon: Jerene Bears, MD;  Location: [facility];  Service: Gastroenterology;  Laterality: N/A;   ESOPHAGOGASTRODUODENOSCOPY ENDOSCOPY     several times   HOT HEMOSTASIS N/A 01/03/2015   Procedure: HOT HEMOSTASIS (ARGON PLASMA COAGULATION/BICAP);  Surgeon: Jerene Bears, MD;  Location: [facility];  Service: Gastroenterology;  Laterality: N/A;   NO PAST SURGERIES      ROBOT ASSISTED PYELOPLASTY Left 06/17/2016   Procedure: XI ROBOTIC ASSISTED PYELOPLASTY;  Surgeon: Raynelle Bring, MD;  Location: [facility];  Service: Urology;  Laterality: Left;   Family History  Problem Relation Age of Onset   Hypertension Mother    Stomach cancer Father    Liver disease Maternal Uncle    Lung cancer Maternal Grandmother    Breast cancer Neg Hx    Social History   Socioeconomic History   Marital status: Married    Spouse name: Not on file   Number of children: 6   Years of education: 12    Highest education level: Not on file  Occupational History   Occupation: Unemployed   Tobacco Use   Smoking status: Never   Smokeless tobacco: Never  Substance and Sexual Activity   Alcohol use: Yes    Comment: occasional wine   Drug use: No   Sexual activity: Yes    Birth control/protection: None  Other Topics Concern   Not on file  Social History Narrative   From [place].   Lived in [place] since 1994.    Live with husband.   6 adult children.    Speaks some English and reads some  [place].    Caffeine use: Coffee daily   Right handed   Social Determinants of Health   Financial Resource Strain: Not on file  Food Insecurity: Not on file  Transportation Needs: Not on file  Physical Activity: Not on file  Stress: Not on file  Social Connections: Not on file    Tobacco Counseling Counseling given: Not Answered   Clinical Intake:  Pre-visit preparation completed: No  Pain : 0-10 Pain Score: 7  Pain Type: Chronic pain Pain Location: Hand Pain Orientation: Right, Left Pain Descriptors / Indicators: Aching, Sharp Pain Onset: More than a month ago Pain Frequency: Constant Pain Relieving Factors: Rest Effect of Pain on Daily Activities: Cannot carry heavy objects  Pain Relieving Factors: Rest  Nutritional Status: BMI 25 -29 Overweight Nutritional Risks: None Diabetes: No  How often do you need to have someone help you when you read instructions,  pamphlets, or other written materials from your doctor or pharmacy?: 3 - Sometimes  Diabetic? No  Interpreter Needed?: Yes Patient Declined Interpreter : No Patient signed [place] waiver: No      Activities of Daily Living In your present state of health, do you have any difficulty performing the following activities: 05/17/2021  Hearing? N  Vision? N  Difficulty concentrating or making decisions? N  Walking or climbing stairs? N  Dressing or bathing? N  Doing errands, shopping? N  Preparing Food and eating ? N  Using the Toilet? N  In the past six months, have you accidently leaked urine? N  Do you have problems with loss of bowel control? N  Managing your Medications? N  Managing your Finances? N  Housekeeping or managing your Housekeeping? N  Some recent data  might be hidden    Patient Care Team: Ladell Pier, MD as PCP - General (Internal Medicine)  Indicate any recent Medical Services you may have received from other than [facility] providers in the past year (date may be approximate).     Assessment:   This is a routine wellness examination for Jacqueline Orozco.  Hearing/Vision screen No results found.  Dietary issues and exercise activities discussed: Current Exercise Habits: The patient does not participate in regular exercise at present, Exercise limited by: orthopedic condition(s)   Goals Addressed   None   Depression Screen PHQ 2/9 Scores 05/17/2021 04/05/2021 07/14/2020 04/13/2020 03/13/2020 11/02/2019 03/30/2018  PHQ - 2 Score 0 0 0 3 0 3 2  PHQ- 9 Score - - - 4 - 5 3    Fall Risk Fall Risk  05/17/2021 04/05/2021 07/14/2020 04/13/2020 03/13/2020  Falls in the past year? 0 0 0 0 0  Number falls in past yr: 0 0 0 0 0  Injury with Fall? 0 0 0 0 0  Risk for fall due to : - No Fall Risks - - -  Follow up Falls evaluation completed;Education provided;Falls prevention discussed - - - -    FALL RISK PREVENTION PERTAINING TO THE HOME:  Any stairs in or around the home? No   If so, are there any without handrails? No  Home free of loose throw rugs in walkways, pet beds, electrical cords, etc? Yes  Adequate lighting in your home to reduce risk of falls? Yes   ASSISTIVE DEVICES UTILIZED TO PREVENT FALLS:  Life alert? No  Use of a cane, walker or w/c? Yes  Grab bars in the bathroom? No  Shower chair or bench in shower? No  - Requests one. Rx for shower chair. Endorses 7/10 L knee pain that is worse with prolonged standing. Walks with cane at home occasionally and has some impact on her ADLs.   Elevated toilet seat or a handicapped toilet? No   TIMED UP AND GO:  Was the test performed? Yes .  Length of time to ambulate 10 feet: 8 sec.   Gait slow and steady without use of assistive device  Cognitive Function: MMSE - Mini Mental State Exam 05/17/2021 04/13/2020  Orientation to time 5 4  Orientation to Place 5 5  Registration 3 3  Attention/ Calculation 5 0  Recall 3 3  Language- name 2 objects 2 2  Language- repeat 1 1  Language- follow 3 step command 3 0  Language- read & follow direction 1 0  Write a sentence 1 1  Copy design 1 1  Total score 30 20        Immunizations Immunization History  Administered Date(s) Administered   Influenza Split 04/01/2011, 03/12/2012   Influenza, High Dose Seasonal PF 03/23/2018   Influenza,inj,Quad PF,6+ Mos 04/11/2015, 03/11/2016, 03/13/2020, 04/05/2021   Influenza-Unspecified 03/27/2014   Moderna Sars-Covid-2 Vaccination 05/02/2020, 05/30/2020   Pneumococcal Conjugate-13 04/07/2018   Pneumococcal Polysaccharide-23 05/28/2019   Tdap 04/01/2011, 04/05/2021   Zoster Recombinat (Shingrix) 04/13/2020    TDAP status: Up to date  Flu Vaccine status: Up to date  Pneumococcal vaccine status: Up to date  Covid-19 vaccine status: Information provided on how to obtain vaccines.   Qualifies for Shingles Vaccine? Yes   Zostavax completed No   Shingrix Completed?: Yes  Screening Tests Health Maintenance   Topic Date Due   Zoster Vaccines- Shingrix (2 of 2) 06/08/2020   COVID-19 Vaccine (3 - Booster for Moderna series) 07/25/2020  MAMMOGRAM  08/23/2022   COLONOSCOPY (Pts 45-5yrs Insurance coverage will need to be confirmed)  01/02/2025   TETANUS/TDAP  04/06/2031   Pneumonia Vaccine 10+ Years old  Completed   INFLUENZA VACCINE  Completed   DEXA SCAN  Completed   Hepatitis C Screening  Completed   HPV VACCINES  Aged Out    Health Maintenance  Health Maintenance Due  Topic Date Due   Zoster Vaccines- Shingrix (2 of 2) 06/08/2020   COVID-19 Vaccine (3 - Booster for Moderna series) 07/25/2020    Colorectal cancer screening: Type of screening: Colonoscopy. Completed 2016. Repeat every 10 years  Mammogram status: Completed 08/22/2020. Repeat every year  Bone Density status: Completed 01/26/2019. Results reflect: Bone density results: OSTEOPENIA. Repeat every 2 years.  Lung Cancer Screening: (Low Dose CT Chest recommended if Age 56-80 years, 30 pack-year currently smoking OR have quit w/in 15years.) does not qualify.   Lung Cancer Screening Referral: None needed  Additional Screening:  Hepatitis C Screening: does qualify; Completed 05/05/2020  Vision Screening: Recommended annual ophthalmology exams for early detection of glaucoma and other disorders of the eye. Is the patient up to date with their annual eye exam?  No  Who is the provider or what is the name of the office in which the patient attends annual eye exams? None If pt is not established with a provider, would they like to be referred to a provider to establish care? Yes .   Dental Screening: Recommended annual dental exams for proper oral hygiene  Community Resource Referral / Chronic Care Management: CRR required this visit?  No   CCM required this visit?  No      Plan:     I have personally reviewed and noted the following in the patient's chart:   Medical and social history Use of alcohol, tobacco or  illicit drugs  Current medications and supplements including opioid prescriptions.  Functional ability and status Nutritional status Physical activity Advanced directives List of other physicians Hospitalizations, surgeries, and ER visits in previous 12 months Vitals Screenings to include cognitive, depression, and falls Referrals and appointments  In addition, I have reviewed and discussed with patient certain preventive protocols, quality metrics, and best practice recommendations. A written personalized care plan for preventive services as well as general preventive health recommendations were provided to patient.     Tresa Endo, RPH-CPP   05/17/2021

## 2021-05-23 ENCOUNTER — Telehealth: Payer: Self-pay | Admitting: Internal Medicine

## 2021-05-23 DIAGNOSIS — M170 Bilateral primary osteoarthritis of knee: Secondary | ICD-10-CM

## 2021-05-23 DIAGNOSIS — Z0100 Encounter for examination of eyes and vision without abnormal findings: Secondary | ICD-10-CM

## 2021-05-23 NOTE — Telephone Encounter (Signed)
-----   Message from Tresa Endo, RPH-CPP sent at 05/17/2021 11:48 AM EST ----- Hey Dr. Wynetta Emery,   Saw this pt for her AWV. She was requesting a couple of things I couldn't order.   1. Eye MD referral  2. Rx for shower chair. Endorses 7/10 L knee pain that is worse with prolonged standing. Walks with cane at home occasionally and has some impact on her ADLs.   3. Ortho MD referral as patient endorses significant knee and bilateral wrist pain.   I was able to close her Shingrix gap and all other things looked good. Encouraged her to get her covid booster. She was very pleasant.   Thanks for including me,   Lurena Joiner

## 2021-05-24 ENCOUNTER — Ambulatory Visit: Payer: Self-pay | Admitting: Internal Medicine

## 2021-05-24 NOTE — Telephone Encounter (Signed)
Pt requesting referral information for ophthalmology information is not on referral (location).  Pt had not listened to the vm that was left I advised her to listen as well she may find more details.   Please advise.

## 2021-05-24 NOTE — Telephone Encounter (Signed)
Contacted pt and left a detailed vm making pt aware that referrals were placed for ortho and eye doctor and that I will be faxing rx for shower chair to [facility] and if she has any questions or concerns to give [place] a call

## 2021-05-29 ENCOUNTER — Ambulatory Visit: Payer: Self-pay | Admitting: *Deleted

## 2021-05-29 NOTE — Telephone Encounter (Signed)
Will forward to provider  

## 2021-05-29 NOTE — Telephone Encounter (Signed)
Chief Complaint: chest tightness from coughing , started Monday  Symptoms: cough, sore throat, fever, chills, headache  right side , lightheaded pain in right ear  Frequency: sx started Saturday 05/26/21 Pertinent Negatives: Patient denies difficulty breathing or shortness of breath.  Disposition: [] ED /[x] Urgent Care (no appt availability in office) / [] Appointment(In office/virtual)/ []  [facility]/ [] Home Care/ [] Refused Recommended Disposition  Additional Notes:  No available appt. Has not tested for covid. Recommended UC or ED due to increasing sx chest tightness with coughing. R/O pnm.       Reason for Disposition  Earache is present  Answer Assessment - Initial Assessment Questions 1. ONSET: "When did the cough begin?"      Saturday 05/26/21 2. SEVERITY: "How bad is the cough today?"      Causes chest tightness  3. SPUTUM: "Describe the color of your sputum" (none, dry cough; clear, white, yellow, green)     none 4. HEMOPTYSIS: "Are you coughing up any blood?" If so ask: "How much?" (flecks, streaks, tablespoons, etc.)     na 5. DIFFICULTY BREATHING: "Are you having difficulty breathing?" If Yes, ask: "How bad is it?" (e.g., mild, moderate, severe)    - MILD: No SOB at rest, mild SOB with walking, speaks normally in sentences, can lie down, no retractions, pulse < 100.    - MODERATE: SOB at rest, SOB with minimal exertion and prefers to sit, cannot lie down flat, speaks in phrases, mild retractions, audible wheezing, pulse 100-120.    - SEVERE: Very SOB at rest, speaks in single words, struggling to breathe, sitting hunched forward, retractions, pulse > 120      denies 6. FEVER: "Do you have a fever?" If Yes, ask: "What is your temperature, how was it measured, and when did it start?"     Yes no report of being measured  7. CARDIAC HISTORY: "Do you have any history of heart disease?" (e.g., heart attack, congestive heart failure)      na 8. LUNG HISTORY: "Do  you have any history of lung disease?"  (e.g., pulmonary embolus, asthma, emphysema)     na 9. PE RISK FACTORS: "Do you have a history of blood clots?" (or: recent major surgery, recent prolonged travel, bedridden)     na 10. OTHER SYMPTOMS: "Do you have any other symptoms?" (e.g., runny nose, wheezing, chest pain)       Cough, chest tightness, sore throat , chills  11. PREGNANCY: "Is there any chance you are pregnant?" "When was your last menstrual period?"       na 12. TRAVEL: "Have you traveled out of the country in the last month?" (e.g., travel history, exposures)       na  Answer Assessment - Initial Assessment Questions 1. LOCATION: "Where does it hurt?"       Chest  2. RADIATION: "Does the pain go anywhere else?" (e.g., into neck, jaw, arms, back)     Chest tightness when coughing  3. ONSET: "When did the chest pain begin?" (Minutes, hours or days)      Monday 4. PATTERN "Does the pain come and go, or has it been constant since it started?"  "Does it get worse with exertion?"      With coughing  5. DURATION: "How long does it last" (e.g., seconds, minutes, hours)     na 6. SEVERITY: "How bad is the pain?"  (e.g., Scale 1-10; mild, moderate, or severe)    - MILD (1-3): doesn't interfere with  normal activities     - MODERATE (4-7): interferes with normal activities or awakens from sleep    - SEVERE (8-10): excruciating pain, unable to do any normal activities       na 7. CARDIAC RISK FACTORS: "Do you have any history of heart problems or risk factors for heart disease?" (e.g., angina, prior heart attack; diabetes, high blood pressure, high cholesterol, smoker, or strong family history of heart disease)     na 8. PULMONARY RISK FACTORS: "Do you have any history of lung disease?"  (e.g., blood clots in lung, asthma, emphysema, birth control pills)     na 9. CAUSE: "What do you think is causing the chest pain?"     From flu like sx 10. OTHER SYMPTOMS: "Do you have any other  symptoms?" (e.g., dizziness, nausea, vomiting, sweating, fever, difficulty breathing, cough)       Lightheaded , denies shortness of breath 11. PREGNANCY: "Is there any chance you are pregnant?" "When was your last menstrual period?"       na  Protocols used: Chest Pain-A-AH, Cough - Acute Non-Productive-A-AH

## 2021-05-29 NOTE — Telephone Encounter (Signed)
I have seen the RNs triage note.  I agree with sending the patient to urgent care for evaluation of her symptoms.

## 2021-06-05 ENCOUNTER — Telehealth: Payer: Self-pay | Admitting: Internal Medicine

## 2021-06-05 NOTE — Telephone Encounter (Signed)
Contacted pt to schedule an appt pt didn't answer lvm

## 2021-06-05 NOTE — Telephone Encounter (Signed)
Will forward to provider  

## 2021-06-05 NOTE — Telephone Encounter (Signed)
Pt called into office to request Rx refill for antibiotic for coughting that she took last year.Advised 24-48 hrs

## 2021-06-06 ENCOUNTER — Other Ambulatory Visit: Payer: Self-pay

## 2021-06-06 ENCOUNTER — Ambulatory Visit: Payer: Medicare Other | Attending: Physician Assistant | Admitting: Physician Assistant

## 2021-06-06 ENCOUNTER — Encounter: Payer: Self-pay | Admitting: Physician Assistant

## 2021-06-06 DIAGNOSIS — U099 Post covid-19 condition, unspecified: Secondary | ICD-10-CM

## 2021-06-06 DIAGNOSIS — J069 Acute upper respiratory infection, unspecified: Secondary | ICD-10-CM | POA: Diagnosis not present

## 2021-06-06 DIAGNOSIS — Z758 Other problems related to medical facilities and other health care: Secondary | ICD-10-CM

## 2021-06-06 DIAGNOSIS — Z789 Other specified health status: Secondary | ICD-10-CM

## 2021-06-06 MED ORDER — AZITHROMYCIN 250 MG PO TABS: ORAL_TABLET | ORAL | 0 refills | Status: AC

## 2021-06-06 MED ORDER — PROMETHAZINE-DM 6.25-15 MG/5ML PO SYRP
5.0000 mL | ORAL_SOLUTION | Freq: Four times a day (QID) | ORAL | 0 refills | Status: DC | PRN
Start: 2021-06-06 — End: 2022-06-15

## 2021-06-06 NOTE — Progress Notes (Signed)
Virtual Visit via Telephone Note  I connected with Jacqueline Orozco on 06/06/21 at  9:50 AM EST by telephone and verified that I am speaking with the correct person using two identifiers.  Location: Patient: home Provider: [facility] office Misaen through language services helped from his personal phone [phone]   I discussed the limitations, risks, security and privacy concerns of performing an evaluation and management service by telephone and the availability of in person appointments. I also discussed with the patient that there may be a patient responsible charge related to this service. The patient expressed understanding and agreed to proceed.   History of Present Illness:  patient first started feeling bad about 5-6 days ago with cough, body aches, fever, runny nose.  She tested positive for Covid 2 days ago at home.  She has started to feel better except for the cough and green phlegm.  No more fever or body aches. No respiratory distress.      Observations/Objective:  NAD but does cough periodically on call.  A&Ox3   Assessment and Plan: 1. Upper respiratory tract infection, unspecified type Fluids, rest.  If anything worsens (respiratory distress-go to ED)or does not continue to improve (UC).   - azithromycin (ZITHROMAX) 250 MG tablet; Take 2 tablets on day 1, then 1 tablet daily on days 2 through 5  Dispense: 6 tablet; Refill: 0 - promethazine-dextromethorphan (PROMETHAZINE-DM) 6.25-15 MG/5ML syrup; Take 5 mLs by mouth 4 (four) times daily as needed for cough.  Dispense: 118 mL; Refill: 0  2. Post covid-19 condition, unspecified - azithromycin (ZITHROMAX) 250 MG tablet; Take 2 tablets on day 1, then 1 tablet daily on days 2 through 5  Dispense: 6 tablet; Refill: 0  3. Language barrier [place] interpreters(Lana gave me number to the interpreter [place]isa[place] who assisted-his information is listed above) used and additional time performing visit was required.     Follow Up  Instructions: prn   I discussed the assessment and treatment plan with the patient. The patient was provided an opportunity to ask questions and all were answered. The patient agreed with the plan and demonstrated an understanding of the instructions.   The patient was advised to call back or seek an in-person evaluation if the symptoms worsen or if the condition fails to improve as anticipated.  I provided 16 minutes of non-face-to-face time during this encounter.   Freeman Caldron, PA-C  Patient ID: Jacqueline Orozco, female   DOB: 06-Apr-1951, 70 y.o.   MRN: [id number]

## 2021-06-21 DIAGNOSIS — M1711 Unilateral primary osteoarthritis, right knee: Secondary | ICD-10-CM | POA: Diagnosis not present

## 2021-06-21 DIAGNOSIS — M25562 Pain in left knee: Secondary | ICD-10-CM | POA: Diagnosis not present

## 2021-06-21 DIAGNOSIS — M1712 Unilateral primary osteoarthritis, left knee: Secondary | ICD-10-CM | POA: Diagnosis not present

## 2021-06-21 DIAGNOSIS — M25561 Pain in right knee: Secondary | ICD-10-CM | POA: Diagnosis not present

## 2021-06-28 ENCOUNTER — Other Ambulatory Visit: Payer: Self-pay | Admitting: Internal Medicine

## 2021-06-28 DIAGNOSIS — K219 Gastro-esophageal reflux disease without esophagitis: Secondary | ICD-10-CM

## 2021-06-28 NOTE — Telephone Encounter (Signed)
Requested Prescriptions  Pending Prescriptions Disp Refills   omeprazole (PRILOSEC) 40 MG capsule [Pharmacy Med Name: OMEPRAZOLE DR 40 MG CAPSULE] 90 capsule 0    Sig: TAKE 1 CAPSULE BY MOUTH EVERY DAY     Gastroenterology: Proton Pump Inhibitors Passed - 06/28/2021  3:09 AM      Passed - Valid encounter within last 12 months    Recent Outpatient Visits          3 weeks ago Upper respiratory tract infection, unspecified type   [place], [place]   1 month ago Encounter for [employer] annual wellness exam   [place], RPH-CPP   2 months ago Need for influenza vaccination   [place] Ladell Pier, MD   5 months ago No-show for appointment   [place] Ladell Pier, MD   11 months ago Essential hypertension   [place] Ladell Pier, MD

## 2021-07-01 DIAGNOSIS — I10 Essential (primary) hypertension: Secondary | ICD-10-CM | POA: Diagnosis not present

## 2021-07-01 DIAGNOSIS — I499 Cardiac arrhythmia, unspecified: Secondary | ICD-10-CM | POA: Diagnosis not present

## 2021-07-01 DIAGNOSIS — K5900 Constipation, unspecified: Secondary | ICD-10-CM | POA: Diagnosis not present

## 2021-07-01 DIAGNOSIS — G8929 Other chronic pain: Secondary | ICD-10-CM | POA: Diagnosis not present

## 2021-07-01 DIAGNOSIS — J301 Allergic rhinitis due to pollen: Secondary | ICD-10-CM | POA: Diagnosis not present

## 2021-07-01 DIAGNOSIS — Z008 Encounter for other general examination: Secondary | ICD-10-CM | POA: Diagnosis not present

## 2021-07-01 DIAGNOSIS — M1990 Unspecified osteoarthritis, unspecified site: Secondary | ICD-10-CM | POA: Diagnosis not present

## 2021-07-01 DIAGNOSIS — M792 Neuralgia and neuritis, unspecified: Secondary | ICD-10-CM | POA: Diagnosis not present

## 2021-07-01 DIAGNOSIS — Z7722 Contact with and (suspected) exposure to environmental tobacco smoke (acute) (chronic): Secondary | ICD-10-CM | POA: Diagnosis not present

## 2021-07-01 DIAGNOSIS — Z8249 Family history of ischemic heart disease and other diseases of the circulatory system: Secondary | ICD-10-CM | POA: Diagnosis not present

## 2021-07-01 DIAGNOSIS — K219 Gastro-esophageal reflux disease without esophagitis: Secondary | ICD-10-CM | POA: Diagnosis not present

## 2021-07-01 DIAGNOSIS — E785 Hyperlipidemia, unspecified: Secondary | ICD-10-CM | POA: Diagnosis not present

## 2021-07-01 DIAGNOSIS — R69 Illness, unspecified: Secondary | ICD-10-CM | POA: Diagnosis not present

## 2021-07-01 DIAGNOSIS — B181 Chronic viral hepatitis B without delta-agent: Secondary | ICD-10-CM | POA: Diagnosis not present

## 2021-07-19 ENCOUNTER — Other Ambulatory Visit: Payer: Self-pay | Admitting: Internal Medicine

## 2021-07-19 DIAGNOSIS — Z1231 Encounter for screening mammogram for malignant neoplasm of breast: Secondary | ICD-10-CM

## 2021-08-14 ENCOUNTER — Other Ambulatory Visit: Payer: Self-pay | Admitting: Internal Medicine

## 2021-08-14 DIAGNOSIS — K589 Irritable bowel syndrome without diarrhea: Secondary | ICD-10-CM

## 2021-08-14 NOTE — Telephone Encounter (Signed)
Requested Prescriptions  ?Pending Prescriptions Disp Refills  ?? dicyclomine (BENTYL) 20 MG tablet [Pharmacy Med Name: DICYCLOMINE 20 MG TABLET] 270 tablet 0  ?  Sig: TAKE 1 TABLET (20 MG TOTAL) BY MOUTH 3 (THREE) TIMES DAILY BEFORE MEALS.  ?  ? Gastroenterology:  Antispasmodic Agents Passed - 08/14/2021  1:37 AM  ?  ?  Passed - Valid encounter within last 12 months  ?  Recent Outpatient Visits   ?      ? 2 months ago Upper respiratory tract infection, unspecified type  ? [facility] [place], [place], [place]  ? 2 months ago Encounter for [employer] annual wellness exam  ? [place], RPH-CPP  ? 4 months ago Need for influenza vaccination  ? [place] Ladell Pier, MD  ? 7 months ago No-show for appointment  ? [place] Ladell Pier, MD  ? 1 year ago Essential hypertension  ? [place] Ladell Pier, MD  ?  ?  ? ?  ?  ?  ? ?

## 2021-08-23 ENCOUNTER — Ambulatory Visit: Payer: Medicare Other

## 2021-09-04 ENCOUNTER — Other Ambulatory Visit: Payer: Self-pay | Admitting: Internal Medicine

## 2021-09-04 DIAGNOSIS — I10 Essential (primary) hypertension: Secondary | ICD-10-CM

## 2021-09-06 ENCOUNTER — Other Ambulatory Visit (HOSPITAL_COMMUNITY): Payer: Self-pay | Admitting: Orthopedic Surgery

## 2021-09-06 DIAGNOSIS — M25561 Pain in right knee: Secondary | ICD-10-CM | POA: Diagnosis not present

## 2021-09-06 DIAGNOSIS — M25562 Pain in left knee: Secondary | ICD-10-CM

## 2021-09-24 ENCOUNTER — Ambulatory Visit
Admission: RE | Admit: 2021-09-24 | Discharge: 2021-09-24 | Disposition: A | Discharge status: Home / Self Care | Payer: Medicare HMO | Source: Ambulatory Visit | Attending: Orthopedic Surgery | Admitting: Orthopedic Surgery

## 2021-09-24 DIAGNOSIS — M25562 Pain in left knee: Secondary | ICD-10-CM

## 2021-09-24 DIAGNOSIS — S83242A Other tear of medial meniscus, current injury, left knee, initial encounter: Secondary | ICD-10-CM | POA: Diagnosis not present

## 2021-09-27 DIAGNOSIS — H52223 Regular astigmatism, bilateral: Secondary | ICD-10-CM | POA: Diagnosis not present

## 2021-11-01 DIAGNOSIS — H401113 Primary open-angle glaucoma, right eye, severe stage: Secondary | ICD-10-CM | POA: Diagnosis not present

## 2021-11-01 DIAGNOSIS — H401122 Primary open-angle glaucoma, left eye, moderate stage: Secondary | ICD-10-CM | POA: Diagnosis not present

## 2021-11-15 ENCOUNTER — Ambulatory Visit
Admission: RE | Admit: 2021-11-15 | Discharge: 2021-11-15 | Disposition: A | Discharge status: Home / Self Care | Payer: Medicare HMO | Source: Ambulatory Visit | Attending: Internal Medicine | Admitting: Internal Medicine

## 2021-11-15 DIAGNOSIS — Z1231 Encounter for screening mammogram for malignant neoplasm of breast: Secondary | ICD-10-CM

## 2021-11-29 DIAGNOSIS — H401122 Primary open-angle glaucoma, left eye, moderate stage: Secondary | ICD-10-CM | POA: Diagnosis not present

## 2021-11-29 DIAGNOSIS — H401113 Primary open-angle glaucoma, right eye, severe stage: Secondary | ICD-10-CM | POA: Diagnosis not present

## 2021-12-06 ENCOUNTER — Other Ambulatory Visit: Payer: Self-pay | Admitting: Internal Medicine

## 2021-12-06 DIAGNOSIS — I10 Essential (primary) hypertension: Secondary | ICD-10-CM

## 2021-12-06 NOTE — Telephone Encounter (Signed)
Requested medication (s) are due for refill today:   Yes  Requested medication (s) are on the active medication list:   Yes  Future visit scheduled:   Yes in 2 wks   Last ordered: 04/05/2021 #135, 2 refills  Returned because lab work due   Requested Prescriptions  Pending Prescriptions Disp Refills   losartan (COZAAR) 50 MG tablet [Pharmacy Med Name: LOSARTAN POTASSIUM 50 MG TAB] 135 tablet 2    Sig: TAKE 1 AND 1/2 TABLETS BY MOUTH DAILY     Cardiovascular:  Angiotensin Receptor Blockers Failed - 12/06/2021 11:07 AM      Failed - Cr in normal range and within 180 days    Creat  Date Value Ref Range Status  07/02/2016 0.79 0.50 - 0.99 mg/dL Final    Comment:      For patients > or = 69 years of age: The upper reference limit for Creatinine is approximately 13% higher for people identified as African-American.      Creatinine, Ser  Date Value Ref Range Status  04/05/2021 0.78 0.57 - 1.00 mg/dL Final         Failed - K in normal range and within 180 days    Potassium  Date Value Ref Range Status  04/05/2021 3.8 3.5 - 5.2 mmol/L Final         Failed - Last BP in normal range    BP Readings from Last 1 Encounters:  05/17/21 (!) 148/80         Failed - Valid encounter within last 6 months    Recent Outpatient Visits           6 months ago Upper respiratory tract infection, unspecified type   [place], [place]   6 months ago Encounter for [employer] annual wellness exam   [place], Jarome [place], RPH-CPP   8 months ago Need for influenza vaccination   [place], [place]   11 months ago No-show for appointment   [place], Deborah B, MD   1 year ago Essential hypertension   [place], [place]       Future Appointments             In 2 weeks  Thereasa Solo, [place] - Patient is not pregnant

## 2021-12-20 ENCOUNTER — Ambulatory Visit: Payer: Medicare HMO | Attending: Physician Assistant | Admitting: Physician Assistant

## 2021-12-20 ENCOUNTER — Encounter: Payer: Self-pay | Admitting: Physician Assistant

## 2021-12-20 ENCOUNTER — Ambulatory Visit
Admission: RE | Admit: 2021-12-20 | Discharge: 2021-12-20 | Disposition: A | Discharge status: Home / Self Care | Payer: Medicare HMO | Source: Ambulatory Visit | Attending: Physician Assistant | Admitting: Physician Assistant

## 2021-12-20 VITALS — BP 150/93 | HR 54 | Wt 156.0 lb

## 2021-12-20 DIAGNOSIS — I10 Essential (primary) hypertension: Secondary | ICD-10-CM

## 2021-12-20 DIAGNOSIS — K219 Gastro-esophageal reflux disease without esophagitis: Secondary | ICD-10-CM | POA: Diagnosis not present

## 2021-12-20 DIAGNOSIS — R109 Unspecified abdominal pain: Secondary | ICD-10-CM

## 2021-12-20 DIAGNOSIS — K589 Irritable bowel syndrome without diarrhea: Secondary | ICD-10-CM

## 2021-12-20 DIAGNOSIS — E782 Mixed hyperlipidemia: Secondary | ICD-10-CM

## 2021-12-20 DIAGNOSIS — J301 Allergic rhinitis due to pollen: Secondary | ICD-10-CM

## 2021-12-20 LAB — POCT URINALYSIS DIP (CLINITEK)
Bilirubin, UA: NEGATIVE
Blood, UA: NEGATIVE
Glucose, UA: NEGATIVE mg/dL
Ketones, POC UA: NEGATIVE mg/dL
Leukocytes, UA: NEGATIVE
Nitrite, UA: NEGATIVE
POC PROTEIN,UA: NEGATIVE
Spec Grav, UA: 1.015 (ref 1.010–1.025)
Urobilinogen, UA: 0.2 E.U./dL
pH, UA: 7 (ref 5.0–8.0)

## 2021-12-20 MED ORDER — CETIRIZINE HCL 10 MG PO TABS: 10.0000 mg | ORAL_TABLET | Freq: Every day | ORAL | 2 refills | Status: DC

## 2021-12-20 MED ORDER — METHOCARBAMOL 500 MG PO TABS
500.0000 mg | ORAL_TABLET | Freq: Three times a day (TID) | ORAL | 0 refills | Status: DC | PRN
Start: 2021-12-20 — End: 2022-02-06

## 2021-12-20 MED ORDER — DICYCLOMINE HCL 20 MG PO TABS: 20.0000 mg | ORAL_TABLET | Freq: Three times a day (TID) | ORAL | 1 refills | Status: AC

## 2021-12-20 MED ORDER — FLUTICASONE PROPIONATE 50 MCG/ACT NA SUSP: 2.0000 | Freq: Every day | NASAL | 6 refills | Status: DC

## 2021-12-20 MED ORDER — ATORVASTATIN CALCIUM 40 MG PO TABS: 40.0000 mg | ORAL_TABLET | Freq: Every day | ORAL | 1 refills | Status: DC

## 2021-12-20 MED ORDER — LOSARTAN POTASSIUM 50 MG PO TABS: 75.0000 mg | ORAL_TABLET | Freq: Every day | ORAL | 4 refills | Status: DC

## 2021-12-20 MED ORDER — OMEPRAZOLE 40 MG PO CPDR: DELAYED_RELEASE_CAPSULE | ORAL | 1 refills | Status: DC

## 2021-12-20 MED ORDER — AMLODIPINE BESYLATE 10 MG PO TABS: 10.0000 mg | ORAL_TABLET | Freq: Every day | ORAL | 1 refills | Status: DC

## 2021-12-20 MED ORDER — DICLOFENAC SODIUM 1 % EX GEL: 2.0000 g | Freq: Four times a day (QID) | CUTANEOUS | 3 refills | Status: AC

## 2021-12-20 NOTE — Progress Notes (Signed)
Patient ID: Jacqueline Orozco, female   DOB: [birth year], 71 y.o.   MRN: [id number]     Jacqueline Orozco, is a 71 y.o. female  KDX:[id number]  [id number]  DOB - [birth year]  Chief Complaint  Patient presents with   Flank Pain       Subjective:   Jacqueline Orozco is a 71 y.o. female here today for a follow up visit and RF in addition to L flank pain, occasional nausea, No vomiting.  She the pain is constant.  She feels she may be a little constipated.  No blood in urine.  No urinary symptoms.  No BRBPR and no melena.  Describes the pain as a dull ache with twinges of more intensity.  She drinks 3 cups water daily. Energy levels are good.  She speaks fairly good english.     No problems updated.  ALLERGIES: Allergies  Allergen Reactions   Aspirin Other (See Comments)    stomach pain, stomach bleeding   Penicillins Nausea And Vomiting and Other (See Comments)    Dizzy Has patient had a PCN reaction causing immediate rash, facial/tongue/throat swelling, SOB or lightheadedness with hypotension: No Has patient had a PCN reaction causing severe rash involving mucus membranes or skin necrosis: No Has patient had a PCN reaction that required hospitalization; No Has patient had a PCN reaction occurring within the last 10 years: No If all of the above answers are "NO", then may proceed with Cephalosporin use.    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 Authorizing Provider  acetaminophen (TYLENOL 8 HOUR) 650 MG CR tablet Take 1 tablet (650 mg total) by mouth every 8 (eight) hours as needed for pain. 07/28/15  Yes Funches, Josalyn, MD  diclofenac Sodium (VOLTAREN) 1 % GEL Apply 2 g topically 4 (four) times daily. 12/20/21  Yes Ewald Beg M, PA-C  fluticasone (FLONASE) 50 MCG/ACT nasal spray Place 2 sprays into both nostrils daily. 12/20/21  Yes Tiffancy Moger, Dionne Bucy, PA-C  methocarbamol (ROBAXIN) 500 MG tablet Take 1 tablet (500 mg total) by mouth every 8 (eight) hours as needed for muscle spasms. 12/20/21  Yes Freeman Caldron M, PA-C  polyethylene glycol powder (GLYCOLAX/MIRALAX) powder Take 17 grams PO PRN 04/21/18  Yes Ladell Pier, MD  promethazine-dextromethorphan (PROMETHAZINE-DM) 6.25-15 MG/5ML syrup Take 5 mLs by mouth 4 (four) times daily as needed for cough. 06/06/21  Yes Freeman Caldron M, PA-C  amLODipine (NORVASC) 10 MG tablet Take 1 tablet (10 mg total) by mouth daily. 12/20/21   Argentina Donovan, PA-C  atorvastatin (LIPITOR) 40 MG tablet Take 1 tablet (40 mg total) by mouth daily. 12/20/21   Argentina Donovan, PA-C  cetirizine (ZYRTEC) 10 MG tablet Take 1 tablet (10 mg total) by  mouth daily. 12/20/21   Argentina Donovan, PA-C  dicyclomine (BENTYL) 20 MG tablet Take 1 tablet (20 mg total) by mouth 3 (three) times daily before meals. prn 12/20/21   Argentina Donovan, PA-C  losartan (COZAAR) 50 MG tablet Take 1.5 tablets (75 mg total) by mouth daily. 12/20/21   Argentina Donovan, PA-C  omeprazole (PRILOSEC) 40 MG capsule TAKE 1 CAPSULE BY MOUTH EVERY DAY 12/20/21   Malyk Girouard, Dionne Bucy, PA-C    ROS: Neg HEENT Neg resp Neg cardiac Neg GI Neg GU Neg MS Neg psych Neg neuro  Objective:   Vitals:   12/20/21 0923  BP: (!) 150/93  Pulse: (!) 54  SpO2: 97%  Weight: 156 lb (70.8 kg)   Exam General appearance : Awake, alert, not  in any distress. Speech Clear. Not toxic looking HEENT: Atraumatic and Normocephalic Neck: Supple, no JVD. No cervical lymphadenopathy.  Chest: Good air entry bilaterally, CTAB.  No rales/rhonchi/wheezing CVS: S1 S2 regular, no murmurs.  Abdomen: Bowel sounds present, Non tender and not distended with no gaurding, rigidity or rebound. No CVA TTP Extremities: B/L Lower Ext shows no edema, both legs are warm to touch Neurology: Awake alert, and oriented X 3, CN II-XII intact, Non focal Skin: No Rash  Data Review Lab Results  Component Value Date   HGBA1C 5.80 07/28/2015    Assessment & Plan   1. Flank pain No red flags - POCT URINALYSIS DIP (CLINITEK) - CBC with Differential/Platelet - DG Abd 1 View; Future - diclofenac Sodium (VOLTAREN) 1 % GEL; Apply 2 g topically 4 (four) times daily.  Dispense: 100 g; Refill: 3 - methocarbamol (ROBAXIN) 500 MG tablet; Take 1 tablet (500 mg total) by mouth every 8 (eight) hours as needed for muscle spasms.  Dispense: 60 tablet; Refill: 0  2. Mixed hyperlipidemia - Comprehensive metabolic panel - Lipid panel - atorvastatin (LIPITOR) 40 MG tablet; Take 1 tablet (40 mg total) by mouth daily.  Dispense: 90 tablet; Refill: 1  3. Seasonal allergic rhinitis due to pollen - cetirizine (ZYRTEC) 10 MG tablet; Take 1 tablet (10 mg total) by mouth daily.  Dispense: 30 tablet; Refill: 2 - fluticasone (FLONASE) 50 MCG/ACT nasal spray; Place 2 sprays into both nostrils daily.  Dispense: 16 g; Refill: 6  4. Essential hypertension Controlled POO per patient - Comprehensive metabolic panel - CBC with Differential/Platelet - losartan (COZAAR) 50 MG tablet; Take 1.5 tablets (75 mg total) by mouth daily.  Dispense: 45 tablet; Refill: 4 - amLODipine (NORVASC) 10 MG tablet; Take 1 tablet (10 mg total) by mouth daily.  Dispense: 90 tablet; Refill: 1  5. IBS (irritable bowel syndrome) - DG Abd 1 View; Future - dicyclomine (BENTYL) 20 MG tablet; Take 1 tablet (20  mg total) by mouth 3 (three) times daily before meals. prn  Dispense: 270 tablet; Refill: 1  6. Gastroesophageal reflux disease without esophagitis - omeprazole (PRILOSEC) 40 MG capsule; TAKE 1 CAPSULE BY MOUTH EVERY DAY  Dispense: 90 capsule; Refill: 1    Return in about 3 months (around 03/22/2022) for PCP for chronic conditions.  The patient was given clear instructions to go to ER or return to medical center if symptoms don't improve, worsen or new problems develop. The patient verbalized understanding. The patient was told to call to get lab results if they haven't heard anything in the next week.      Freeman Caldron, PA-C [facility] and [facility], [place]   12/20/2021, 9:43  AM  

## 2021-12-20 NOTE — Patient Instructions (Signed)
Drink 80-100 ounces water daily 

## 2021-12-21 LAB — COMPREHENSIVE METABOLIC PANEL
ALT: 21 IU/L (ref 0–32)
AST: 23 IU/L (ref 0–40)
Albumin/Globulin Ratio: 1.4 (ref 1.2–2.2)
Albumin: 4.7 g/dL (ref 3.9–4.9)
Alkaline Phosphatase: 98 IU/L (ref 44–121)
BUN/Creatinine Ratio: 10 — ABNORMAL LOW (ref 12–28)
BUN: 8 mg/dL (ref 8–27)
Bilirubin Total: 0.4 mg/dL (ref 0.0–1.2)
CO2: 26 mmol/L (ref 20–29)
Calcium: 9.9 mg/dL (ref 8.7–10.3)
Chloride: 102 mmol/L (ref 96–106)
Creatinine, Ser: 0.79 mg/dL (ref 0.57–1.00)
Globulin, Total: 3.4 g/dL (ref 1.5–4.5)
Glucose: 89 mg/dL (ref 70–99)
Potassium: 4.1 mmol/L (ref 3.5–5.2)
Sodium: 141 mmol/L (ref 134–144)
Total Protein: 8.1 g/dL (ref 6.0–8.5)
eGFR: 81 mL/min/{1.73_m2} (ref >59)

## 2021-12-21 LAB — CBC WITH DIFFERENTIAL/PLATELET
Basophils Absolute: 0 10*3/uL (ref 0.0–0.2)
Basos: 1 %
EOS (ABSOLUTE): 0.2 10*3/uL (ref 0.0–0.4)
Eos: 4 %
Hematocrit: 41.5 % (ref 34.0–46.6)
Hemoglobin: 13.2 g/dL (ref 11.1–15.9)
Immature Grans (Abs): 0 10*3/uL (ref 0.0–0.1)
Immature Granulocytes: 0 %
Lymphocytes Absolute: 1.9 10*3/uL (ref 0.7–3.1)
Lymphs: 44 %
MCH: 26.6 pg (ref 26.6–33.0)
MCHC: 31.8 g/dL (ref 31.5–35.7)
MCV: 84 fL (ref 79–97)
Monocytes Absolute: 0.3 10*3/uL (ref 0.1–0.9)
Monocytes: 7 %
Neutrophils Absolute: 2 10*3/uL (ref 1.4–7.0)
Neutrophils: 44 %
Platelets: 199 10*3/uL (ref 150–450)
RBC: 4.97 x10E6/uL (ref 3.77–5.28)
RDW: 14 % (ref 11.7–15.4)
WBC: 4.4 10*3/uL (ref 3.4–10.8)

## 2021-12-21 LAB — LIPID PANEL
Chol/HDL Ratio: 2.8 ratio (ref 0.0–4.4)
Cholesterol, Total: 146 mg/dL (ref 100–199)
HDL: 53 mg/dL (ref >39)
LDL Chol Calc (NIH): 70 mg/dL (ref 0–99)
Triglycerides: 129 mg/dL (ref 0–149)
VLDL Cholesterol Cal: 23 mg/dL (ref 5–40)

## 2022-02-06 ENCOUNTER — Other Ambulatory Visit: Payer: Self-pay | Admitting: Physician Assistant

## 2022-02-06 DIAGNOSIS — J301 Allergic rhinitis due to pollen: Secondary | ICD-10-CM

## 2022-02-06 DIAGNOSIS — I10 Essential (primary) hypertension: Secondary | ICD-10-CM

## 2022-02-06 DIAGNOSIS — R109 Unspecified abdominal pain: Secondary | ICD-10-CM

## 2022-02-06 NOTE — Telephone Encounter (Signed)
Requested medication (s) are due for refill today: Yes  Requested medication (s) are on the active medication list: Yes  Last refill:  12/20/21  Future visit scheduled: No  Notes to clinic:  See request.    Requested Prescriptions  Pending Prescriptions Disp Refills   methocarbamol (ROBAXIN) 500 MG tablet [Pharmacy Med Name: METHOCARBAMOL 500 MG TABLET] 60 tablet 0    Sig: TAKE 1 TABLET BY MOUTH EVERY 8 HOURS AS NEEDED FOR MUSCLE SPASMS.     Not Delegated - Analgesics:  Muscle Relaxants Failed - 02/06/2022  9:05 AM      Failed - This refill cannot be delegated      Passed - Valid encounter within last 6 months    Recent Outpatient Visits           1 month ago Flank pain   [place], [place], [place]   8 months ago Upper respiratory tract infection, unspecified type   [place], [place], [place]   8 months ago Encounter for [employer] annual wellness exam   [place], RPH-CPP   10 months ago Need for influenza vaccination   [place] Ladell Pier, MD   1 year ago No-show for appointment   [place] Ladell Pier, MD              Signed Prescriptions Disp Refills   cetirizine (ZYRTEC) 10 MG tablet 90 tablet 0    Sig: TAKE 1 TABLET BY MOUTH EVERY DAY     Ear, Nose, and Throat:  Antihistamines 2 Passed - 02/06/2022  9:05 AM      Passed - Cr in normal range and within 360 days    Creat  Date Value Ref Range Status  07/02/2016 0.79 0.50 - 0.99 mg/dL Final    Comment:      For patients > or = 69 years of age: The upper reference limit for Creatinine is approximately 13% higher for people identified as African-American.      Creatinine, Ser  Date Value Ref Range Status  12/20/2021 0.79 0.57 - 1.00 mg/dL Final         Passed - Valid encounter within last 12 months     Recent Outpatient Visits           1 month ago Flank pain   [place], [place], [place]   8 months ago Upper respiratory tract infection, unspecified type   [place], [place], [place]   8 months ago Encounter for [employer] annual wellness exam   [place], RPH-CPP   10 months ago Need for influenza vaccination   [place] Ladell Pier, MD   1 year ago No-show for appointment   [place] Ladell Pier, MD               losartan (COZAAR) 50 MG tablet 135 tablet 0    Sig: TAKE 1 AND 1/2 TABLETS BY MOUTH DAILY     Cardiovascular:  Angiotensin Receptor Blockers Failed - 02/06/2022  9:05 AM      Failed - Last BP in normal range    BP Readings from Last 1 Encounters:  12/20/21 (!) 150/93  Passed - Cr in normal range and within 180 days    Creat  Date Value Ref Range Status  07/02/2016 0.79 0.50 - 0.99 mg/dL Final    Comment:      For patients > or = 69 years of age: The upper reference limit for Creatinine is approximately 13% higher for people identified as African-American.      Creatinine, Ser  Date Value Ref Range Status  12/20/2021 0.79 0.57 - 1.00 mg/dL Final         Passed - K in normal range and within 180 days    Potassium  Date Value Ref Range Status  12/20/2021 4.1 3.5 - 5.2 mmol/L Final         Passed - Patient is not pregnant      Passed - Valid encounter within last 6 months    Recent Outpatient Visits           1 month ago Flank pain   [place], [place], [place]   8 months ago Upper respiratory tract infection, unspecified type   [place], [place], [place]   8 months ago Encounter for [employer] annual wellness exam   [place], RPH-CPP   10 months ago Need for influenza vaccination   [place] Ladell Pier, MD   1 year ago No-show for appointment   [facility] Ladell Pier, MD

## 2022-04-22 ENCOUNTER — Ambulatory Visit: Payer: Self-pay

## 2022-04-22 NOTE — Telephone Encounter (Signed)
  Chief Complaint: left side of abdomen pain Symptoms: severe pain, thinks may have sttol "stuck", bloating, res- back and up to chest- denies SOB, radiating pain Frequency: last week Pertinent Negatives: Patient denies vomiting, fever, diarrhea, urination pain Disposition: '[]'$ ED /'[]'$ Urgent Care (no appt availability in office) / '[]'$ Appointment(In office/virtual)/ '[]'$  [place] [facility]/ '[]'$ Home Care/ '[]'$ Refused Recommended Disposition /'[]'$ [place] [facility]/ '[]'$  Follow-up with PCP Additional Notes: To ED Reason for Disposition . [1] SEVERE pain AND [2] age > 60 years  Answer Assessment - Initial Assessment Questions 1. LOCATION: "Where does it hurt?"      Left side "congestion" 2. RADIATION: "Does the pain shoot anywhere else?" (e.g., chest, back)     Yes- whole body  3. ONSET: "When did the pain begin?" (e.g., minutes, hours or days ago)      Last week (Thursday) 4. SUDDEN: "Gradual or sudden onset?"     Asked not answered  5. PATTERN "Does the pain come and go, or is it constant?"    - If it comes and goes: "How long does it last?" "Do you have pain now?"     (Note: Comes and goes means the pain is intermittent. It goes away completely between bouts.)    - If constant: "Is it getting better, staying the same, or getting worse?"      (Note: Constant means the pain never goes away completely; most serious pain is constant and gets worse.)      constant 6. SEVERITY: "How bad is the pain?"  (e.g., Scale 1-10; mild, moderate, or severe)    - MILD (1-3): Doesn't interfere with normal activities, abdomen soft and not tender to touch.     - MODERATE (4-7): Interferes with normal activities or awakens from sleep, abdomen tender to touch.     - SEVERE (8-10): Excruciating pain, doubled over, unable to do any normal activities.       severe 7. RECURRENT SYMPTOM: "Have you ever had this type of stomach pain before?" If Yes, ask: "When was the last time?" and "What happened that time?"       no 8. CAUSE: "What do you think is causing the stomach pain?"     Though needed to go to bathroom  9. RELIEVING/AGGRAVATING FACTORS: "What makes it better or worse?" (e.g., antacids, bending or twisting motion, bowel movement)     Feels bowel stuck  10. OTHER SYMPTOMS: "Do you have any other symptoms?" (e.g., back pain, diarrhea, fever, urination pain, vomiting)       Bloating,  11. PREGNANCY: "Is there any chance you are pregnant?" "When was your last menstrual period?"       N/a  Protocols used: Abdominal Pain - [facility]

## 2022-04-23 ENCOUNTER — Telehealth: Payer: Self-pay | Admitting: Internal Medicine

## 2022-04-23 ENCOUNTER — Ambulatory Visit: Payer: Self-pay | Admitting: Internal Medicine

## 2022-04-23 NOTE — Telephone Encounter (Signed)
Pt returning call

## 2022-04-23 NOTE — Telephone Encounter (Signed)
Left message on voicemail to return call.

## 2022-04-23 NOTE — Telephone Encounter (Signed)
Patient returned call back abut symptoms she was having and notes found about UC. Please call back

## 2022-04-23 NOTE — Telephone Encounter (Signed)
Opened in error; Disregard.

## 2022-04-24 NOTE — Telephone Encounter (Signed)
Left message on voicemail to return call.     Ladell Pier, MD  You; [name], CMA2 days ago    Give UC appt with any available provider.  In the main time she can try Miralax OCT for constipation.

## 2022-06-14 ENCOUNTER — Ambulatory Visit: Payer: Self-pay

## 2022-06-14 NOTE — Telephone Encounter (Signed)
  Patient called in says has cough, didn't wan an appt. She is asking for meds           Left message with daughter to call back. Having difficulty with appropriate language interpreter.

## 2022-06-14 NOTE — Telephone Encounter (Signed)
Called [employer] and tried accessing interpreter for [place] language but language not availabe. Pt called, LVMTCB to discuss sx.

## 2022-06-14 NOTE — Telephone Encounter (Signed)
  Chief Complaint: URI Symptoms: cough, congestion, chills, hoarseness, chest discomfort Frequency: 1 week  Pertinent Negatives: Patient denies SOB or fever Disposition: '[]'$ ED /'[x]'$ Urgent Care (no appt availability in office) / '[]'$ Appointment(In office/virtual)/ '[]'$  [place] [facility]/ '[]'$ Home Care/ '[]'$ Refused Recommended Disposition /'[]'$ [place] [facility]/ '[]'$  Follow-up with PCP Additional Notes: pt requesting rx for sx. Advised pt having to do virtual or office visit. Scheduled UC tomorrow at 1230 d/t no office apts available.   Reason for Disposition  [1] Sinus congestion (pressure, fullness) AND [2] present > 10 days  Answer Assessment - Initial Assessment Questions 1. ONSET: "When did the nasal discharge start?"      1 week  3. COUGH: "Do you have a cough?" If Yes, ask: "Describe the color of your sputum" (clear, white, yellow, green)     yes 4. RESPIRATORY DISTRESS: "Describe your breathing."      no 5. FEVER: "Do you have a fever?" If Yes, ask: "What is your temperature, how was it measured, and when did it start?"     Unsure  6. SEVERITY: "Overall, how bad are you feeling right now?" (e.g., doesn't interfere with normal activities, staying home from school/work, staying in bed)      Staying in bed  7. OTHER SYMPTOMS: "Do you have any other symptoms?" (e.g., sore throat, earache, wheezing, vomiting)     Chills congestion hoarseness, chest discomfort  Protocols used: Common Cold-A-AH

## 2022-06-15 ENCOUNTER — Ambulatory Visit (HOSPITAL_COMMUNITY)
Admission: RE | Admit: 2022-06-15 | Discharge: 2022-06-15 | Disposition: A | Discharge status: Home / Self Care | Payer: Medicare HMO | Source: Ambulatory Visit | Attending: Emergency Medicine | Admitting: Emergency Medicine

## 2022-06-15 ENCOUNTER — Encounter (HOSPITAL_COMMUNITY): Payer: Self-pay

## 2022-06-15 VITALS — BP 121/79 | HR 70 | Temp 98.8°F | Resp 17

## 2022-06-15 DIAGNOSIS — J069 Acute upper respiratory infection, unspecified: Secondary | ICD-10-CM

## 2022-06-15 MED ORDER — PREDNISONE 20 MG PO TABS: 40.0000 mg | ORAL_TABLET | Freq: Every day | ORAL | 0 refills | Status: DC

## 2022-06-15 MED ORDER — BENZONATATE 100 MG PO CAPS: 100.0000 mg | ORAL_CAPSULE | Freq: Three times a day (TID) | ORAL | 0 refills | Status: DC

## 2022-06-15 MED ORDER — PROMETHAZINE-DM 6.25-15 MG/5ML PO SYRP: 5.0000 mL | ORAL_SOLUTION | Freq: Four times a day (QID) | ORAL | 0 refills | Status: DC | PRN

## 2022-06-15 NOTE — Discharge Instructions (Addendum)
Your symptoms today are most likely being caused by a virus and should steadily improve in time it can take up to 7 to 10 days before you truly start to see a turnaround however things will get better  Begin prednisone every morning with food for 5 days to help calm airway  You may use tessalon pill every 8 hours to calm coughing  You may use cough syrup every 6 hours as needed, be mindful this may make you feel drowsy     You can take Tylenol and/or Ibuprofen as needed for fever reduction and pain relief.   For cough: honey 1/2 to 1 teaspoon (you can dilute the honey in water or another fluid).  You can also use guaifenesin and dextromethorphan for cough. You can use a humidifier for chest congestion and cough.  If you don't have a humidifier, you can sit in the bathroom with the hot shower running.      For sore throat: try warm salt water gargles, cepacol lozenges, throat spray, warm tea or water with lemon/honey, popsicles or ice, or OTC cold relief medicine for throat discomfort.   For congestion: take a daily anti-histamine like Zyrtec, Claritin, and a oral decongestant, such as pseudoephedrine.  You can also use Flonase 1-2 sprays in each nostril daily.   It is important to stay hydrated: drink plenty of fluids (water, gatorade/powerade/pedialyte, juices, or teas) to keep your throat moisturized and help further relieve irritation/discomfort.

## 2022-06-15 NOTE — ED Triage Notes (Signed)
Since 1/1 having dry cough, runny nose and chills. Reports OTC cough medications and drops aren't helping. Reports coughing so much urinating on self. Having back and shoulder pains as well.

## 2022-06-15 NOTE — ED Provider Notes (Signed)
[place]    CSN: [id number] Arrival date & time: 06/15/22  1157      History   Chief Complaint Chief Complaint  Patient presents with   Cough    Entered by patient   Chills    HPI Jacqueline Orozco is a 72 y.o. female.   Patient presents for evaluation of nasal congestion, rhinorrhea, sore throat, nonproductive cough, fever and chills beginning 6 days ago.  Fever and chills have resolved.  Cough is persistent and harsh and coughing fits causing incontinence.  Associated bilateral ear fullness but denies pain, drainage or pruritus.  Decreased appetite but forcing herself to drink food and liquids.  Has attempted use of TheraFlu day and night, Mucinex and T[place]in DM which have been ineffective.  Denies shortness of breath or wheezing.  No pertinent medical history.  In person formal interpreter used.     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   Blood transfusion without reported diagnosis    Cold sore    Fatty liver    Gastric AVM    GERD (gastroesophageal reflux disease)    HEPATITIS B, CHRONIC 03/28/2010   HYPERLIPIDEMIA 03/28/2010   HYPERTENSION 03/28/2010   IBS (irritable bowel syndrome)    Internal hemorrhoids    Lumbar disc disease 09/29/2013   PONV (postoperative nausea and vomiting)    headache also    Patient Active Problem List   Diagnosis Date Noted   Primary osteoarthritis of both knees 05/28/2019   Motion sickness 08/26/2017   Meniere disease, left 07/25/2017   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   Anterolisthesis 12/24/2016   Allergic contact dermatitis due to adhesives 07/02/2016   Hydronephrosis with ureteropelvic junction (UPJ) obstruction 04/15/2016   Osteoarthritis of right wrist 07/28/2015   Gastric and duodenal angiodysplasia    Seasonal allergies 09/15/2014   IBS (irritable bowel syndrome) 06/20/2014   Lumbar disc disease 09/29/2013   Gastric AVM 12/29/2012   Hx of  adenomatous colonic polyps 10/16/2012   Plantar fasciitis, right 05/19/2012   Cervical radiculitis 02/12/2012   Vertigo 05/22/2011   HEPATITIS B, CHRONIC 03/28/2010   HLD (hyperlipidemia) 03/28/2010   Essential hypertension 03/28/2010    Past Surgical History:  Procedure Laterality Date   COLONOSCOPY WITH PROPOFOL N/A 01/03/2015   Procedure: COLONOSCOPY WITH PROPOFOL;  Surgeon: Jerene Bears, MD;  Location: [facility];  Service: Gastroenterology;  Laterality: N/A;   CYSTOSCOPY W/ URETERAL STENT PLACEMENT Left 06/17/2016   Procedure: CYSTOSCOPY WITH RETROGRADE PYELOGRAM/URETERAL STENT PLACEMENT;  Surgeon: Raynelle Bring, MD;  Location: [facility];  Service: Urology;  Laterality: Left;   ESOPHAGOGASTRODUODENOSCOPY (EGD) WITH PROPOFOL N/A 01/03/2015   Procedure: ESOPHAGOGASTRODUODENOSCOPY (EGD) WITH PROPOFOL;  Surgeon: Jerene Bears, MD;  Location: [facility];  Service: Gastroenterology;  Laterality: N/A;   ESOPHAGOGASTRODUODENOSCOPY ENDOSCOPY     several times   HOT HEMOSTASIS N/A 01/03/2015   Procedure: HOT HEMOSTASIS (ARGON PLASMA COAGULATION/BICAP);  Surgeon: Jerene Bears, MD;  Location: [facility];  Service: Gastroenterology;  Laterality: N/A;   NO PAST SURGERIES     ROBOT ASSISTED PYELOPLASTY Left 06/17/2016   Procedure: XI ROBOTIC ASSISTED PYELOPLASTY;  Surgeon: Raynelle Bring, MD;  Location: [facility];  Service: Urology;  Laterality: Left;    OB History   No obstetric history on file.      Home Medications    Prior to Admission medications   Medication Sig Start Date End Date  Taking? Authorizing Provider  acetaminophen (TYLENOL 8 HOUR) 650 MG CR tablet Take 1 tablet (650 mg total) by mouth every 8 (eight) hours as needed for pain. 07/28/15   Funches, Adriana Mccallum, MD  amLODipine (NORVASC) 10 MG tablet Take 1 tablet (10 mg total) by mouth daily. 12/20/21   Argentina Donovan, PA-C  atorvastatin (LIPITOR) 40 MG tablet Take 1 tablet (40 mg total) by mouth daily. 12/20/21   Argentina Donovan, PA-C   cetirizine (ZYRTEC) 10 MG tablet TAKE 1 TABLET BY MOUTH EVERY DAY 02/06/22   Ladell Pier, MD  diclofenac Sodium (VOLTAREN) 1 % GEL Apply 2 g topically 4 (four) times daily. 12/20/21   Argentina Donovan, PA-C  dicyclomine (BENTYL) 20 MG tablet Take 1 tablet (20 mg total) by mouth 3 (three) times daily before meals. prn 12/20/21   Argentina Donovan, PA-C  fluticasone (FLONASE) 50 MCG/ACT nasal spray Place 2 sprays into both nostrils daily. 12/20/21   Argentina Donovan, PA-C  losartan (COZAAR) 50 MG tablet TAKE 1 AND 1/2 TABLETS BY MOUTH DAILY 02/06/22   Ladell Pier, MD  methocarbamol (ROBAXIN) 500 MG tablet TAKE 1 TABLET BY MOUTH EVERY 8 HOURS AS NEEDED FOR MUSCLE SPASMS. 02/06/22   Ladell Pier, MD  omeprazole (PRILOSEC) 40 MG capsule TAKE 1 CAPSULE BY MOUTH EVERY DAY 12/20/21   Freeman Caldron M, PA-C  polyethylene glycol powder (GLYCOLAX/MIRALAX) powder Take 17 grams PO PRN 04/21/18   Ladell Pier, MD  promethazine-dextromethorphan (PROMETHAZINE-DM) 6.25-15 MG/5ML syrup Take 5 mLs by mouth 4 (four) times daily as needed for cough. 06/06/21   Argentina Donovan, PA-C    Family History Family History  Problem Relation Age of Onset   Hypertension Mother    Stomach cancer Father    Liver disease Maternal Uncle    Lung cancer Maternal Grandmother    Breast cancer Neg Hx     Social History Social History   Tobacco Use   Smoking status: Never   Smokeless tobacco: Never  Substance Use Topics   Alcohol use: Yes    Comment: occasional wine   Drug use: No     Allergies   Aspirin, Penicillins, Latex, Streptomycin, and Tramadol   Review of Systems Review of Systems  Constitutional:  Positive for chills and fever. Negative for activity change, appetite change, diaphoresis, fatigue and unexpected weight change.  HENT:  Positive for congestion, rhinorrhea and sore throat. Negative for dental problem, drooling, ear discharge, ear pain, facial swelling, hearing loss, mouth  sores, nosebleeds, postnasal drip, sinus pressure, sinus pain, sneezing, tinnitus, trouble swallowing and voice change.   Eyes: Negative.   Respiratory:  Positive for cough. Negative for apnea, choking, chest tightness, shortness of breath, wheezing and stridor.   Cardiovascular: Negative.   Gastrointestinal: Negative.   Skin: Negative.   Neurological: Negative.      Physical Exam Triage Vital Signs ED Triage Vitals  Enc Vitals Group     BP 06/15/22 1213 121/79     Pulse Rate 06/15/22 1213 70     Resp 06/15/22 1213 17     Temp 06/15/22 1213 98.8 F (37.1 C)     Temp Source 06/15/22 1213 Oral     SpO2 06/15/22 1213 96 %     Weight --      Height --      Head Circumference --      Peak Flow --      Pain Score 06/15/22 1212 5  Pain Loc --      Pain Edu? --      Excl. in [place]? --    No data found.  Updated Vital Signs BP 121/79 (BP Location: Left Arm)   Pulse 70   Temp 98.8 F (37.1 C) (Oral)   Resp 17   SpO2 96%   Visual Acuity Right Eye Distance:   Left Eye Distance:   Bilateral Distance:    Right Eye Near:   Left Eye Near:    Bilateral Near:     Physical Exam Constitutional:      Appearance: Normal appearance.  HENT:     Head: Normocephalic.     Right Ear: Tympanic membrane, ear canal and external ear normal.     Left Ear: Tympanic membrane, ear canal and external ear normal.     Nose: Congestion and rhinorrhea present.     Mouth/Throat:     Mouth: Mucous membranes are moist.     Pharynx: Posterior oropharyngeal erythema present.  Cardiovascular:     Rate and Rhythm: Normal rate and regular rhythm.     Pulses: Normal pulses.     Heart sounds: Normal heart sounds.  Pulmonary:     Effort: Pulmonary effort is normal.     Breath sounds: Normal breath sounds.  Skin:    General: Skin is warm and dry.  Neurological:     Mental Status: She is alert and oriented to person, place, and time. Mental status is at baseline.  Psychiatric:        Mood and  Affect: Mood normal.        Behavior: Behavior normal.      UC Treatments / Results  Labs (all labs ordered are listed, but only abnormal results are displayed) Labs Reviewed - No data to display  EKG   Radiology No results found.  Procedures Procedures (including critical care time)  Medications Ordered in UC Medications - No data to display  Initial Impression / Assessment and Plan / UC Course  I have reviewed the triage vital signs and the nursing notes.  Pertinent labs & imaging results that were available during my care of the patient were reviewed by me and considered in my medical decision making (see chart for details).  Viral URI with cough  Patient is in no signs of distress nor toxic appearing.  Vital signs are stable.  Low suspicion for pneumonia, pneumothorax or bronchitis and therefore will defer imaging.  Viral testing deferred due to timeline of illness.  Prescribed prednisone, Tessalon and Promethazine DM.  May use additional over-the-counter medications as needed for supportive care.  May follow-up with urgent care as needed if symptoms persist or worsen.   Final Clinical Impressions(s) / UC Diagnoses   Final diagnoses:  None   Discharge Instructions   None    ED Prescriptions   None    PDMP not reviewed this encounter.   Hans Eden, NP 06/15/22 1241

## 2022-06-22 ENCOUNTER — Other Ambulatory Visit: Payer: Self-pay | Admitting: Physician Assistant

## 2022-06-22 DIAGNOSIS — I10 Essential (primary) hypertension: Secondary | ICD-10-CM

## 2022-06-22 DIAGNOSIS — K219 Gastro-esophageal reflux disease without esophagitis: Secondary | ICD-10-CM

## 2022-06-23 ENCOUNTER — Other Ambulatory Visit: Payer: Self-pay | Admitting: Physician Assistant

## 2022-06-23 DIAGNOSIS — J301 Allergic rhinitis due to pollen: Secondary | ICD-10-CM

## 2022-06-24 DIAGNOSIS — H401113 Primary open-angle glaucoma, right eye, severe stage: Secondary | ICD-10-CM | POA: Diagnosis not present

## 2022-06-24 DIAGNOSIS — H401122 Primary open-angle glaucoma, left eye, moderate stage: Secondary | ICD-10-CM | POA: Diagnosis not present

## 2022-06-24 NOTE — Telephone Encounter (Signed)
Requested Prescriptions  Pending Prescriptions Disp Refills   amLODipine (NORVASC) 10 MG tablet [Pharmacy Med Name: AMLODIPINE BESYLATE 10 MG TAB] 90 tablet 0    Sig: TAKE 1 TABLET BY MOUTH EVERY DAY     Cardiovascular: Calcium Channel Blockers 2 Failed - 06/22/2022  8:24 AM      Failed - Valid encounter within last 6 months    Recent Outpatient Visits           6 months ago Flank pain   [facility], [place], [place]   1 year ago Upper respiratory tract infection, unspecified type   [place], [place], [place]   1 year ago Encounter for [employer] annual wellness exam   [place], RPH-CPP   1 year ago Need for influenza vaccination   [place], Deborah B, MD   1 year ago No-show for appointment   [place] Ladell Pier, MD              Passed - Last BP in normal range    BP Readings from Last 1 Encounters:  06/15/22 121/79         Passed - Last Heart Rate in normal range    Pulse Readings from Last 1 Encounters:  06/15/22 70          omeprazole (PRILOSEC) 40 MG capsule [Pharmacy Med Name: OMEPRAZOLE DR 40 MG CAPSULE] 90 capsule 0    Sig: TAKE [place] DAY     Gastroenterology: Proton Pump Inhibitors Passed - 06/22/2022  8:24 AM      Passed - Valid encounter within last 12 months    Recent Outpatient Visits           6 months ago Flank pain   [place], [place]   1 year ago Upper respiratory tract infection, unspecified type   [place], [place], [place]   1 year ago Encounter for [employer] annual wellness exam   [place], Jarome Matin, RPH-CPP   1 year ago Need for influenza vaccination   [place] Ladell Pier, MD   1 year ago No-show for appointment   [facility] Ladell Pier, MD

## 2022-06-28 ENCOUNTER — Telehealth: Payer: Self-pay | Admitting: Internal Medicine

## 2022-06-28 NOTE — Telephone Encounter (Signed)
Left message for patient to call back and schedule Medicare Annual Wellness Visit (AWV) either virtually or phone  . Left  my Herbie Drape number [phone]   Last AWV 05/17/21

## 2022-07-01 ENCOUNTER — Other Ambulatory Visit: Payer: Self-pay | Admitting: Internal Medicine

## 2022-07-01 DIAGNOSIS — I10 Essential (primary) hypertension: Secondary | ICD-10-CM

## 2022-07-02 NOTE — Telephone Encounter (Signed)
Courtesy refill. Patient will need an office visit for further refills. Requested Prescriptions  Pending Prescriptions Disp Refills   losartan (COZAAR) 50 MG tablet [Pharmacy Med Name: LOSARTAN POTASSIUM 50 MG TAB] 45 tablet 0    Sig: TAKE 1 AND 1/2 TABLETS DAILY BY MOUTH     Cardiovascular:  Angiotensin Receptor Blockers Failed - 07/01/2022  1:18 AM      Failed - Cr in normal range and within 180 days    Creat  Date Value Ref Range Status  07/02/2016 0.79 0.50 - 0.99 mg/dL Final    Comment:      For patients > or = 70 years of age: The upper reference limit for Creatinine is approximately 13% higher for people identified as African-American.      Creatinine, Ser  Date Value Ref Range Status  12/20/2021 0.79 0.57 - 1.00 mg/dL Final         Failed - K in normal range and within 180 days    Potassium  Date Value Ref Range Status  12/20/2021 4.1 3.5 - 5.2 mmol/L Final         Failed - Valid encounter within last 6 months    Recent Outpatient Visits           6 months ago Flank pain   [place], [place]   1 year ago Upper respiratory tract infection, unspecified type   [place], [place], [place]   1 year ago Encounter for [employer] annual wellness exam   [place], Jarome Matin, RPH-CPP   1 year ago Need for influenza vaccination   [place] Ladell Pier, MD   1 year ago No-show for appointment   [facility] Ladell Pier, MD              Passed - Patient is not pregnant      Passed - Last BP in normal range    BP Readings from Last 1 Encounters:  06/15/22 121/79

## 2022-07-02 NOTE — Telephone Encounter (Signed)
Called pt  to make appt. Called daughter for same [facility] for appt.

## 2022-07-11 ENCOUNTER — Telehealth: Payer: Self-pay

## 2022-07-11 ENCOUNTER — Ambulatory Visit: Payer: Medicare HMO | Attending: Internal Medicine

## 2022-07-11 NOTE — Telephone Encounter (Signed)
This nurse called interpreter service to assist in providing telephonic AWV. Service states that they do not service her language. I attempted to call patient to see if her English was good enough to do the visit. She stated that she wanted an interpreter. AWV was not performed.

## 2022-07-16 NOTE — Telephone Encounter (Signed)
Called & spoke to [name] the patient's daughter, listed under patient contact. Agreed to accompany patient to the appointment & interpret if necessary. Appointment scheduled for 08/12/2022.

## 2022-07-27 ENCOUNTER — Other Ambulatory Visit: Payer: Self-pay | Admitting: Internal Medicine

## 2022-07-27 DIAGNOSIS — I10 Essential (primary) hypertension: Secondary | ICD-10-CM

## 2022-08-03 ENCOUNTER — Other Ambulatory Visit: Payer: Self-pay | Admitting: Internal Medicine

## 2022-08-03 ENCOUNTER — Other Ambulatory Visit: Payer: Self-pay | Admitting: Physician Assistant

## 2022-08-03 DIAGNOSIS — E782 Mixed hyperlipidemia: Secondary | ICD-10-CM

## 2022-08-03 DIAGNOSIS — K219 Gastro-esophageal reflux disease without esophagitis: Secondary | ICD-10-CM

## 2022-08-05 NOTE — Telephone Encounter (Signed)
Requested Prescriptions  Pending Prescriptions Disp Refills   omeprazole (PRILOSEC) 40 MG capsule [Pharmacy Med Name: OMEPRAZOLE DR 40 MG CAPSULE] 90 capsule 0    Sig: TAKE 1 CAPSULE BY MOUTH EVERY DAY     Gastroenterology: Proton Pump Inhibitors Passed - 08/03/2022  7:27 AM      Passed - Valid encounter within last 12 months    Recent Outpatient Visits           7 months ago Flank pain   [place], [place], [place]   1 year ago Upper respiratory tract infection, unspecified type   [place], [place], [place]   1 year ago Encounter for [employer] annual wellness exam   [place], RPH-CPP   1 year ago Need for influenza vaccination   [place] Ladell Pier, MD   1 year ago No-show for appointment   [place], MD       Future Appointments             In 1 week Ladell Pier, MD [place]

## 2022-08-05 NOTE — Telephone Encounter (Signed)
Requested Prescriptions  Pending Prescriptions Disp Refills   atorvastatin (LIPITOR) 40 MG tablet [Pharmacy Med Name: ATORVASTATIN 40 MG TABLET] 90 tablet 1    Sig: TAKE 1 TABLET BY MOUTH EVERY DAY     Cardiovascular:  Antilipid - Statins Failed - 08/03/2022  7:27 AM      Failed - Lipid Panel in normal range within the last 12 months    Cholesterol, Total  Date Value Ref Range Status  12/20/2021 146 100 - 199 mg/dL Final   LDL Chol Calc (NIH)  Date Value Ref Range Status  12/20/2021 70 0 - 99 mg/dL Final   Direct LDL  Date Value Ref Range Status  03/27/2010 141.5 mg/dL Final    Comment:    See lab report for associated comment(s)   HDL  Date Value Ref Range Status  12/20/2021 53 >39 mg/dL Final   Triglycerides  Date Value Ref Range Status  12/20/2021 129 0 - 149 mg/dL Final         Passed - Patient is not pregnant      Passed - Valid encounter within last 12 months    Recent Outpatient Visits           7 months ago Flank pain   [place], [place]   1 year ago Upper respiratory tract infection, unspecified type   [place], [place], [place]   1 year ago Encounter for [employer] annual wellness exam   [place], RPH-CPP   1 year ago Need for influenza vaccination   [place] Ladell Pier, MD   1 year ago No-show for appointment   [place], MD       Future Appointments             In 1 week Ladell Pier, MD [place]

## 2022-08-12 ENCOUNTER — Encounter: Payer: Self-pay | Admitting: Internal Medicine

## 2022-08-12 ENCOUNTER — Ambulatory Visit: Payer: Medicare HMO | Attending: Internal Medicine | Admitting: Internal Medicine

## 2022-08-12 VITALS — BP 126/60 | HR 68 | Temp 98.0°F | Ht 63.0 in

## 2022-08-12 DIAGNOSIS — Z0000 Encounter for general adult medical examination without abnormal findings: Secondary | ICD-10-CM | POA: Diagnosis not present

## 2022-08-12 DIAGNOSIS — H9203 Otalgia, bilateral: Secondary | ICD-10-CM | POA: Diagnosis not present

## 2022-08-12 DIAGNOSIS — I10 Essential (primary) hypertension: Secondary | ICD-10-CM

## 2022-08-12 NOTE — Patient Instructions (Signed)
Please consider getting an updated COVID vaccine booster at your [facility].

## 2022-08-12 NOTE — Progress Notes (Signed)
Subjective:   Jacqueline Orozco is a 72 y.o. female who presents for Medicare Annual (Subsequent) preventive examination. Hx of Chronic LBP due to disc ds, HTN, HL, L hydronephrosis s/p pyeloplasty 06/2016, chronic hep B (does not have active infection, abs present 2014), Migraines.  Ykeo from [facility] is with her and interprets  Review of Systems    ENT: Complains of drainage from both ears that occurs only at nights when she lays on 1 side or the other.  She feels it is pus that is draining from the ear.  Reports pain in the ears intermittently at times. GI: Reports intermittent constipation.  No blood in the stools.  She uses an over-the-counter product called Phillips stool softener when she is constipated with good results.   CV: Reports compliance with taking Norvasc 10 mg daily and Cozaar 75 mg daily.  Compliant with atorvastatin.  No chest pains or shortness of breath.    Objective:    Today's Vitals   08/12/22 1455 08/12/22 1600  BP: 132/87 126/60  Pulse: 68   Temp: 98 F (36.7 C)   TempSrc: Oral   SpO2: 99%   Height: '5\' 3"'$  (1.6 m)   PainSc: 5     Body mass index is 27.63 kg/m. General: Older female in NAD Ears: Both ear canal and tympanic membranes within normal limits Chest: Clear to auscultation bilaterally CVS: Regular rate and rhythm. Abdomen: Normal bowel sounds, nondistended, soft and nontender    08/12/2022    3:19 PM 05/17/2021   11:41 AM 04/13/2020    8:43 AM 07/15/2017    6:07 PM 01/19/2017    3:55 PM 12/23/2016   10:37 AM 10/22/2016   10:50 AM  Advanced Directives  Does Patient Have a Medical Advance Directive? No No No No No No No  Would patient like information on creating a medical advance directive? No - Patient declined No - Patient declined No - Patient declined No - Patient declined  No - Patient declined     Current Medications (verified) Outpatient Encounter Medications as of 08/12/2022  Medication Sig   acetaminophen (TYLENOL 8 HOUR) 650 MG CR tablet Take 1  tablet (650 mg total) by mouth every 8 (eight) hours as needed for pain.   amLODipine (NORVASC) 10 MG tablet TAKE 1 TABLET BY MOUTH EVERY DAY   atorvastatin (LIPITOR) 40 MG tablet TAKE 1 TABLET BY MOUTH EVERY DAY   cetirizine (ZYRTEC) 10 MG tablet TAKE 1 TABLET BY MOUTH EVERY DAY   diclofenac Sodium (VOLTAREN) 1 % GEL Apply 2 g topically 4 (four) times daily.   dicyclomine (BENTYL) 20 MG tablet Take 1 tablet (20 mg total) by mouth 3 (three) times daily before meals. prn   fluticasone (FLONASE) 50 MCG/ACT nasal spray SPRAY 2 SPRAYS INTO EACH NOSTRIL EVERY DAY   losartan (COZAAR) 50 MG tablet TAKE 1 AND 1/2 TABLETS BY MOUTH DAILY   methocarbamol (ROBAXIN) 500 MG tablet TAKE 1 TABLET BY MOUTH EVERY 8 HOURS AS NEEDED FOR MUSCLE SPASMS.   omeprazole (PRILOSEC) 40 MG capsule TAKE 1 CAPSULE BY MOUTH EVERY DAY   polyethylene glycol powder (GLYCOLAX/MIRALAX) powder Take 17 grams PO PRN   predniSONE (DELTASONE) 20 MG tablet Take 2 tablets (40 mg total) by mouth daily.   promethazine-dextromethorphan (PROMETHAZINE-DM) 6.25-15 MG/5ML syrup Take 5 mLs by mouth 4 (four) times daily as needed for cough.   [DISCONTINUED] benzonatate (TESSALON) 100 MG capsule Take 1 capsule (100 mg total) by mouth every 8 (eight) hours.  No facility-administered encounter medications on file as of 08/12/2022.    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   Blood transfusion without reported diagnosis    Cold sore    Fatty liver    Gastric AVM    GERD (gastroesophageal reflux disease)    HEPATITIS B, CHRONIC 03/28/2010   HYPERLIPIDEMIA 03/28/2010   HYPERTENSION 03/28/2010   IBS (irritable bowel syndrome)    Internal hemorrhoids    Lumbar disc disease 09/29/2013   PONV (postoperative nausea and vomiting)    headache also   Past Surgical History:   Procedure Laterality Date   COLONOSCOPY WITH PROPOFOL N/A 01/03/2015   Procedure: COLONOSCOPY WITH PROPOFOL;  Surgeon: Jerene Bears, MD;  Location: [facility];  Service: Gastroenterology;  Laterality: N/A;   CYSTOSCOPY W/ URETERAL STENT PLACEMENT Left 06/17/2016   Procedure: CYSTOSCOPY WITH RETROGRADE PYELOGRAM/URETERAL STENT PLACEMENT;  Surgeon: Raynelle Bring, MD;  Location: [facility];  Service: Urology;  Laterality: Left;   ESOPHAGOGASTRODUODENOSCOPY (EGD) WITH PROPOFOL N/A 01/03/2015   Procedure: ESOPHAGOGASTRODUODENOSCOPY (EGD) WITH PROPOFOL;  Surgeon: Jerene Bears, MD;  Location: [facility];  Service: Gastroenterology;  Laterality: N/A;   ESOPHAGOGASTRODUODENOSCOPY ENDOSCOPY     several times   HOT HEMOSTASIS N/A 01/03/2015   Procedure: HOT HEMOSTASIS (ARGON PLASMA COAGULATION/BICAP);  Surgeon: Jerene Bears, MD;  Location: [facility];  Service: Gastroenterology;  Laterality: N/A;   NO PAST SURGERIES     ROBOT ASSISTED PYELOPLASTY Left 06/17/2016   Procedure: XI ROBOTIC ASSISTED PYELOPLASTY;  Surgeon: Raynelle Bring, MD;  Location: [facility];  Service: Urology;  Laterality: Left;   Family History  Problem Relation Age of Onset   Hypertension Mother    Stomach cancer Father    Liver disease Maternal Uncle    Lung cancer Maternal Grandmother    Breast cancer Neg Hx    Social History   Socioeconomic History   Marital status: Married    Spouse name: Not on file   Number of children: 6   Years of education: 12    Highest education level: Not on file  Occupational History   Occupation: Unemployed   Tobacco Use   Smoking status: Never   Smokeless tobacco: Never  Substance and Sexual Activity   Alcohol use: Yes    Comment: occasional wine   Drug use: No   Sexual activity: Yes    Birth control/protection: None  Other Topics Concern   Not on file  Social History Narrative   From [place].   Lived in [place] since 1994.    Live with husband.   6 adult children.    Speaks some English  and reads some  [place].    Caffeine use: Coffee daily   Right handed   Social Determinants of Health   Financial Resource Strain: Medium Risk (08/12/2022)   Overall Financial Resource Strain (CARDIA)    Difficulty of Paying Living Expenses: Somewhat hard  Food Insecurity: Food Insecurity Present (08/12/2022)   Hunger Vital Sign    Worried About Running Out of Food in the Last Year: Often true    Ran Out of Food in the Last Year: Often true  Transportation Needs: No Transportation Needs (08/12/2022)   PRAPARE - Hydrologist (Medical): No    Lack of Transportation (Non-Medical): No  Physical Activity: Insufficiently Active (  08/12/2022)   Exercise Vital Sign    Days of Exercise per Week: 2 days    Minutes of Exercise per Session: 60 min  Stress: Stress Concern Present (08/12/2022)   [employer]    Feeling of Stress : To some extent  Social Connections: Moderately Isolated (08/12/2022)   Social Connection and Isolation Panel [NHANES]    Frequency of Communication with Friends and Family: More than three times a week    Frequency of Social Gatherings with Friends and Family: Once a week    Attends Religious Services: More than 4 times per year    Active Member of [employer] or Organizations: No    Attends Archivist Meetings: Never    Marital Status: Widowed    Tobacco Counseling Does not smoke   Clinical Intake:     Pain : 0-10 Pain Score: 5  Pain Location: Flank Pain Orientation: Right Pain Descriptors / Indicators: Throbbing Pain Frequency: Intermittent     Diabetes: No     Diabetic?No  Interpreter Needed?: Yes      Activities of Daily Living    08/12/2022    3:12 PM  In your present state of health, do you have any difficulty performing the following activities:  Hearing? 0  Vision? 1  Difficulty concentrating or making decisions? 0  Walking or climbing stairs? 1   Dressing or bathing? 1  Comment Pain on knee when dressing  Doing errands, shopping? 0  Preparing Food and eating ? N  Using the Toilet? N  In the past six months, have you accidently leaked urine? N  Do you have problems with loss of bowel control? N  Managing your Medications? N  Managing your Finances? N  Housekeeping or managing your Housekeeping? N    Patient Care Team: Ladell Pier, MD as PCP - General (Internal Medicine)  Indicate any recent Medical Services you may have received from other than [facility] providers in the past year (date may be approximate).     Assessment:   This is a routine wellness examination for Jacqueline Orozco.  Hearing/Vision screen -has difficulty hearing on the phone.  Puss drains out of ears when she lays on one side or the other Whisper test is normal. Has appt for eye exam 09/23/2022  Dietary issues and exercise activities discussed:  Good appetite She walks regularly for exercise.  Goals Addressed   None   Depression Screen    08/12/2022    3:30 PM 08/12/2022    3:29 PM 08/12/2022    3:05 PM 12/20/2021    9:25 AM 05/17/2021   11:42 AM 04/05/2021   11:07 AM 07/14/2020    9:05 AM  PHQ 2/9 Scores  PHQ - 2 Score 0 0 0 1 0 0 0  PHQ- 9 Score 0          Fall Risk    08/12/2022    3:19 PM 12/20/2021    9:25 AM 05/17/2021   11:41 AM 04/05/2021   11:06 AM 07/14/2020    9:04 AM  [place] in the past year? 0 0 0 0 0  Number falls in past yr: 0 0 0 0 0  Injury with Fall? 0 0 0 0 0  Risk for fall due to : No Fall Risks No Fall Risks  No Fall Risks   Follow up   Falls evaluation completed;Education provided;Falls prevention discussed      FALL RISK PREVENTION  PERTAINING TO THE HOME:  Any stairs in or around the home? No  If so, are there any without handrails?  NA Home free of loose throw rugs in walkways, pet beds, electrical cords, etc? Yes  Adequate lighting in your home to reduce risk of falls? Yes   ASSISTIVE DEVICES UTILIZED TO PREVENT  FALLS:  Life alert? No  Use of a cane, walker or w/c? No  Grab bars in the bathroom? Yes  Shower chair or bench in shower? No  Elevated toilet seat or a handicapped toilet? No   TIMED UP AND GO:  Was the test performed? Yes .  Length of time to ambulate 10 feet: 10 sec.   Gait steady and fast without use of assistive device  Cognitive Function:    08/12/2022    3:20 PM 05/17/2021   11:42 AM 04/13/2020    8:52 AM  MMSE - Mini Mental State Exam  Orientation to time '5 5 4  '$ Orientation to Place '5 5 5  '$ Registration '3 3 3  '$ Attention/ Calculation 5 5 0  Recall '3 3 3  '$ Language- name 2 objects '2 2 2  '$ Language- repeat '1 1 1  '$ Language- follow 3 step command 3 3 0  Language- read & follow direction 1 1 0  Write a sentence '1 1 1  '$ Copy design '1 1 1  '$ Total score '30 30 20        '$ Immunizations Immunization History  Administered Date(s) Administered   Influenza Split 04/01/2011, 03/12/2012   Influenza, High Dose Seasonal PF 03/23/2018   Influenza,inj,Quad PF,6+ Mos 04/11/2015, 03/11/2016, 03/13/2020, 04/05/2021   Influenza-Unspecified 03/27/2014   Moderna Sars-Covid-2 Vaccination 05/02/2020, 05/30/2020   Pneumococcal Conjugate-13 04/07/2018   Pneumococcal Polysaccharide-23 05/28/2019   Tdap 04/01/2011, 04/05/2021   Zoster Recombinat (Shingrix) 04/13/2020, 05/17/2021    TDAP status: Up to date  Flu Vaccine status: Up to date had in fall last yr [facility]  Pneumococcal vaccine status: Up to date  Covid-19 vaccine status: Information provided on how to obtain vaccines.   Qualifies for Shingles Vaccine? Yes   Zostavax completed No   Shingrix Completed?: Yes  Screening Tests Health Maintenance  Topic Date Due   INFLUENZA VACCINE  01/08/2022   COVID-19 Vaccine (3 - 2023-24 season) 02/08/2022   Medicare Annual Wellness (AWV)  08/12/2023   MAMMOGRAM  11/16/2023   COLONOSCOPY (Pts 45-42yr Insurance coverage will need to be confirmed)  01/02/2025   DTaP/Tdap/Td (3 - Td or Tdap)  04/06/2031   Pneumonia Vaccine 70 Years old  Completed   DEXA SCAN  Completed   Hepatitis C Screening  Completed   Zoster Vaccines- Shingrix  Completed   HPV VACCINES  Aged Out    Health Maintenance  Health Maintenance Due  Topic Date Due   INFLUENZA VACCINE  01/08/2022   COVID-19 Vaccine (3 - 2023-24 season) 02/08/2022    Colorectal cancer screening: Type of screening: Colonoscopy. Completed 12/2014. Repeat every 10 years  Mammogram status: Completed 11/2021. Repeat every year  Bone Density status: Completed 01/2020. Results reflect: Bone density results: OSTEOPENIA. Repeat every 5 years.  Lung Cancer Screening: (Low Dose CT Chest recommended if Age 70-80years, 30 pack-year currently smoking OR have quit w/in 15years.) does not qualify.   Lung Cancer Screening Referral: NA  Additional Screening:  Hepatitis C Screening: does qualify; Completed 04/2020  Vision Screening: Recommended annual ophthalmology exams for early detection of glaucoma and other disorders of the eye. Is the patient up to date with their  annual eye exam?  No  but has an upcoming appt Who is the provider or what is the name of the office in which the patient attends annual eye exams?  If pt is not established with a provider, would they like to be referred to a provider to establish care?  NA .   Dental Screening: Recommended annual dental exams for proper oral hygiene  Community Resource Referral / Chronic Care Management: CRR required this visit?  No   CCM required this visit?  No      Plan:    1. Encounter for Medicare annual wellness exam   2. Ear pain, bilateral Inform patient that exam today is normal.  Her whisper test is normal.  I do not see any signs of pus or fluid in the ear.  However given her complaint, we will refer to ENT for further evaluation. - Ambulatory referral to ENT  3. Essential hypertension Controlled.  She will continue Norvasc 10 mg daily and losartan 75 mg  daily.  I have personally reviewed and noted the following in the patient's chart:   Medical and social history Use of alcohol, tobacco or illicit drugs  Current medications and supplements including opioid prescriptions. Patient is not currently taking opioid prescriptions. Functional ability and status Nutritional status Physical activity Advanced directives List of other physicians Hospitalizations, surgeries, and ER visits in previous 12 months Vitals Screenings to include cognitive, depression, and falls Referrals and appointments  In addition, I have reviewed and discussed with patient certain preventive protocols, quality metrics, and best practice recommendations. A written personalized care plan for preventive services as well as general preventive health recommendations were provided to patient.     Karle Plumber, MD   08/12/2022

## 2022-08-26 ENCOUNTER — Other Ambulatory Visit: Payer: Self-pay | Admitting: Internal Medicine

## 2022-08-26 DIAGNOSIS — I10 Essential (primary) hypertension: Secondary | ICD-10-CM

## 2022-09-19 ENCOUNTER — Other Ambulatory Visit: Payer: Self-pay | Admitting: Internal Medicine

## 2022-09-19 DIAGNOSIS — I10 Essential (primary) hypertension: Secondary | ICD-10-CM

## 2022-09-19 NOTE — Telephone Encounter (Signed)
Requested Prescriptions  Pending Prescriptions Disp Refills   amLODipine (NORVASC) 10 MG tablet [Pharmacy Med Name: AMLODIPINE BESYLATE 10 MG TAB] 90 tablet 0    Sig: TAKE 1 TABLET BY MOUTH EVERY DAY     Cardiovascular: Calcium Channel Blockers 2 Passed - 09/19/2022  1:29 AM      Passed - Last BP in normal range    BP Readings from Last 1 Encounters:  08/12/22 126/60         Passed - Last Heart Rate in normal range    Pulse Readings from Last 1 Encounters:  08/12/22 68         Passed - Valid encounter within last 6 months    Recent Outpatient Visits           1 month ago Encounter for [employer] annual wellness exam   [facility] Marcine Matar, MD   9 months ago Flank pain   [facility] [place], [place], [place]   1 year ago Upper respiratory tract infection, unspecified type   [facility] [place], [place], [place]   1 year ago Encounter for [employer] annual wellness exam   [facility] [place], Cornelius Moras, RPH-CPP   1 year ago Need for influenza vaccination   [facility] Marcine Matar, MD       Future Appointments             In 2 months Laural Benes, Binnie Rail, MD [facility]

## 2022-09-23 DIAGNOSIS — H40003 Preglaucoma, unspecified, bilateral: Secondary | ICD-10-CM | POA: Diagnosis not present

## 2022-10-14 ENCOUNTER — Ambulatory Visit: Payer: Self-pay

## 2022-10-14 NOTE — Telephone Encounter (Signed)
  Chief Complaint: Lightheaded/ fatigue Symptoms: above Frequency: Since Thursday Pertinent Negatives: Patient denies vertigo Disposition: [] ED /[] Urgent Care (no appt availability in office) / [] Appointment(In office/virtual)/ []  [place] [facility]/ [] Home Care/ [] Refused Recommended Disposition /[x] [place] [facility]/ []  Follow-up with PCP Additional Notes: Spoke with pt's daughter [name]. Pt was with [name]. Pt had been a bit lightheaded and fatigued since Thursday.  Daughter took pt's blood pressure and found it to be 141/77 with a pulse of 59. No appts available in office. Pt will go to mobile unit tomorrow.   Daughter will continue to monitor and call back if needed. Daughter states she is a Engineer, civil (consulting). Reason for Disposition  [1] MODERATE dizziness (e.g., interferes with normal activities) AND [2] has NOT been evaluated by doctor (or NP/PA) for this  (Exception: Dizziness caused by heat exposure, sudden standing, or poor fluid intake.)  Answer Assessment - Initial Assessment Questions 1. DESCRIPTION: "Describe your dizziness."     Lightheaded -  2. LIGHTHEADED: "Do you feel lightheaded?" (e.g., somewhat faint, woozy, weak upon standing)     Lightheaded 3. VERTIGO: "Do you feel like either you or the room is spinning or tilting?" (i.e. vertigo)     no 4. SEVERITY: "How bad is it?"  "Do you feel like you are going to faint?" "Can you stand and walk?"   - MILD: Feels slightly dizzy, but walking normally.   - MODERATE: Feels unsteady when walking, but not falling; interferes with normal activities (e.g., school, work).   - SEVERE: Unable to walk without falling, or requires assistance to walk without falling; feels like passing out now.      mild 5. ONSET:  "When did the dizziness begin?"     Thursday 6. AGGRAVATING FACTORS: "Does anything make it worse?" (e.g., standing, change in head position)     Hard to sleep 7. HEART RATE: "Can you tell me your heart rate?" "How many beats in 15  seconds?"  (Note: not all patients can do this)        8. CAUSE: "What do you think is causing the dizziness?"     Unsure 9. RECURRENT SYMPTOM: "Have you had dizziness before?" If Yes, ask: "When was the last time?" "What happened that time?"     no 10. OTHER SYMPTOMS: "Do you have any other symptoms?" (e.g., fever, chest pain, vomiting, diarrhea, bleeding)       Fatigue - wants to sleep all the time  Protocols used: Dizziness - Lightheadedness-A-AH

## 2022-10-14 NOTE — Telephone Encounter (Signed)
Noted  

## 2022-10-15 ENCOUNTER — Other Ambulatory Visit: Payer: Self-pay

## 2022-10-15 ENCOUNTER — Ambulatory Visit (HOSPITAL_COMMUNITY)
Admission: EM | Admit: 2022-10-15 | Discharge: 2022-10-15 | Disposition: A | Discharge status: Home / Self Care | Payer: 59 | Attending: Family Medicine | Admitting: Family Medicine

## 2022-10-15 ENCOUNTER — Emergency Department (HOSPITAL_COMMUNITY)
Admission: EM | Admit: 2022-10-15 | Discharge: 2022-10-15 | Disposition: A | Discharge status: Home / Self Care | Payer: 59 | Attending: Emergency Medicine | Admitting: Emergency Medicine

## 2022-10-15 ENCOUNTER — Emergency Department (HOSPITAL_COMMUNITY): Payer: 59

## 2022-10-15 ENCOUNTER — Encounter (HOSPITAL_COMMUNITY): Payer: Self-pay

## 2022-10-15 DIAGNOSIS — R0789 Other chest pain: Secondary | ICD-10-CM | POA: Diagnosis not present

## 2022-10-15 DIAGNOSIS — Z91040 Latex allergy status: Secondary | ICD-10-CM | POA: Diagnosis not present

## 2022-10-15 DIAGNOSIS — R5383 Other fatigue: Secondary | ICD-10-CM | POA: Diagnosis not present

## 2022-10-15 DIAGNOSIS — R400 Somnolence: Secondary | ICD-10-CM

## 2022-10-15 DIAGNOSIS — I10 Essential (primary) hypertension: Secondary | ICD-10-CM | POA: Insufficient documentation

## 2022-10-15 DIAGNOSIS — R072 Precordial pain: Secondary | ICD-10-CM

## 2022-10-15 DIAGNOSIS — R079 Chest pain, unspecified: Secondary | ICD-10-CM | POA: Insufficient documentation

## 2022-10-15 DIAGNOSIS — Z79899 Other long term (current) drug therapy: Secondary | ICD-10-CM | POA: Insufficient documentation

## 2022-10-15 DIAGNOSIS — R29818 Other symptoms and signs involving the nervous system: Secondary | ICD-10-CM | POA: Diagnosis not present

## 2022-10-15 DIAGNOSIS — R519 Headache, unspecified: Secondary | ICD-10-CM | POA: Diagnosis not present

## 2022-10-15 DIAGNOSIS — M542 Cervicalgia: Secondary | ICD-10-CM | POA: Diagnosis not present

## 2022-10-15 DIAGNOSIS — R42 Dizziness and giddiness: Secondary | ICD-10-CM | POA: Diagnosis not present

## 2022-10-15 LAB — TSH: TSH: 1.065 u[IU]/mL (ref 0.350–4.500)

## 2022-10-15 LAB — CBC
HCT: 38.3 % (ref 36.0–46.0)
Hemoglobin: 12.3 g/dL (ref 12.0–15.0)
MCH: 27.2 pg (ref 26.0–34.0)
MCHC: 32.1 g/dL (ref 30.0–36.0)
MCV: 84.5 fL (ref 80.0–100.0)
Platelets: 175 10*3/uL (ref 150–400)
RBC: 4.53 MIL/uL (ref 3.87–5.11)
RDW: 13.3 % (ref 11.5–15.5)
WBC: 4 10*3/uL (ref 4.0–10.5)
nRBC: 0 % (ref 0.0–0.2)

## 2022-10-15 LAB — BASIC METABOLIC PANEL
Anion gap: 7 (ref 5–15)
BUN: 12 mg/dL (ref 8–23)
CO2: 24 mmol/L (ref 22–32)
Calcium: 9.2 mg/dL (ref 8.9–10.3)
Chloride: 104 mmol/L (ref 98–111)
Creatinine, Ser: 0.74 mg/dL (ref 0.44–1.00)
GFR, Estimated: >60 mL/min (ref >60)
Glucose, Bld: 102 mg/dL — ABNORMAL HIGH (ref 70–99)
Potassium: 4.2 mmol/L (ref 3.5–5.1)
Sodium: 135 mmol/L (ref 135–145)

## 2022-10-15 LAB — TROPONIN I (HIGH SENSITIVITY)
Troponin I (High Sensitivity): 2 ng/L (ref <18)
Troponin I (High Sensitivity): 2 ng/L (ref <18)

## 2022-10-15 MED ORDER — PROCHLORPERAZINE EDISYLATE 10 MG/2ML IJ SOLN
5.0000 mg | Freq: Once | INTRAMUSCULAR | Status: AC
  Administered 2022-10-15: 5 mg via INTRAVENOUS
  Filled 2022-10-15: qty 2

## 2022-10-15 MED ORDER — METHOCARBAMOL 500 MG PO TABS: 500.0000 mg | ORAL_TABLET | Freq: Three times a day (TID) | ORAL | 0 refills | Status: DC | PRN

## 2022-10-15 MED ORDER — MECLIZINE HCL 12.5 MG PO TABS: 12.5000 mg | ORAL_TABLET | Freq: Three times a day (TID) | ORAL | 0 refills | Status: DC | PRN

## 2022-10-15 MED ORDER — SODIUM CHLORIDE 0.9 % IV BOLUS
500.0000 mL | Freq: Once | INTRAVENOUS | Status: AC
Start: 2022-10-15 — End: 2022-10-15
  Administered 2022-10-15: 500 mL via INTRAVENOUS

## 2022-10-15 NOTE — ED Triage Notes (Signed)
Patient referred from urgent care. Has had left sided chest pain, moves up to the neck and left arm since Friday. Has been feeling nauseous and dizzy.

## 2022-10-15 NOTE — ED Provider Notes (Signed)
Blood pressure (!) 145/73, pulse 64, temperature 97.8 F (36.6 C), temperature source Oral, resp. rate 15, height 5\' 3"  (1.6 m), weight 69.9 kg, SpO2 98 %.  Assuming care from Dr. Rubin Payor.  In short, Jacqueline Orozco is a 72 y.o. female with a chief complaint of Chest Pain .  Refer to the original H&P for additional details.  The current plan of care is to follow up on labs and CT head.  06:40 PM  TSH negative. Troponin negative x 2.  No acute kidney injury.  No anemia.  Plan for meclizine and Robaxin to use at night along with topical medications.  Advised that she follow closely with her primary care doctor.  Discussed the drowsy side effects of this medication as well.    Maia Plan, MD 10/15/22 [phone]

## 2022-10-15 NOTE — ED Notes (Signed)
Patient is being discharged from the Urgent Care and sent to the Emergency Department via private vehicle . Per Dr Marlinda Mike, patient is in need of higher level of care due to chest pain. Patient is aware and verbalizes understanding of plan of care.  Vitals:   10/15/22 1253  BP: (!) 145/83  Pulse: 68  Resp: 16  Temp: 98 F (36.7 C)  SpO2: 94%

## 2022-10-15 NOTE — ED Provider Notes (Signed)
[facility]    CSN: [id number] Arrival date & time: 10/15/22  1240      History   Chief Complaint Chief Complaint  Patient presents with   Chest Pain    HPI Jacqueline Orozco is a 72 y.o. female.    Chest Pain  Here for feeling like it is hard to stay awake and very tired, and left neck pain radiating into her left chest.  She has not had any fever or vomiting or diarrhea.  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   Blood transfusion without reported diagnosis    Cold sore    Fatty liver    Gastric AVM    GERD (gastroesophageal reflux disease)    HEPATITIS B, CHRONIC 03/28/2010   HYPERLIPIDEMIA 03/28/2010   HYPERTENSION 03/28/2010   IBS (irritable bowel syndrome)    Internal hemorrhoids    Lumbar disc disease 09/29/2013   PONV (postoperative nausea and vomiting)    headache also    Patient Active Problem List   Diagnosis Date Noted   Primary osteoarthritis of both knees 05/28/2019   Motion sickness 08/26/2017   Meniere disease, left 07/25/2017   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   Anterolisthesis 12/24/2016   Allergic contact dermatitis due to adhesives 07/02/2016   Hydronephrosis with ureteropelvic junction (UPJ) obstruction 04/15/2016   Osteoarthritis of right wrist 07/28/2015   Gastric and duodenal angiodysplasia    Seasonal allergies 09/15/2014   IBS (irritable bowel syndrome) 06/20/2014   Lumbar disc disease 09/29/2013   Gastric AVM 12/29/2012   Hx of adenomatous colonic polyps 10/16/2012   Plantar fasciitis, right 05/19/2012   Cervical radiculitis 02/12/2012   Vertigo 05/22/2011   HEPATITIS B, CHRONIC 03/28/2010   HLD (hyperlipidemia) 03/28/2010   Essential hypertension 03/28/2010    Past Surgical History:  Procedure Laterality Date   COLONOSCOPY WITH PROPOFOL N/A 01/03/2015   Procedure: COLONOSCOPY WITH PROPOFOL;  Surgeon: Beverley Fiedler,  MD;  Location: [facility];  Service: Gastroenterology;  Laterality: N/A;   CYSTOSCOPY W/ URETERAL STENT PLACEMENT Left 06/17/2016   Procedure: CYSTOSCOPY WITH RETROGRADE PYELOGRAM/URETERAL STENT PLACEMENT;  Surgeon: Heloise Purpura, MD;  Location: [facility];  Service: Urology;  Laterality: Left;   ESOPHAGOGASTRODUODENOSCOPY (EGD) WITH PROPOFOL N/A 01/03/2015   Procedure: ESOPHAGOGASTRODUODENOSCOPY (EGD) WITH PROPOFOL;  Surgeon: Beverley Fiedler, MD;  Location: [facility];  Service: Gastroenterology;  Laterality: N/A;   ESOPHAGOGASTRODUODENOSCOPY ENDOSCOPY     several times   HOT HEMOSTASIS N/A 01/03/2015   Procedure: HOT HEMOSTASIS (ARGON PLASMA COAGULATION/BICAP);  Surgeon: Beverley Fiedler, MD;  Location: [facility];  Service: Gastroenterology;  Laterality: N/A;   NO PAST SURGERIES     ROBOT ASSISTED PYELOPLASTY Left 06/17/2016   Procedure: XI ROBOTIC ASSISTED PYELOPLASTY;  Surgeon: Heloise Purpura, MD;  Location: [facility];  Service: Urology;  Laterality: Left;    OB History   No obstetric history on file.      Home Medications    Prior to Admission medications   Medication Sig Start Date End Date Taking? Authorizing Provider  acetaminophen (TYLENOL 8 HOUR) 650 MG CR tablet Take 1 tablet (650 mg total) by mouth every 8 (eight) hours as needed for pain. 07/28/15   Funches, Gerilyn Nestle, MD  amLODipine (NORVASC) 10 MG tablet TAKE 1 TABLET BY MOUTH EVERY DAY 09/19/22   Marcine Matar, MD  atorvastatin (LIPITOR) 40  MG tablet TAKE 1 TABLET BY MOUTH EVERY DAY 08/05/22   Marcine Matar, MD  cetirizine (ZYRTEC) 10 MG tablet TAKE 1 TABLET BY MOUTH EVERY DAY 02/06/22   Marcine Matar, MD  diclofenac Sodium (VOLTAREN) 1 % GEL Apply 2 g topically 4 (four) times daily. 12/20/21   Anders Simmonds, PA-C  dicyclomine (BENTYL) 20 MG tablet Take 1 tablet (20 mg total) by mouth 3 (three) times daily before meals. prn 12/20/21   Anders Simmonds, PA-C  fluticasone [facility]) 50 MCG/ACT nasal spray SPRAY 2 SPRAYS INTO  EACH NOSTRIL EVERY DAY 06/24/22   Marcine Matar, MD  losartan (COZAAR) 50 MG tablet TAKE 1 AND 1/2 TABLETS DAILY BY MOUTH 08/26/22   Marcine Matar, MD  methocarbamol (ROBAXIN) 500 MG tablet TAKE 1 TABLET BY MOUTH EVERY 8 HOURS AS NEEDED FOR MUSCLE SPASMS. 02/06/22   Marcine Matar, MD  omeprazole (PRILOSEC) 40 MG capsule TAKE 1 CAPSULE BY MOUTH EVERY DAY 08/05/22   Marcine Matar, MD  polyethylene glycol powder [facility]) powder Take 17 grams PO PRN 04/21/18   Marcine Matar, MD  predniSONE (DELTASONE) 20 MG tablet Take 2 tablets (40 mg total) by mouth daily. 06/15/22   Valinda Hoar, NP  promethazine-dextromethorphan (PROMETHAZINE-DM) 6.25-15 MG/5ML syrup Take 5 mLs by mouth 4 (four) times daily as needed for cough. 06/15/22   Valinda Hoar, NP    Family History Family History  Problem Relation Age of Onset   Hypertension Mother    Stomach cancer Father    Liver disease Maternal Uncle    Lung cancer Maternal Grandmother    Breast cancer Neg Hx     Social History Social History   Tobacco Use   Smoking status: Never   Smokeless tobacco: Never  Substance Use Topics   Alcohol use: Yes    Comment: occasional wine   Drug use: No     Allergies   Aspirin, Penicillins, Latex, Streptomycin, and Tramadol   Review of Systems Review of Systems  Cardiovascular:  Positive for chest pain.     Physical Exam Triage Vital Signs ED Triage Vitals  Enc Vitals Group     BP 10/15/22 1253 (!) 145/83     Pulse Rate 10/15/22 1253 68     Resp 10/15/22 1253 16     Temp 10/15/22 1253 98 F (36.7 C)     Temp Source 10/15/22 1253 Oral     SpO2 10/15/22 1253 94 %     Weight --      Height --      Head Circumference --      Peak Flow --      Pain Score 10/15/22 1255 6     Pain Loc --      Pain Edu? --      Excl. in GC? --    No data found.  Updated Vital Signs BP (!) 145/83 (BP Location: Left Arm)   Pulse 68   Temp 98 F (36.7 C) (Oral)   Resp 16    SpO2 94%   Visual Acuity Right Eye Distance:   Left Eye Distance:   Bilateral Distance:    Right Eye Near:   Left Eye Near:    Bilateral Near:     Physical Exam Vitals reviewed.  Constitutional:      General: She is not in acute distress.    Appearance: She is not diaphoretic.  HENT:     Mouth/Throat:  Mouth: Mucous membranes are moist.  Eyes:     Extraocular Movements: Extraocular movements intact.     Pupils: Pupils are equal, round, and reactive to light.  Neck:     Comments: There is tenderness of the left trapezius and sternocleidomastoid. Cardiovascular:     Rate and Rhythm: Regular rhythm.  Pulmonary:     Effort: Pulmonary effort is normal.     Breath sounds: Normal breath sounds.  Skin:    Coloration: Skin is not pale.  Neurological:     General: No focal deficit present.     Mental Status: She is alert and oriented to person, place, and time.  Psychiatric:        Behavior: Behavior normal.      UC Treatments / Results  Labs (all labs ordered are listed, but only abnormal results are displayed) Labs Reviewed - No data to display  EKG   Radiology No results found.  Procedures Procedures (including critical care time)  Medications Ordered in UC Medications - No data to display  Initial Impression / Assessment and Plan / UC Course  I have reviewed the triage vital signs and the nursing notes.  Pertinent labs & imaging results that were available during my care of the patient were reviewed by me and considered in my medical decision making (see chart for details).        EKG shows normal sinus rhythm and no ST changes.  I discussed with the patient and her daughter that I am concerned that she may have at least dehydration.  I have recommended that they proceed to the emergency room for urgent evaluation there.  She will go by private car with her daughter driving Final Clinical Impressions(s) / UC Diagnoses   Final diagnoses:  Somnolence   Neck pain  Precordial pain     Discharge Instructions      Please proceed to the emergency room   ED Prescriptions   None    PDMP not reviewed this encounter.   Zenia Resides, MD 10/15/22 1314

## 2022-10-15 NOTE — Discharge Instructions (Signed)
Please proceed to the emergency room.

## 2022-10-15 NOTE — Discharge Instructions (Signed)

## 2022-10-15 NOTE — ED Triage Notes (Signed)
Pt states cp for the past 5 days with dizziness.  States the pain starts in her neck and goes into her left chest and left arm. States she feels sleepy all the time.

## 2022-10-15 NOTE — ED Provider Notes (Signed)
[place] EMERGENCY DEPARTMENT AT [facility] Provider Note   CSN: [id number] Arrival date & time: 10/15/22  1337     History  Chief Complaint  Patient presents with   Chest Pain    Jacqueline Orozco is a 72 y.o. female.   Chest Pain Patient presents with chest pain headache and nonspecific.  States that a few days ago had dizziness.  States she has been sleepy since.  States feels tired.  Has a dull headache.  States that did not feel as if room was spinning but just felt "dizzy".  Has had some nausea.  Does not feel as if things are moving around.  Has had previous basilar migraines.  Is had headaches.  Headache goes from the head to the back of the neck and feels in both shoulders.  Seen at urgent care and sent here.  No history of coronary artery disease.  No trauma.  No numbness or weakness.  Patient states she has had some numbness in her left hand.  Not feeling weak.  States she has had this with headaches in the past.    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 Authorizing Provider  acetaminophen (TYLENOL 8 HOUR) 650 MG CR tablet Take 1 tablet (650 mg total) by mouth every 8 (eight) hours as needed for pain. 07/28/15   Funches, Gerilyn Nestle, MD  amLODipine (NORVASC) 10 MG tablet TAKE 1 TABLET BY MOUTH EVERY DAY 09/19/22   Marcine Matar, MD  atorvastatin (LIPITOR) 40 MG tablet TAKE 1 TABLET BY MOUTH EVERY DAY 08/05/22   Marcine Matar, MD  cetirizine (ZYRTEC) 10 MG tablet TAKE 1 TABLET BY MOUTH EVERY DAY 02/06/22   Marcine Matar, MD  diclofenac Sodium (VOLTAREN) 1 % GEL Apply 2 g topically 4 (four) times daily. 12/20/21   Anders Simmonds, PA-C  dicyclomine (BENTYL) 20 MG tablet Take 1 tablet (20 mg total) by mouth 3 (three) times daily before meals. prn 12/20/21   Anders Simmonds, PA-C  fluticasone [facility]) 50 MCG/ACT nasal spray SPRAY 2 SPRAYS INTO EACH NOSTRIL EVERY DAY 06/24/22   Marcine Matar, MD  losartan (COZAAR) 50 MG tablet TAKE 1 AND 1/2 TABLETS DAILY BY MOUTH 08/26/22   Marcine Matar, MD  methocarbamol (ROBAXIN) 500 MG tablet TAKE 1 TABLET BY MOUTH EVERY 8 HOURS AS NEEDED FOR MUSCLE SPASMS. 02/06/22   Marcine Matar, MD  omeprazole (PRILOSEC) 40 MG capsule TAKE 1 CAPSULE BY MOUTH EVERY DAY 08/05/22   Marcine Matar, MD  polyethylene glycol powder [facility]) powder Take 17 grams PO PRN 04/21/18   Marcine Matar, MD  predniSONE (DELTASONE) 20 MG tablet Take 2 tablets (40 mg total) by mouth daily. 06/15/22   White, Elita Boone, NP  promethazine-dextromethorphan (PROMETHAZINE-DM) 6.25-15 MG/5ML syrup Take 5 mLs by mouth 4 (four) times daily as  needed for cough. 06/15/22   Valinda Hoar, NP      Allergies    Aspirin, Penicillins, Latex, Streptomycin, and Tramadol    Review of Systems   Review of Systems  Cardiovascular:  Positive for chest pain.    Physical Exam Updated Vital Signs BP 139/82   Pulse 62   Temp 97.8 F (36.6 C) (Oral)   Resp 17   Ht 5\' 3"  (1.6 m)   Wt 69.9 kg   SpO2 99%   BMI 27.28 kg/m  Physical Exam Vitals and nursing note reviewed.  Eyes:     Extraocular Movements: Extraocular movements intact.  Cardiovascular:     Rate and Rhythm: Normal rate and regular rhythm.  Pulmonary:     Breath sounds: No wheezing or rhonchi.  Chest:     Chest wall: No tenderness.  Musculoskeletal:     Right lower leg: No edema.     Left lower leg: No edema.   Skin:    General: Skin is warm.  Neurological:     Mental Status: She is alert and oriented to person, place, and time.     Comments: Pupils react to.  Eye movements intact.  Sensation grossly intact bilateral upper extremities.  Good grip strength bilaterally.     ED Results / Procedures / Treatments   Labs (all labs ordered are listed, but only abnormal results are displayed) Labs Reviewed  BASIC METABOLIC PANEL  CBC  TSH  TROPONIN I (HIGH SENSITIVITY)    EKG EKG Interpretation  Date/Time:  Tuesday Oct 15 2022 13:44:19 EDT Ventricular Rate:  62 PR Interval:  152 QRS Duration: 79 QT Interval:  400 QTC Calculation: 407 R Axis:   71 Text Interpretation: Sinus rhythm Low voltage, precordial leads Confirmed by Benjiman Core [phone]) on 10/15/2022 2:27:49 PM  Radiology DG Chest 2 View  Result Date: 10/15/2022 CLINICAL DATA:  Chest pain for 5 days with dizziness. EXAM: CHEST - 2 VIEW COMPARISON:  Chest radiograph 10/02/2017 FINDINGS: The cardiomediastinal silhouette is normal There is no focal consolidation or pulmonary edema. There is no pleural effusion or pneumothorax There is no acute osseous abnormality. IMPRESSION: No radiographic evidence of acute cardiopulmonary process. Electronically Signed   By: Lesia Hausen M.D.   On: 10/15/2022 14:02    Procedures Procedures    Medications Ordered in ED Medications  sodium chloride 0.9 % bolus 500 mL (has no administration in time range)  prochlorperazine (COMPAZINE) injection 5 mg (has no administration in time range)    ED Course/ Medical Decision Making/ A&P                             Medical Decision Making Amount and/or Complexity of Data Reviewed Labs: ordered. Radiology: ordered.  Risk Prescription drug management.   Patient with few different complaints.  Headache going to her chest.  Dizziness.  Fatigue.  Numbness of left arm at times.  Seen in urgent care and sent here.  Differential diagnoses rather  long.  Includes stroke, migraine, coronary disease.  Cardiac cause felt less likely however.  EKG reassuring.  Will get head CT to evaluate for the different headache.  However will treat as potential migraine also.  Check basic blood work.  CBC reassuring.  Care turned over to Dr. Jacqulyn Bath.        Final Clinical Impression(s) / ED Diagnoses Final diagnoses:  None    Rx / DC Orders ED Discharge Orders  None         Benjiman Core, MD 10/15/22 [phone]

## 2022-10-16 ENCOUNTER — Ambulatory Visit: Payer: Self-pay | Admitting: *Deleted

## 2022-10-16 NOTE — Telephone Encounter (Signed)
Summary: dizzy   Patient stated she was at the ED on yesterday due to dizziness and this morning she is still dizzy and  very weak. She stays sleepy all the time. Please f/u with patient as there were no appts available for provider in May         Contacted [place] [employer] Interpreter: Shanda Bumps  Interpreter will be available at 10:30- [place] [phone] ( Call interpreter- then can contact patient)

## 2022-10-16 NOTE — Telephone Encounter (Signed)
Lindon Romp Interpreter # [phone]   Chief Complaint: dizziness Symptoms: dizziness, swimmy headed, slight HA, Frequency: yesterday Pertinent Negatives: Patient denies nausea, or pain Disposition: [] ED /[] Urgent Care (no appt availability in office) / [x] Appointment(In office/virtual)/ []  [facility]/ [] Home Care/ [] Refused Recommended Disposition /[]  Mobile Bus/ []  Follow-up with PCP Additional Notes: pt went to ED yesterday for dizziness and still having it today but not as bad. Wasn't able to get meclizine last night d/t pharmacy being closed so pt going today. Pt states she is unsure if dizziness if r/t HTN meds or not, scheduled [facility] FU tomorrow at 1550 with PCP. Advised pt to get meclizine today and if sx worsen to call back and can advise on what to do. Pt verbalized understanding.   Summary: dizzy   Patient stated she was at the ED on yesterday due to dizziness and this morning she is still dizzy and  very weak. She stays sleepy all the time. Please f/u with patient as there were no appts available for provider in May         Reason for Disposition  [1] MODERATE dizziness (e.g., interferes with normal activities) AND [2] has been evaluated by doctor (or NP/PA) for this  Answer Assessment - Initial Assessment Questions 1. DESCRIPTION: "Describe your dizziness."     dizziness 2. LIGHTHEADED: "Do you feel lightheaded?" (e.g., somewhat faint, woozy, weak upon standing)     Woozy at times, swimmy head  4. SEVERITY: "How bad is it?"  "Do you feel like you are going to faint?" "Can you stand and walk?"   - MILD: Feels slightly dizzy, but walking normally.   - MODERATE: Feels unsteady when walking, but not falling; interferes with normal activities (e.g., school, work).   - SEVERE: Unable to walk without falling, or requires assistance to walk without falling; feels like passing out now.      Mild to moderate  5. ONSET:  "When did the dizziness begin?"      Yesterday  6. AGGRAVATING FACTORS: "Does anything make it worse?" (e.g., standing, change in head position)     Standing or looking down  10. OTHER SYMPTOMS: "Do you have any other symptoms?" (e.g., fever, chest pain, vomiting, diarrhea, bleeding)       Seeing is better today, slight HA  Protocols used: Dizziness - Lightheadedness-A-AH

## 2022-10-17 ENCOUNTER — Ambulatory Visit: Payer: 59 | Attending: Internal Medicine | Admitting: Internal Medicine

## 2022-10-17 ENCOUNTER — Encounter: Payer: Self-pay | Admitting: Internal Medicine

## 2022-10-17 VITALS — BP 137/84 | HR 76 | Temp 98.9°F | Ht 64.0 in | Wt 156.4 lb

## 2022-10-17 DIAGNOSIS — R400 Somnolence: Secondary | ICD-10-CM | POA: Diagnosis not present

## 2022-10-17 DIAGNOSIS — L720 Epidermal cyst: Secondary | ICD-10-CM

## 2022-10-17 DIAGNOSIS — G8929 Other chronic pain: Secondary | ICD-10-CM | POA: Diagnosis not present

## 2022-10-17 DIAGNOSIS — M25512 Pain in left shoulder: Secondary | ICD-10-CM | POA: Diagnosis not present

## 2022-10-17 DIAGNOSIS — I10 Essential (primary) hypertension: Secondary | ICD-10-CM | POA: Diagnosis not present

## 2022-10-17 MED ORDER — AMLODIPINE BESYLATE 5 MG PO TABS: 5.0000 mg | ORAL_TABLET | Freq: Every day | ORAL | 1 refills | Status: AC

## 2022-10-17 MED ORDER — LOSARTAN POTASSIUM 100 MG PO TABS: 100.0000 mg | ORAL_TABLET | Freq: Every day | ORAL | 1 refills | Status: DC

## 2022-10-17 NOTE — Progress Notes (Signed)
Patient ID: Jacqueline Orozco, female    DOB: 28-Jul-1950  MRN: [id number]  CC: ER f/u   Subjective: Jacqueline Orozco is a 72 y.o. female who presents for ER f/u.  Daughter, Jacqueline Orozco, is with her. Her concerns today include:  Hx of Chronic LBP due to disc ds, HTN, HL, L hydronephrosis s/p pyeloplasty 06/2016, chronic hep B (does not have active infection, abs present 2014), Migraines.   Patient was seen in the emergency room 10/15/2022 with chest pain.  Patient states that day she felt dizzy and had pain on the left side of the head that came down the neck into the left arm and the left chest.  In ER, EKG revealed no acute changes, cardiac enzymes negative x 2, CBC was normal.  CAT scan of the head was negative for any acute changes.  She was discharged home in a stable condition. Daughter reports that patient always complains of pain in the left shoulder and over the left shoulder blade ever since she strained her muscles gardening in October of last year.  When she gardens she would be outside the whole day and tries to pull up roots of plants.  Daughter noticed that she had a lump over the left shoulder blade.  Patient states she has had it since November of last year.  It is painful at times.  Daughter also concerned that patient nods off during the day whenever she sits down.  She is concerned that Norvasc may be causing it.  Daughter states that she herself was on Norvasc and did not tolerate it because it made her tired. -Patient gets in bed around 12:30 AM.  She wakes up at 5:30 AM every morning to go to church.  She does not snore loud.  Patient Active Problem List   Diagnosis Date Noted   Primary osteoarthritis of both knees 05/28/2019   Motion sickness 08/26/2017   Meniere disease, left 07/25/2017   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   Anterolisthesis 12/24/2016   Allergic contact dermatitis due to adhesives 07/02/2016   Hydronephrosis with ureteropelvic junction (UPJ) obstruction 04/15/2016    Osteoarthritis of right wrist 07/28/2015   Gastric and duodenal angiodysplasia    Seasonal allergies 09/15/2014   IBS (irritable bowel syndrome) 06/20/2014   Lumbar disc disease 09/29/2013   Gastric AVM 12/29/2012   Hx of adenomatous colonic polyps 10/16/2012   Plantar fasciitis, right 05/19/2012   Cervical radiculitis 02/12/2012   Vertigo 05/22/2011   HEPATITIS B, CHRONIC 03/28/2010   HLD (hyperlipidemia) 03/28/2010   Essential hypertension 03/28/2010     Current Outpatient Medications on File Prior to Visit  Medication Sig Dispense Refill   acetaminophen (TYLENOL 8 HOUR) 650 MG CR tablet Take 1 tablet (650 mg total) by mouth every 8 (eight) hours as needed for pain. 90 tablet 1   atorvastatin (LIPITOR) 40 MG tablet TAKE 1 TABLET BY MOUTH EVERY DAY 90 tablet 1   cetirizine (ZYRTEC) 10 MG tablet TAKE 1 TABLET BY MOUTH EVERY DAY 90 tablet 0   diclofenac Sodium (VOLTAREN) 1 % GEL Apply 2 g topically 4 (four) times daily. 100 g 3   dicyclomine (BENTYL) 20 MG tablet Take 1 tablet (20 mg total) by mouth 3 (three) times daily before meals. prn 270 tablet 1   fluticasone (FLONASE) 50 MCG/ACT nasal spray SPRAY 2 SPRAYS INTO EACH NOSTRIL EVERY DAY 48 mL 2   meclizine (ANTIVERT) 12.5 MG tablet Take 1 tablet (12.5 mg total) by mouth 3 (three) times daily as needed  for dizziness. 30 tablet 0   methocarbamol (ROBAXIN) 500 MG tablet Take 1 tablet (500 mg total) by mouth every 8 (eight) hours as needed for muscle spasms. 20 tablet 0   omeprazole (PRILOSEC) 40 MG capsule TAKE 1 CAPSULE BY MOUTH EVERY DAY 90 capsule 0   polyethylene glycol powder (GLYCOLAX/MIRALAX) powder Take 17 grams PO PRN 255 g 4   predniSONE (DELTASONE) 20 MG tablet Take 2 tablets (40 mg total) by mouth daily. 10 tablet 0   promethazine-dextromethorphan (PROMETHAZINE-DM) 6.25-15 MG/5ML syrup Take 5 mLs by mouth 4 (four) times daily as needed for cough. 118 mL 0   No current facility-administered medications on file prior to visit.     Allergies  Allergen Reactions   Aspirin Other (See Comments)    stomach pain, stomach bleeding   Penicillins Nausea And Vomiting and Other (See Comments)    Dizzy Has patient had a PCN reaction causing immediate rash, facial/tongue/throat swelling, SOB or lightheadedness with hypotension: No Has patient had a PCN reaction causing severe rash involving mucus membranes or skin necrosis: No Has patient had a PCN reaction that required hospitalization; No Has patient had a PCN reaction occurring within the last 10 years: No If all of the above answers are "NO", then may proceed with Cephalosporin use.    Latex Itching   Streptomycin Nausea And Vomiting and Rash   Tramadol Nausea Only    Social History   Socioeconomic History   Marital status: Married    Spouse name: Not on file   Number of children: 6   Years of education: 12    Highest education level: Not on file  Occupational History   Occupation: Unemployed   Tobacco Use   Smoking status: Never   Smokeless tobacco: Never  Substance and Sexual Activity   Alcohol use: Yes    Comment: occasional wine   Drug use: No   Sexual activity: Yes    Birth control/protection: None  Other Topics Concern   Not on file  Social History Narrative   From [place].   Lived in [place] since 1994.    Live with husband.   6 adult children.    Speaks some English and reads some  [place].    Caffeine use: Coffee daily   Right handed   Social Determinants of Health   Financial Resource Strain: Medium Risk (08/12/2022)   Overall Financial Resource Strain (CARDIA)    Difficulty of Paying Living Expenses: Somewhat hard  Food Insecurity: Food Insecurity Present (08/12/2022)   Hunger Vital Sign    Worried About Running Out of Food in the Last Year: Often true    Ran Out of Food in the Last Year: Often true  Transportation Needs: No Transportation Needs (08/12/2022)   PRAPARE - Administrator, Civil Service (Medical): No    Lack of  Transportation (Non-Medical): No  Physical Activity: Insufficiently Active (08/12/2022)   Exercise Vital Sign    Days of Exercise per Week: 2 days    Minutes of Exercise per Session: 60 min  Stress: Stress Concern Present (08/12/2022)   [employer] of Occupational Health - Occupational Stress Questionnaire    Feeling of Stress : To some extent  Social Connections: Moderately Isolated (08/12/2022)   Social Connection and Isolation Panel [NHANES]    Frequency of Communication with Friends and Family: More than three times a week    Frequency of Social Gatherings with Friends and Family: Once a week    Attends Religious  Services: More than 4 times per year    Active Member of Clubs or Organizations: No    Attends Banker Meetings: Never    Marital Status: Widowed  Intimate Partner Violence: Not At Risk (08/12/2022)   Humiliation, Afraid, Rape, and Kick questionnaire    Fear of Current or Ex-Partner: No    Emotionally Abused: No    Physically Abused: No    Sexually Abused: No    Family History  Problem Relation Age of Onset   Hypertension Mother    Stomach cancer Father    Liver disease Maternal Uncle    Lung cancer Maternal Grandmother    Breast cancer Neg Hx     Past Surgical History:  Procedure Laterality Date   COLONOSCOPY WITH PROPOFOL N/A 01/03/2015   Procedure: COLONOSCOPY WITH PROPOFOL;  Surgeon: Beverley Fiedler, MD;  Location: [facility];  Service: Gastroenterology;  Laterality: N/A;   CYSTOSCOPY W/ URETERAL STENT PLACEMENT Left 06/17/2016   Procedure: CYSTOSCOPY WITH RETROGRADE PYELOGRAM/URETERAL STENT PLACEMENT;  Surgeon: Heloise Purpura, MD;  Location: [facility];  Service: Urology;  Laterality: Left;   ESOPHAGOGASTRODUODENOSCOPY (EGD) WITH PROPOFOL N/A 01/03/2015   Procedure: ESOPHAGOGASTRODUODENOSCOPY (EGD) WITH PROPOFOL;  Surgeon: Beverley Fiedler, MD;  Location: [facility];  Service: Gastroenterology;  Laterality: N/A;   ESOPHAGOGASTRODUODENOSCOPY ENDOSCOPY      several times   HOT HEMOSTASIS N/A 01/03/2015   Procedure: HOT HEMOSTASIS (ARGON PLASMA COAGULATION/BICAP);  Surgeon: Beverley Fiedler, MD;  Location: [facility];  Service: Gastroenterology;  Laterality: N/A;   NO PAST SURGERIES     ROBOT ASSISTED PYELOPLASTY Left 06/17/2016   Procedure: XI ROBOTIC ASSISTED PYELOPLASTY;  Surgeon: Heloise Purpura, MD;  Location: [facility];  Service: Urology;  Laterality: Left;    ROS: Review of Systems Negative except as stated above  PHYSICAL EXAM: BP 137/84   Pulse 76   Temp 98.9 F (37.2 C)   Ht 5\' 4"  (1.626 m)   Wt 156 lb 6.4 oz (70.9 kg)   SpO2 97%   BMI 26.85 kg/m   Physical Exam  General appearance - alert, well appearing, older female and in no distress.  Patient gives the history herself. Mental status - normal mood, behavior, speech, dress, motor activity, and thought processes Musculoskeletal -good range of motion of the left shoulder.  Power in both upper extremities are 5/5 bilaterally.  No tenderness on palpation of the trapezius muscles over the left shoulder blade. Skin -noted to have soft movable mass 4 x 4 cm over the left shoulder blade more laterally.      Latest Ref Rng & Units 10/15/2022    1:46 PM 12/20/2021   10:00 AM 04/05/2021   11:33 AM  CMP  Glucose 70 - 99 mg/dL 161  89  85   BUN 8 - 23 mg/dL 12  8  10    Creatinine 0.44 - 1.00 mg/dL 0.96  0.45  4.09   Sodium 135 - 145 mmol/L 135  141  141   Potassium 3.5 - 5.1 mmol/L 4.2  4.1  3.8   Chloride 98 - 111 mmol/L 104  102  104   CO2 22 - 32 mmol/L 24  26  24    Calcium 8.9 - 10.3 mg/dL 9.2  9.9  9.7   Total Protein 6.0 - 8.5 g/dL  8.1  7.6   Total Bilirubin 0.0 - 1.2 mg/dL  0.4  0.6   Alkaline Phos 44 - 121 IU/L  98  89   AST 0 -  40 IU/L  23  17   ALT 0 - 32 IU/L  21  19    Lipid Panel     Component Value Date/Time   CHOL 146 12/20/2021 1000   TRIG 129 12/20/2021 1000   HDL 53 12/20/2021 1000   CHOLHDL 2.8 12/20/2021 1000   CHOLHDL 2.7 11/23/2015 1203   VLDL 24  11/23/2015 1203   LDLCALC 70 12/20/2021 1000   LDLDIRECT 141.5 03/27/2010 0953    CBC    Component Value Date/Time   WBC 4.0 10/15/2022 1346   RBC 4.53 10/15/2022 1346   HGB 12.3 10/15/2022 1346   HGB 13.2 12/20/2021 1000   HCT 38.3 10/15/2022 1346   HCT 41.5 12/20/2021 1000   PLT 175 10/15/2022 1346   PLT 199 12/20/2021 1000   MCV 84.5 10/15/2022 1346   MCV 84 12/20/2021 1000   MCH 27.2 10/15/2022 1346   MCHC 32.1 10/15/2022 1346   RDW 13.3 10/15/2022 1346   RDW 14.0 12/20/2021 1000   LYMPHSABS 1.9 12/20/2021 1000   MONOABS 0.3 01/19/2017 1615   EOSABS 0.2 12/20/2021 1000   BASOSABS 0.0 12/20/2021 1000    ASSESSMENT AND PLAN: 1. Essential hypertension Advised patient and daughter that I think her daytime sleepiness is most likely due to her not getting adequate sleep at nights not the amlodipine.  Should strive for 7 to 8 hours of sleep with currently only getting in about 5.  Does not have symptoms to suggest sleep apnea.  Patient and daughter still wants to change amlodipine to see whether that may be contributing.  We agreed to have her decrease the dose of amlodipine from 10 mg daily to 5 mg daily.  To make up for the decrease, we will increase the Cozaar instead from 75 mg daily to 100 mg daily.  Both expressed understanding of the plan. - losartan (COZAAR) 100 MG tablet; Take 1 tablet (100 mg total) by mouth daily.  Dispense: 90 tablet; Refill: 1 - amLODipine (NORVASC) 5 MG tablet; Take 1 tablet (5 mg total) by mouth daily.  Dispense: 90 tablet; Refill: 1  2. Epidermal cyst Patient states this is painful for her.  Will refer to dermatologist to have it removed. - Ambulatory referral to Dermatology  3. Daytime sleepiness See #1 above.  Encouraged her to try getting 7 to 8 hours of sleep at night.  If she plans to wake up 530 every morning to go to church, she should try get in bed around 9 PM.  4. Chronic left shoulder pain Recommend using Voltaren gel over-the-counter  along with Tylenol.    Patient was given the opportunity to ask questions.  Patient verbalized understanding of the plan and was able to repeat key elements of the plan.   This documentation was completed using Paediatric nurse.  Any transcriptional errors are unintentional.  Orders Placed This Encounter  Procedures   Ambulatory referral to Dermatology     Requested Prescriptions   Signed Prescriptions Disp Refills   losartan (COZAAR) 100 MG tablet 90 tablet 1    Sig: Take 1 tablet (100 mg total) by mouth daily.   amLODipine (NORVASC) 5 MG tablet 90 tablet 1    Sig: Take 1 tablet (5 mg total) by mouth daily.    No follow-ups on file.  Jonah Blue, MD, FACP

## 2022-10-17 NOTE — Patient Instructions (Signed)
Increase Losartan to 100 mg daily.  This means you will take two of the 50 mg tablets daily until you ran out of current bottle.  Decrease Amlodipine to 5 mg daily.

## 2022-10-18 ENCOUNTER — Telehealth: Payer: Self-pay

## 2022-10-18 NOTE — Transitions of Care (Post Inpatient/ED Visit) (Signed)
   10/18/2022  Name: Jacqueline Orozco MRN: [id number] DOB: June 21, 1950  Today's TOC FU Call Status: Today's TOC FU Call Status:: Unsuccessul Call (1st Attempt) Unsuccessful Call (1st Attempt) Date: 10/18/22  Attempted to reach the patient regarding the most recent Inpatient/ED visit. Contacted Interpreter [phone] ID: [email or web address].   Follow Up Plan: Additional outreach attempts will be made to reach the patient to complete the Transitions of Care (Post Inpatient/ED visit) call.    Antionette Fairy, RN,BSN,CCM [facility] Care Management Care Management Community Coordinator Direct Phone: [phone] Toll Free: [phone] Fax: [phone]

## 2022-10-22 ENCOUNTER — Telehealth: Payer: Self-pay

## 2022-10-22 NOTE — Transitions of Care (Post Inpatient/ED Visit) (Signed)
   10/22/2022  Name: Alexys Keziah MRN: [id number] DOB: September 25, 1950  Today's TOC FU Call Status: Today's TOC FU Call Status:: Unsuccessful Call (2nd Attempt) Unsuccessful Call (2nd Attempt) Date: 10/22/22  Red on EMMI-ED Discharge Alert Date & Reason:10/17/22 "Have d/c instructions? No"  Attempted to reach the patient regarding the most recent Inpatient/ED visit.  Follow Up Plan: Additional outreach attempts will be made to reach the patient to complete the Transitions of Care (Post Inpatient/ED visit) call.   Antionette Fairy, RN,BSN,CCM [facility] Care Management Care Management Community Coordinator Direct Phone: [phone] Toll Free: [phone] Fax: [phone]

## 2022-10-23 ENCOUNTER — Telehealth: Payer: Self-pay

## 2022-10-23 NOTE — Transitions of Care (Post Inpatient/ED Visit) (Signed)
   10/23/2022  Name: Keandrea Hinkle MRN: [id number] DOB: Oct 13, 1950  Today's TOC FU Call Status: Today's TOC FU Call Status:: Unsuccessful Call (3rd Attempt) Unsuccessful Call (3rd Attempt) Date: 10/23/22  Attempted to reach the patient regarding the most recent Inpatient/ED visit.  Follow Up Plan: No further outreach attempts will be made at this time. We have been unable to contact the patient.    Antionette Fairy, RN,BSN,CCM [facility] Care Management Care Management Community Coordinator Direct Phone: [phone] Toll Free: [phone] Fax: [phone]

## 2022-10-25 ENCOUNTER — Inpatient Hospital Stay: Payer: Medicare HMO | Admitting: Internal Medicine

## 2022-10-30 DIAGNOSIS — H9203 Otalgia, bilateral: Secondary | ICD-10-CM | POA: Diagnosis not present

## 2022-11-11 DIAGNOSIS — M722 Plantar fascial fibromatosis: Secondary | ICD-10-CM | POA: Diagnosis not present

## 2022-11-11 DIAGNOSIS — R5383 Other fatigue: Secondary | ICD-10-CM | POA: Diagnosis not present

## 2022-11-11 DIAGNOSIS — K219 Gastro-esophageal reflux disease without esophagitis: Secondary | ICD-10-CM | POA: Diagnosis not present

## 2022-11-11 DIAGNOSIS — I10 Essential (primary) hypertension: Secondary | ICD-10-CM | POA: Diagnosis not present

## 2022-11-11 DIAGNOSIS — E7800 Pure hypercholesterolemia, unspecified: Secondary | ICD-10-CM | POA: Diagnosis not present

## 2022-12-09 DIAGNOSIS — R5383 Other fatigue: Secondary | ICD-10-CM | POA: Diagnosis not present

## 2022-12-09 DIAGNOSIS — I10 Essential (primary) hypertension: Secondary | ICD-10-CM | POA: Diagnosis not present

## 2022-12-13 DIAGNOSIS — R222 Localized swelling, mass and lump, trunk: Secondary | ICD-10-CM | POA: Diagnosis not present

## 2022-12-16 ENCOUNTER — Ambulatory Visit: Payer: 59 | Admitting: Internal Medicine

## 2022-12-17 ENCOUNTER — Other Ambulatory Visit: Payer: Self-pay | Admitting: Internal Medicine

## 2022-12-17 DIAGNOSIS — K219 Gastro-esophageal reflux disease without esophagitis: Secondary | ICD-10-CM

## 2022-12-21 ENCOUNTER — Other Ambulatory Visit: Payer: Self-pay | Admitting: Internal Medicine

## 2022-12-21 DIAGNOSIS — I10 Essential (primary) hypertension: Secondary | ICD-10-CM

## 2022-12-23 NOTE — Telephone Encounter (Signed)
Unable to refill per protocol, Rx expired. Discontinued 10/17/22, dose change.  Requested Prescriptions  Pending Prescriptions Disp Refills   amLODipine (NORVASC) 10 MG tablet [Pharmacy Med Name: AMLODIPINE BESYLATE 10 MG TAB] 90 tablet 0    Sig: TAKE 1 TABLET BY MOUTH EVERY DAY     Cardiovascular: Calcium Channel Blockers 2 Passed - 12/21/2022  8:59 AM      Passed - Last BP in normal range    BP Readings from Last 1 Encounters:  10/17/22 137/84         Passed - Last Heart Rate in normal range    Pulse Readings from Last 1 Encounters:  10/17/22 76         Passed - Valid encounter within last 6 months    Recent Outpatient Visits           2 months ago Essential hypertension   [facility] Marcine Matar, MD   4 months ago Encounter for [employer] annual wellness exam   [facility] Marcine Matar, MD   1 year ago Flank pain   [facility] [place], [place], [place]   1 year ago Upper respiratory tract infection, unspecified type   [facility], Marzella Schlein, [place]   1 year ago Encounter for [employer] annual wellness exam   [facility] [place], Cornelius Moras, RPH-CPP

## 2022-12-24 DIAGNOSIS — D171 Benign lipomatous neoplasm of skin and subcutaneous tissue of trunk: Secondary | ICD-10-CM | POA: Diagnosis not present

## 2022-12-24 DIAGNOSIS — D179 Benign lipomatous neoplasm, unspecified: Secondary | ICD-10-CM | POA: Diagnosis not present

## 2023-01-07 DIAGNOSIS — Z4802 Encounter for removal of sutures: Secondary | ICD-10-CM | POA: Diagnosis not present

## 2023-01-09 ENCOUNTER — Other Ambulatory Visit: Payer: Self-pay | Admitting: Physician Assistant

## 2023-01-09 DIAGNOSIS — Z1231 Encounter for screening mammogram for malignant neoplasm of breast: Secondary | ICD-10-CM

## 2023-01-20 ENCOUNTER — Ambulatory Visit
Admission: RE | Admit: 2023-01-20 | Discharge: 2023-01-20 | Disposition: A | Discharge status: Home / Self Care | Payer: 59 | Source: Ambulatory Visit | Attending: Physician Assistant | Admitting: Physician Assistant

## 2023-01-20 DIAGNOSIS — Z1231 Encounter for screening mammogram for malignant neoplasm of breast: Secondary | ICD-10-CM | POA: Diagnosis not present

## 2023-01-24 ENCOUNTER — Other Ambulatory Visit: Payer: Self-pay | Admitting: Internal Medicine

## 2023-01-24 DIAGNOSIS — E782 Mixed hyperlipidemia: Secondary | ICD-10-CM

## 2023-02-07 ENCOUNTER — Other Ambulatory Visit: Payer: Self-pay | Admitting: Internal Medicine

## 2023-02-07 DIAGNOSIS — K219 Gastro-esophageal reflux disease without esophagitis: Secondary | ICD-10-CM

## 2023-02-07 DIAGNOSIS — I10 Essential (primary) hypertension: Secondary | ICD-10-CM

## 2023-02-11 ENCOUNTER — Other Ambulatory Visit: Payer: Self-pay | Admitting: Internal Medicine

## 2023-02-11 DIAGNOSIS — K219 Gastro-esophageal reflux disease without esophagitis: Secondary | ICD-10-CM

## 2023-02-18 DIAGNOSIS — E7800 Pure hypercholesterolemia, unspecified: Secondary | ICD-10-CM | POA: Diagnosis not present

## 2023-02-18 DIAGNOSIS — K5900 Constipation, unspecified: Secondary | ICD-10-CM | POA: Diagnosis not present

## 2023-02-18 DIAGNOSIS — K219 Gastro-esophageal reflux disease without esophagitis: Secondary | ICD-10-CM | POA: Diagnosis not present

## 2023-02-18 DIAGNOSIS — I10 Essential (primary) hypertension: Secondary | ICD-10-CM | POA: Diagnosis not present

## 2023-04-21 ENCOUNTER — Ambulatory Visit (HOSPITAL_COMMUNITY)
Admission: EM | Admit: 2023-04-21 | Discharge: 2023-04-21 | Disposition: A | Discharge status: Home / Self Care | Payer: 59 | Attending: Emergency Medicine | Admitting: Emergency Medicine

## 2023-04-21 ENCOUNTER — Encounter (HOSPITAL_COMMUNITY): Payer: Self-pay | Admitting: Emergency Medicine

## 2023-04-21 DIAGNOSIS — R0982 Postnasal drip: Secondary | ICD-10-CM | POA: Diagnosis not present

## 2023-04-21 DIAGNOSIS — J309 Allergic rhinitis, unspecified: Secondary | ICD-10-CM

## 2023-04-21 DIAGNOSIS — R058 Other specified cough: Secondary | ICD-10-CM

## 2023-04-21 MED ORDER — LEVOCETIRIZINE DIHYDROCHLORIDE 5 MG PO TABS: 5.0000 mg | ORAL_TABLET | Freq: Every evening | ORAL | 2 refills | Status: AC

## 2023-04-21 MED ORDER — DEXAMETHASONE SODIUM PHOSPHATE 10 MG/ML IJ SOLN
10.0000 mg | Freq: Once | INTRAMUSCULAR | Status: AC
  Administered 2023-04-21: 10 mg via INTRAMUSCULAR

## 2023-04-21 MED ORDER — AZELASTINE-FLUTICASONE 137-50 MCG/ACT NA SUSP: 1.0000 | Freq: Two times a day (BID) | NASAL | 2 refills | Status: AC

## 2023-04-21 MED ORDER — DEXAMETHASONE 10 MG/ML FOR PEDIATRIC ORAL USE
INTRAMUSCULAR | Status: AC
  Filled 2023-04-21: qty 1

## 2023-04-21 NOTE — Discharge Instructions (Signed)
To quickly address your allergies, you received an injection of steroid called Decadron.  This should provide you with significant relief over the next 12 hours.  I would like for you to begin taking allergy medications again.  I have sent prescriptions for Xyzal tablets and for Dymista nasal spray to your pharmacy.  Please take both as directed every day whether you feel that you are having symptoms or not.  Both of these medications work better when taken daily because they are preventative.  Thank you for visiting [facility] today.  We appreciate the opportunity to participate in your care.

## 2023-04-21 NOTE — ED Provider Notes (Signed)
[facility]    CSN: [id number] Arrival date & time: 04/21/23  1315    HISTORY   Chief Complaint  Patient presents with   Cough   HPI Jacqueline Orozco is a pleasant, 72 y.o. female who presents to urgent care today. Patient complains of cough intermittently productive of yellow to clear sputum, chest pain that radiates to her back when she is coughing, nasal congestion, runny nose and postnasal drip.  Patient states she is having a hard time coughing up mucus.  Patient reports a history of allergies, not currently taking any allergy medications.  Patient denies fever, body aches, chills, nausea, vomiting, diarrhea, known sick contacts.  The history is provided by the patient.   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   Blood transfusion without reported diagnosis    Cold sore    Fatty liver    Gastric AVM    GERD (gastroesophageal reflux disease)    HEPATITIS B, CHRONIC 03/28/2010   HYPERLIPIDEMIA 03/28/2010   HYPERTENSION 03/28/2010   IBS (irritable bowel syndrome)    Internal hemorrhoids    Lumbar disc disease 09/29/2013   PONV (postoperative nausea and vomiting)    headache also   Patient Active Problem List   Diagnosis Date Noted   Primary osteoarthritis of both knees 05/28/2019   Motion sickness 08/26/2017   Meniere disease, left 07/25/2017   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   Anterolisthesis 12/24/2016   Allergic contact dermatitis due to adhesives 07/02/2016   Hydronephrosis with ureteropelvic junction (UPJ) obstruction 04/15/2016   Osteoarthritis of right wrist 07/28/2015   Gastric and duodenal angiodysplasia    Seasonal allergies 09/15/2014   IBS (irritable bowel syndrome) 06/20/2014   Lumbar disc disease 09/29/2013   Gastric AVM 12/29/2012   Hx of adenomatous colonic polyps 10/16/2012   Plantar fasciitis, right 05/19/2012   Cervical radiculitis 02/12/2012   Vertigo 05/22/2011    HEPATITIS B, CHRONIC 03/28/2010   HLD (hyperlipidemia) 03/28/2010   Essential hypertension 03/28/2010   Past Surgical History:  Procedure Laterality Date   COLONOSCOPY WITH PROPOFOL N/A 01/03/2015   Procedure: COLONOSCOPY WITH PROPOFOL;  Surgeon: Beverley Fiedler, MD;  Location: [facility];  Service: Gastroenterology;  Laterality: N/A;   CYSTOSCOPY W/ URETERAL STENT PLACEMENT Left 06/17/2016   Procedure: CYSTOSCOPY WITH RETROGRADE PYELOGRAM/URETERAL STENT PLACEMENT;  Surgeon: Heloise Purpura, MD;  Location: [facility];  Service: Urology;  Laterality: Left;   ESOPHAGOGASTRODUODENOSCOPY (EGD) WITH PROPOFOL N/A 01/03/2015   Procedure: ESOPHAGOGASTRODUODENOSCOPY (EGD) WITH PROPOFOL;  Surgeon: Beverley Fiedler, MD;  Location: [facility];  Service: Gastroenterology;  Laterality: N/A;   ESOPHAGOGASTRODUODENOSCOPY ENDOSCOPY     several times   HOT HEMOSTASIS N/A 01/03/2015   Procedure: HOT HEMOSTASIS (ARGON PLASMA COAGULATION/BICAP);  Surgeon: Beverley Fiedler, MD;  Location: [facility];  Service: Gastroenterology;  Laterality: N/A;   NO PAST SURGERIES     ROBOT ASSISTED PYELOPLASTY Left 06/17/2016   Procedure: XI ROBOTIC ASSISTED PYELOPLASTY;  Surgeon: Heloise Purpura, MD;  Location: [facility];  Service: Urology;  Laterality: Left;   OB History   No obstetric history on file.    Home Medications    Prior to Admission medications   Medication Sig Start Date End Date Taking? Authorizing Provider  Azelastine-Fluticasone (DYMISTA) 137-50 MCG/ACT SUSP Place 1 spray into the nose every 12 (twelve) hours. 04/21/23 05/21/23 Yes Theadora Rama Scales, PA-C  levocetirizine (XYZAL) 5 MG tablet Take 1  tablet (5 mg total) by mouth every evening. 04/21/23 07/20/23 Yes Theadora Rama Scales, PA-C  acetaminophen (TYLENOL 8 HOUR) 650 MG CR tablet Take 1 tablet (650 mg total) by mouth every 8 (eight) hours as needed for pain. 07/28/15   Funches, Gerilyn Nestle, MD  amLODipine (NORVASC) 5 MG tablet Take 1 tablet (5 mg total) by mouth  daily. 10/17/22   Marcine Matar, MD  atorvastatin (LIPITOR) 40 MG tablet TAKE 1 TABLET BY MOUTH EVERY DAY 01/24/23   Marcine Matar, MD  diclofenac Sodium (VOLTAREN) 1 % GEL Apply 2 g topically 4 (four) times daily. 12/20/21   Anders Simmonds, PA-C  dicyclomine (BENTYL) 20 MG tablet Take 1 tablet (20 mg total) by mouth 3 (three) times daily before meals. prn 12/20/21   Anders Simmonds, PA-C  losartan (COZAAR) 100 MG tablet TAKE 1 TABLET BY MOUTH EVERY DAY 02/07/23   Marcine Matar, MD  polyethylene glycol powder (GLYCOLAX/MIRALAX) powder Take 17 grams PO PRN 04/21/18   Marcine Matar, MD    Family History Family History  Problem Relation Age of Onset   Hypertension Mother    Stomach cancer Father    Liver disease Maternal Uncle    Lung cancer Maternal Grandmother    Breast cancer Neg Hx    Social History Social History   Tobacco Use   Smoking status: Never   Smokeless tobacco: Never  Substance Use Topics   Alcohol use: Yes    Comment: occasional wine   Drug use: No   Allergies   Aspirin, Penicillins, Latex, Streptomycin, and Tramadol  Review of Systems Review of Systems Pertinent findings revealed after performing a 14 point review of systems has been noted in the history of present illness.  Physical Exam Vital Signs BP (!) 155/90 (BP Location: Right Arm)   Pulse 67   Temp 97.7 F (36.5 C) (Oral)   Resp 18   SpO2 97%   No data found.  Physical Exam Vitals and nursing note reviewed.  Constitutional:      General: She is not in acute distress.    Appearance: Normal appearance. She is not ill-appearing.  HENT:     Head: Normocephalic and atraumatic.     Salivary Glands: Right salivary gland is not diffusely enlarged or tender. Left salivary gland is not diffusely enlarged or tender.     Right Ear: Ear canal and external ear normal. No drainage. A middle ear effusion is present. There is no impacted cerumen. Tympanic membrane is bulging. Tympanic  membrane is not injected or erythematous.     Left Ear: Ear canal and external ear normal. No drainage. A middle ear effusion is present. There is no impacted cerumen. Tympanic membrane is bulging. Tympanic membrane is not injected or erythematous.     Ears:     Comments: Bilateral EACs normal, both TMs bulging with clear fluid    Nose: Rhinorrhea present. No nasal deformity, septal deviation, signs of injury, nasal tenderness, mucosal edema or congestion. Rhinorrhea is clear.     Right Nostril: Occlusion present. No foreign body, epistaxis or septal hematoma.     Left Nostril: Occlusion present. No foreign body, epistaxis or septal hematoma.     Right Turbinates: Enlarged, swollen and pale.     Left Turbinates: Enlarged, swollen and pale.     Right Sinus: No maxillary sinus tenderness or frontal sinus tenderness.     Left Sinus: No maxillary sinus tenderness or frontal sinus tenderness.  Mouth/Throat:     Lips: Pink. No lesions.     Mouth: Mucous membranes are moist. No oral lesions.     Pharynx: Oropharynx is clear. Uvula midline. No posterior oropharyngeal erythema or uvula swelling.     Tonsils: No tonsillar exudate. 0 on the right. 0 on the left.     Comments: Postnasal drip Eyes:     General: Lids are normal.        Right eye: No discharge.        Left eye: No discharge.     Extraocular Movements: Extraocular movements intact.     Conjunctiva/sclera: Conjunctivae normal.     Right eye: Right conjunctiva is not injected.     Left eye: Left conjunctiva is not injected.  Neck:     Trachea: Trachea and phonation normal.  Cardiovascular:     Rate and Rhythm: Normal rate and regular rhythm.     Pulses: Normal pulses.     Heart sounds: Normal heart sounds. No murmur heard.    No friction rub. No gallop.  Pulmonary:     Effort: Pulmonary effort is normal. No accessory muscle usage, prolonged expiration or respiratory distress.     Breath sounds: Normal breath sounds. No stridor,  decreased air movement or transmitted upper airway sounds. No decreased breath sounds, wheezing, rhonchi or rales.  Chest:     Chest wall: No tenderness.  Musculoskeletal:        General: Normal range of motion.     Cervical back: Normal range of motion and neck supple. Normal range of motion.  Lymphadenopathy:     Cervical: No cervical adenopathy.  Skin:    General: Skin is warm and dry.     Findings: No erythema or rash.  Neurological:     General: No focal deficit present.     Mental Status: She is alert and oriented to person, place, and time.  Psychiatric:        Mood and Affect: Mood normal.        Behavior: Behavior normal.     Visual Acuity Right Eye Distance:   Left Eye Distance:   Bilateral Distance:    Right Eye Near:   Left Eye Near:    Bilateral Near:     UC Couse / Diagnostics / Procedures:     Radiology No results found.  Procedures Procedures (including critical care time) EKG  Pending results:  Labs Reviewed - No data to display  Medications Ordered in UC: Medications  dexamethasone (DECADRON) injection 10 mg (10 mg Intramuscular Given 04/21/23 1536)    UC Diagnoses / Final Clinical Impressions(s)   I have reviewed the triage vital signs and the nursing notes.  Pertinent labs & imaging results that were available during my care of the patient were reviewed by me and considered in my medical decision making (see chart for details).    Final diagnoses:  Allergic rhinitis, unspecified seasonality, unspecified trigger  Non-productive cough  Post-nasal drip   Patient advised physical exam findings are concerning for uncontrolled allergies.  Patient provided with steroid injection during her visit and advised to begin levocetirizine and azelastine for further management of her allergies.  Please see discharge instructions below for details of plan of care as provided to patient. ED Prescriptions     Medication Sig Dispense Auth. Provider    levocetirizine (XYZAL) 5 MG tablet Take 1 tablet (5 mg total) by mouth every evening. 30 tablet Theadora Rama Scales, PA-C   Azelastine-Fluticasone [employer])  137-50 MCG/ACT SUSP Place 1 spray into the nose every 12 (twelve) hours. 23 g Theadora Rama Scales, PA-C      PDMP not reviewed this encounter.  Pending results:  Labs Reviewed - No data to display  Discharge Instructions:   Discharge Instructions      To quickly address your allergies, you received an injection of steroid called Decadron.  This should provide you with significant relief over the next 12 hours.  I would like for you to begin taking allergy medications again.  I have sent prescriptions for Xyzal tablets and for Dymista nasal spray to your pharmacy.  Please take both as directed every day whether you feel that you are having symptoms or not.  Both of these medications work better when taken daily because they are preventative.  Thank you for visiting [facility] today.  We appreciate the opportunity to participate in your care.    Disposition Upon Discharge:  Condition: stable for discharge home  Patient presented with an acute illness with associated systemic symptoms and significant discomfort requiring urgent management. In my opinion, this is a condition that a prudent lay person (someone who possesses an average knowledge of health and medicine) may potentially expect to result in complications if not addressed urgently such as respiratory distress, impairment of bodily function or dysfunction of bodily organs.   Routine symptom specific, illness specific and/or disease specific instructions were discussed with the patient and/or caregiver at length.   As such, the patient has been evaluated and assessed, work-up was performed and treatment was provided in alignment with urgent care protocols and evidence based medicine.  Patient/parent/caregiver has been advised that the patient may require  follow up for further testing and treatment if the symptoms continue in spite of treatment, as clinically indicated and appropriate.  Patient/parent/caregiver has been advised to return to the [facility] or PCP if no better; to PCP or the Emergency Department if new signs and symptoms develop, or if the current signs or symptoms continue to change or worsen for further workup, evaluation and treatment as clinically indicated and appropriate  The patient will follow up with their current PCP if and as advised. If the patient does not currently have a PCP we will assist them in obtaining one.   The patient may need specialty follow up if the symptoms continue, in spite of conservative treatment and management, for further workup, evaluation, consultation and treatment as clinically indicated and appropriate.  Patient/parent/caregiver verbalized understanding and agreement of plan as discussed.  All questions were addressed during visit.  Please see discharge instructions below for further details of plan.  This office note has been dictated using Teaching laboratory technician.  Unfortunately, this method of dictation can sometimes lead to typographical or grammatical errors.  I apologize for your inconvenience in advance if this occurs.  Please do not hesitate to reach out to me if clarification is needed.      Theadora Rama Scales, [place] 04/21/23 1954

## 2023-04-21 NOTE — ED Triage Notes (Addendum)
Pt c/o cough, congestion, chest pain that radiates to back from cough for over 1week. States she has had a hard time getting mucous up.

## 2023-04-29 DIAGNOSIS — K5900 Constipation, unspecified: Secondary | ICD-10-CM | POA: Diagnosis not present

## 2023-04-29 DIAGNOSIS — I10 Essential (primary) hypertension: Secondary | ICD-10-CM | POA: Diagnosis not present

## 2023-04-29 DIAGNOSIS — K219 Gastro-esophageal reflux disease without esophagitis: Secondary | ICD-10-CM | POA: Diagnosis not present

## 2023-07-23 DIAGNOSIS — I10 Essential (primary) hypertension: Secondary | ICD-10-CM | POA: Diagnosis not present

## 2023-08-12 DIAGNOSIS — K5900 Constipation, unspecified: Secondary | ICD-10-CM | POA: Diagnosis not present

## 2023-08-12 DIAGNOSIS — Z0000 Encounter for general adult medical examination without abnormal findings: Secondary | ICD-10-CM | POA: Diagnosis not present

## 2023-08-12 DIAGNOSIS — T148XXA Other injury of unspecified body region, initial encounter: Secondary | ICD-10-CM | POA: Diagnosis not present

## 2023-08-12 DIAGNOSIS — J019 Acute sinusitis, unspecified: Secondary | ICD-10-CM | POA: Diagnosis not present

## 2023-08-12 DIAGNOSIS — I10 Essential (primary) hypertension: Secondary | ICD-10-CM | POA: Diagnosis not present

## 2023-08-12 DIAGNOSIS — L0100 Impetigo, unspecified: Secondary | ICD-10-CM | POA: Diagnosis not present

## 2023-08-12 DIAGNOSIS — E782 Mixed hyperlipidemia: Secondary | ICD-10-CM | POA: Diagnosis not present

## 2023-08-12 DIAGNOSIS — K219 Gastro-esophageal reflux disease without esophagitis: Secondary | ICD-10-CM | POA: Diagnosis not present

## 2023-08-12 DIAGNOSIS — J301 Allergic rhinitis due to pollen: Secondary | ICD-10-CM | POA: Diagnosis not present

## 2023-08-12 DIAGNOSIS — Z1211 Encounter for screening for malignant neoplasm of colon: Secondary | ICD-10-CM | POA: Diagnosis not present

## 2024-01-15 ENCOUNTER — Other Ambulatory Visit: Payer: Self-pay | Admitting: Physician Assistant

## 2024-01-15 DIAGNOSIS — Z1231 Encounter for screening mammogram for malignant neoplasm of breast: Secondary | ICD-10-CM

## 2024-02-11 ENCOUNTER — Ambulatory Visit
Admission: RE | Admit: 2024-02-11 | Discharge: 2024-02-11 | Disposition: A | Discharge status: Home / Self Care | Source: Ambulatory Visit | Attending: Physician Assistant | Admitting: Physician Assistant

## 2024-02-11 DIAGNOSIS — Z1231 Encounter for screening mammogram for malignant neoplasm of breast: Secondary | ICD-10-CM

## 2024-03-10 DIAGNOSIS — J301 Allergic rhinitis due to pollen: Secondary | ICD-10-CM | POA: Diagnosis not present

## 2024-03-10 DIAGNOSIS — M159 Polyosteoarthritis, unspecified: Secondary | ICD-10-CM | POA: Diagnosis not present

## 2024-03-10 DIAGNOSIS — I10 Essential (primary) hypertension: Secondary | ICD-10-CM | POA: Diagnosis not present

## 2024-03-10 DIAGNOSIS — E782 Mixed hyperlipidemia: Secondary | ICD-10-CM | POA: Diagnosis not present

## 2024-03-10 DIAGNOSIS — K5900 Constipation, unspecified: Secondary | ICD-10-CM | POA: Diagnosis not present

## 2024-03-10 DIAGNOSIS — K219 Gastro-esophageal reflux disease without esophagitis: Secondary | ICD-10-CM | POA: Diagnosis not present

## 2024-04-07 DIAGNOSIS — M19042 Primary osteoarthritis, left hand: Secondary | ICD-10-CM | POA: Diagnosis not present

## 2024-04-07 DIAGNOSIS — Z23 Encounter for immunization: Secondary | ICD-10-CM | POA: Diagnosis not present

## 2024-04-07 DIAGNOSIS — I10 Essential (primary) hypertension: Secondary | ICD-10-CM | POA: Diagnosis not present
